# Patient Record
Sex: Male | Born: 1935
Health system: Southern US, Community
[De-identification: ages and names within clinical notes are randomized; demographics above are authoritative.]

## PROBLEM LIST (undated history)

## (undated) DIAGNOSIS — K219 Gastro-esophageal reflux disease without esophagitis: Secondary | ICD-10-CM

## (undated) DIAGNOSIS — M1990 Unspecified osteoarthritis, unspecified site: Secondary | ICD-10-CM

## (undated) DIAGNOSIS — I10 Essential (primary) hypertension: Secondary | ICD-10-CM

## (undated) DIAGNOSIS — J40 Bronchitis, not specified as acute or chronic: Secondary | ICD-10-CM

## (undated) DIAGNOSIS — N289 Disorder of kidney and ureter, unspecified: Secondary | ICD-10-CM

## (undated) DIAGNOSIS — I4891 Unspecified atrial fibrillation: Secondary | ICD-10-CM

## (undated) DIAGNOSIS — I509 Heart failure, unspecified: Secondary | ICD-10-CM

## (undated) DIAGNOSIS — E785 Hyperlipidemia, unspecified: Secondary | ICD-10-CM

## (undated) DIAGNOSIS — B029 Zoster without complications: Secondary | ICD-10-CM

## (undated) DIAGNOSIS — I2510 Atherosclerotic heart disease of native coronary artery without angina pectoris: Secondary | ICD-10-CM

## (undated) DIAGNOSIS — R252 Cramp and spasm: Secondary | ICD-10-CM

## (undated) DIAGNOSIS — E119 Type 2 diabetes mellitus without complications: Secondary | ICD-10-CM

## (undated) DIAGNOSIS — N189 Chronic kidney disease, unspecified: Secondary | ICD-10-CM

## (undated) HISTORY — DX: Atherosclerotic heart disease of native coronary artery without angina pectoris: I25.10

## (undated) HISTORY — DX: Essential (primary) hypertension: I10

## (undated) HISTORY — DX: Gastro-esophageal reflux disease without esophagitis: K21.9

## (undated) HISTORY — DX: Unspecified atrial fibrillation: I48.91

## (undated) HISTORY — DX: Cramp and spasm: R25.2

## (undated) HISTORY — DX: Bronchitis, not specified as acute or chronic: J40

## (undated) HISTORY — DX: Type 2 diabetes mellitus without complications: E11.9

## (undated) HISTORY — DX: Unspecified osteoarthritis, unspecified site: M19.90

## (undated) HISTORY — DX: Hyperlipidemia, unspecified: E78.5

## (undated) HISTORY — DX: Chronic kidney disease, unspecified: N18.9

## (undated) HISTORY — DX: Heart failure, unspecified: I50.9

## (undated) HISTORY — DX: Zoster without complications: B02.9

## (undated) HISTORY — PX: EYE SURGERY: SHX253

## (undated) HISTORY — DX: Disorder of kidney and ureter, unspecified: N28.9

---

## 1999-12-06 ENCOUNTER — Encounter: Payer: Self-pay | Admitting: Internal Medicine

## 1999-12-06 ENCOUNTER — Inpatient Hospital Stay (HOSPITAL_COMMUNITY): Admission: EM | Admit: 1999-12-06 | Discharge: 1999-12-09 | Payer: Self-pay | Admitting: Emergency Medicine

## 2005-01-23 ENCOUNTER — Inpatient Hospital Stay (HOSPITAL_COMMUNITY): Admission: EM | Admit: 2005-01-23 | Discharge: 2005-01-29 | Payer: Self-pay | Admitting: Emergency Medicine

## 2005-01-23 ENCOUNTER — Ambulatory Visit: Payer: Self-pay | Admitting: Cardiovascular Disease

## 2005-01-23 ENCOUNTER — Encounter (INDEPENDENT_AMBULATORY_CARE_PROVIDER_SITE_OTHER): Payer: Self-pay | Admitting: Interventional Cardiology

## 2005-01-29 ENCOUNTER — Encounter: Payer: Self-pay | Admitting: Cardiology

## 2005-02-02 ENCOUNTER — Ambulatory Visit: Payer: Self-pay | Admitting: Cardiovascular Disease

## 2005-02-19 ENCOUNTER — Ambulatory Visit: Payer: Self-pay | Admitting: Cardiology

## 2005-03-31 ENCOUNTER — Ambulatory Visit: Payer: Self-pay | Admitting: Internal Medicine

## 2005-04-07 ENCOUNTER — Ambulatory Visit: Payer: Self-pay | Admitting: Cardiovascular Disease

## 2005-04-20 HISTORY — PX: MITRAL VALVE REPAIR: SHX2039

## 2005-05-18 ENCOUNTER — Inpatient Hospital Stay (HOSPITAL_COMMUNITY): Admission: RE | Admit: 2005-05-18 | Discharge: 2005-05-25 | Payer: Self-pay | Admitting: Surgery

## 2005-05-18 HISTORY — PX: CORONARY ARTERY BYPASS GRAFT: SHX141

## 2005-06-05 ENCOUNTER — Ambulatory Visit: Payer: Self-pay | Admitting: Cardiovascular Disease

## 2005-06-24 ENCOUNTER — Ambulatory Visit: Payer: Self-pay | Admitting: Internal Medicine

## 2005-07-08 ENCOUNTER — Ambulatory Visit: Payer: Self-pay | Admitting: Cardiology

## 2005-07-17 ENCOUNTER — Ambulatory Visit: Payer: Self-pay | Admitting: Cardiovascular Disease

## 2005-07-23 ENCOUNTER — Ambulatory Visit: Payer: Self-pay | Admitting: Cardiovascular Disease

## 2005-07-30 ENCOUNTER — Ambulatory Visit: Payer: Self-pay | Admitting: Internal Medicine

## 2005-07-31 ENCOUNTER — Ambulatory Visit (HOSPITAL_COMMUNITY): Admission: RE | Admit: 2005-07-31 | Discharge: 2005-07-31 | Payer: Self-pay | Admitting: Cardiovascular Disease

## 2005-07-31 ENCOUNTER — Ambulatory Visit: Payer: Self-pay | Admitting: Internal Medicine

## 2005-08-07 ENCOUNTER — Ambulatory Visit: Payer: Self-pay | Admitting: Internal Medicine

## 2005-08-14 ENCOUNTER — Ambulatory Visit: Payer: Self-pay | Admitting: Cardiology

## 2005-08-21 ENCOUNTER — Ambulatory Visit: Payer: Self-pay | Admitting: Cardiology

## 2005-08-28 ENCOUNTER — Ambulatory Visit: Payer: Self-pay | Admitting: Internal Medicine

## 2005-09-04 ENCOUNTER — Ambulatory Visit: Payer: Self-pay | Admitting: Internal Medicine

## 2005-09-11 ENCOUNTER — Ambulatory Visit: Payer: Self-pay | Admitting: Cardiology

## 2005-09-28 ENCOUNTER — Ambulatory Visit: Payer: Self-pay | Admitting: Internal Medicine

## 2005-10-08 ENCOUNTER — Ambulatory Visit: Payer: Self-pay | Admitting: Cardiology

## 2005-10-15 ENCOUNTER — Ambulatory Visit: Payer: Self-pay | Admitting: Internal Medicine

## 2005-10-22 ENCOUNTER — Ambulatory Visit: Payer: Self-pay | Admitting: Internal Medicine

## 2005-10-23 ENCOUNTER — Ambulatory Visit: Payer: Self-pay | Admitting: Internal Medicine

## 2005-10-30 ENCOUNTER — Ambulatory Visit: Payer: Self-pay | Admitting: Internal Medicine

## 2005-11-02 HISTORY — PX: CARDIOVERSION: SHX1299

## 2005-11-03 ENCOUNTER — Ambulatory Visit: Payer: Self-pay | Admitting: Cardiovascular Disease

## 2005-11-03 ENCOUNTER — Ambulatory Visit (HOSPITAL_COMMUNITY): Admission: RE | Admit: 2005-11-03 | Discharge: 2005-11-03 | Payer: Self-pay | Admitting: Cardiovascular Disease

## 2005-11-11 ENCOUNTER — Ambulatory Visit: Payer: Self-pay | Admitting: Cardiology

## 2005-12-25 ENCOUNTER — Ambulatory Visit: Payer: Self-pay | Admitting: Internal Medicine

## 2006-01-01 ENCOUNTER — Ambulatory Visit: Payer: Self-pay | Admitting: Internal Medicine

## 2006-01-28 ENCOUNTER — Ambulatory Visit: Payer: Self-pay | Admitting: Cardiovascular Disease

## 2006-02-11 ENCOUNTER — Ambulatory Visit: Payer: Self-pay | Admitting: Internal Medicine

## 2006-02-23 ENCOUNTER — Ambulatory Visit: Payer: Self-pay | Admitting: Internal Medicine

## 2006-05-25 ENCOUNTER — Ambulatory Visit: Payer: Self-pay | Admitting: Internal Medicine

## 2006-05-25 LAB — CONVERTED CEMR LAB
ALT: 16 units/L (ref 0–40)
GFR calc Af Amer: 64 mL/min
GFR calc non Af Amer: 53 mL/min
Hgb A1c MFr Bld: 5.7 % (ref 4.6–6.0)
Potassium: 4.1 meq/L (ref 3.5–5.1)
Sodium: 144 meq/L (ref 135–145)
Total CHOL/HDL Ratio: 3
Triglycerides: 87 mg/dL (ref 0–149)
VLDL: 17 mg/dL (ref 0–40)

## 2006-05-31 ENCOUNTER — Ambulatory Visit: Payer: Self-pay | Admitting: Internal Medicine

## 2006-07-31 IMAGING — CR DG CHEST 2V
2 series · 2 of 2 positions shown · non-contrast
Comparison: 01/25/05.

CLINICAL DATA: Mitral regurgitation.  Coronary artery disease.
 CHEST - 2 VIEW:

[view not recorded (1 of 2)]
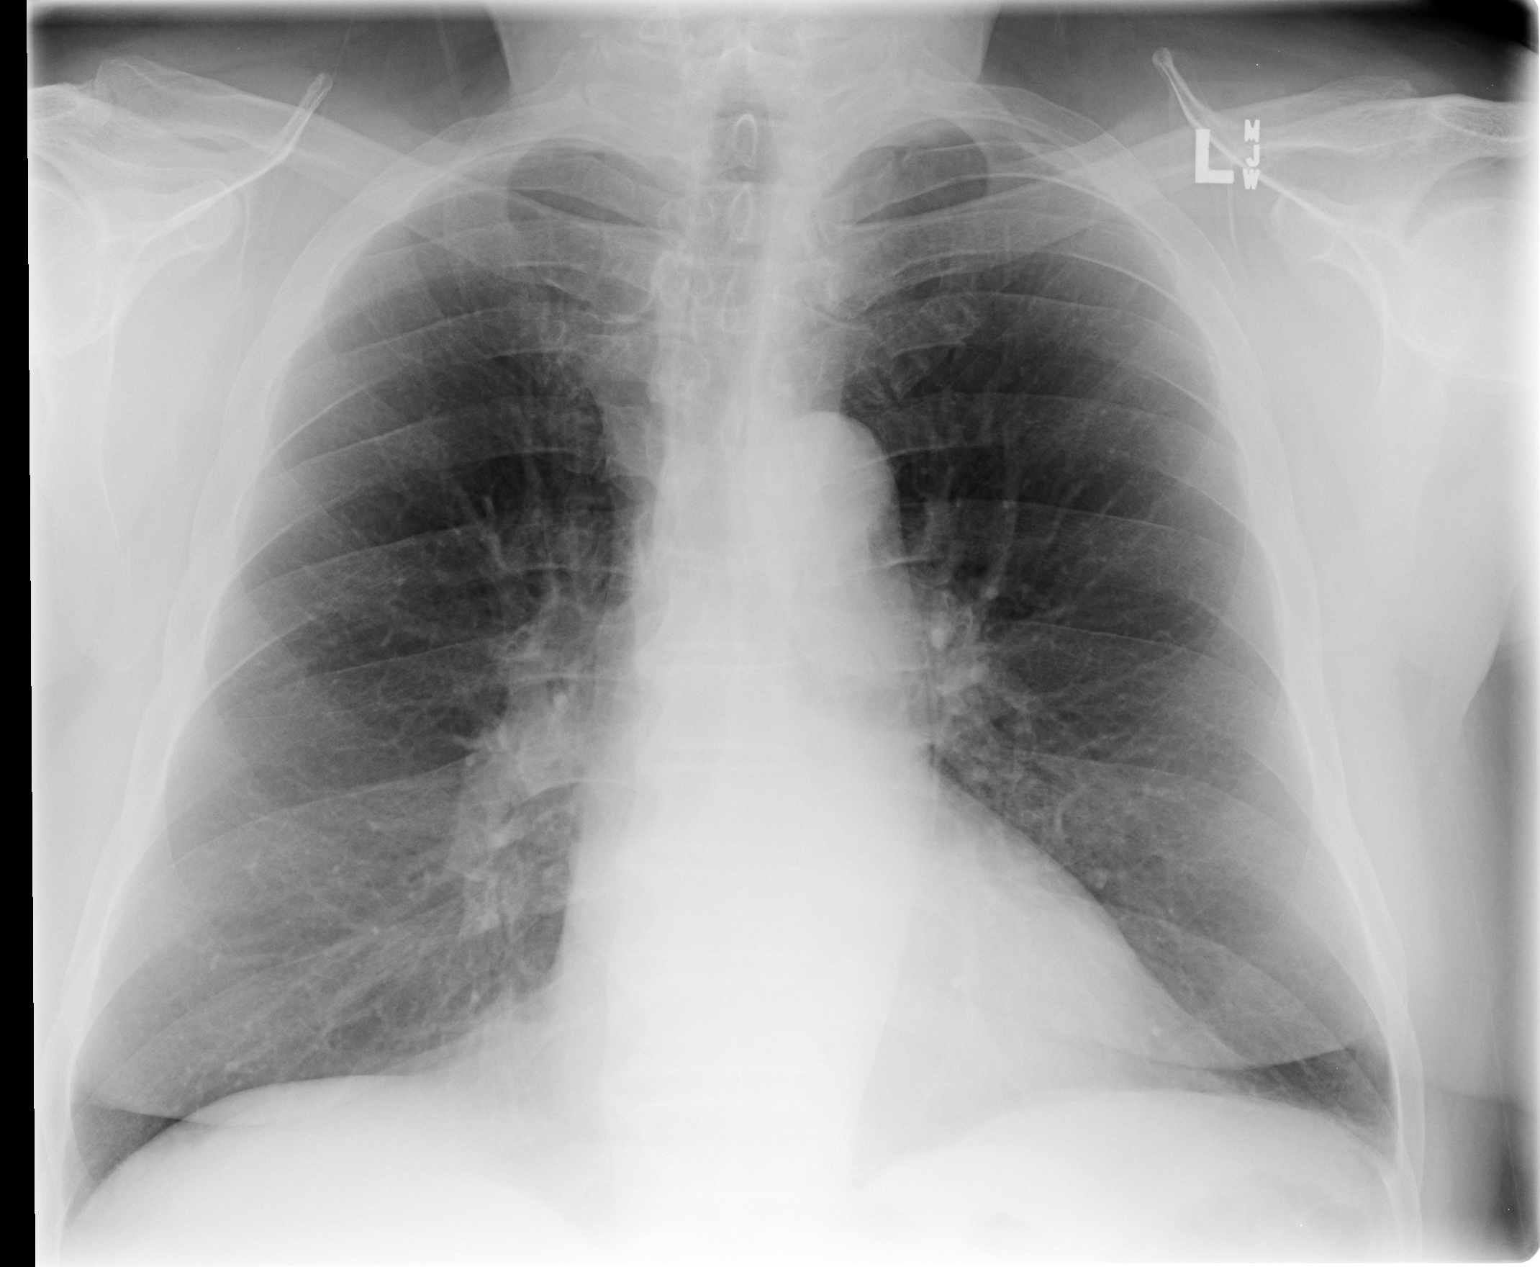

[view not recorded (2 of 2)]
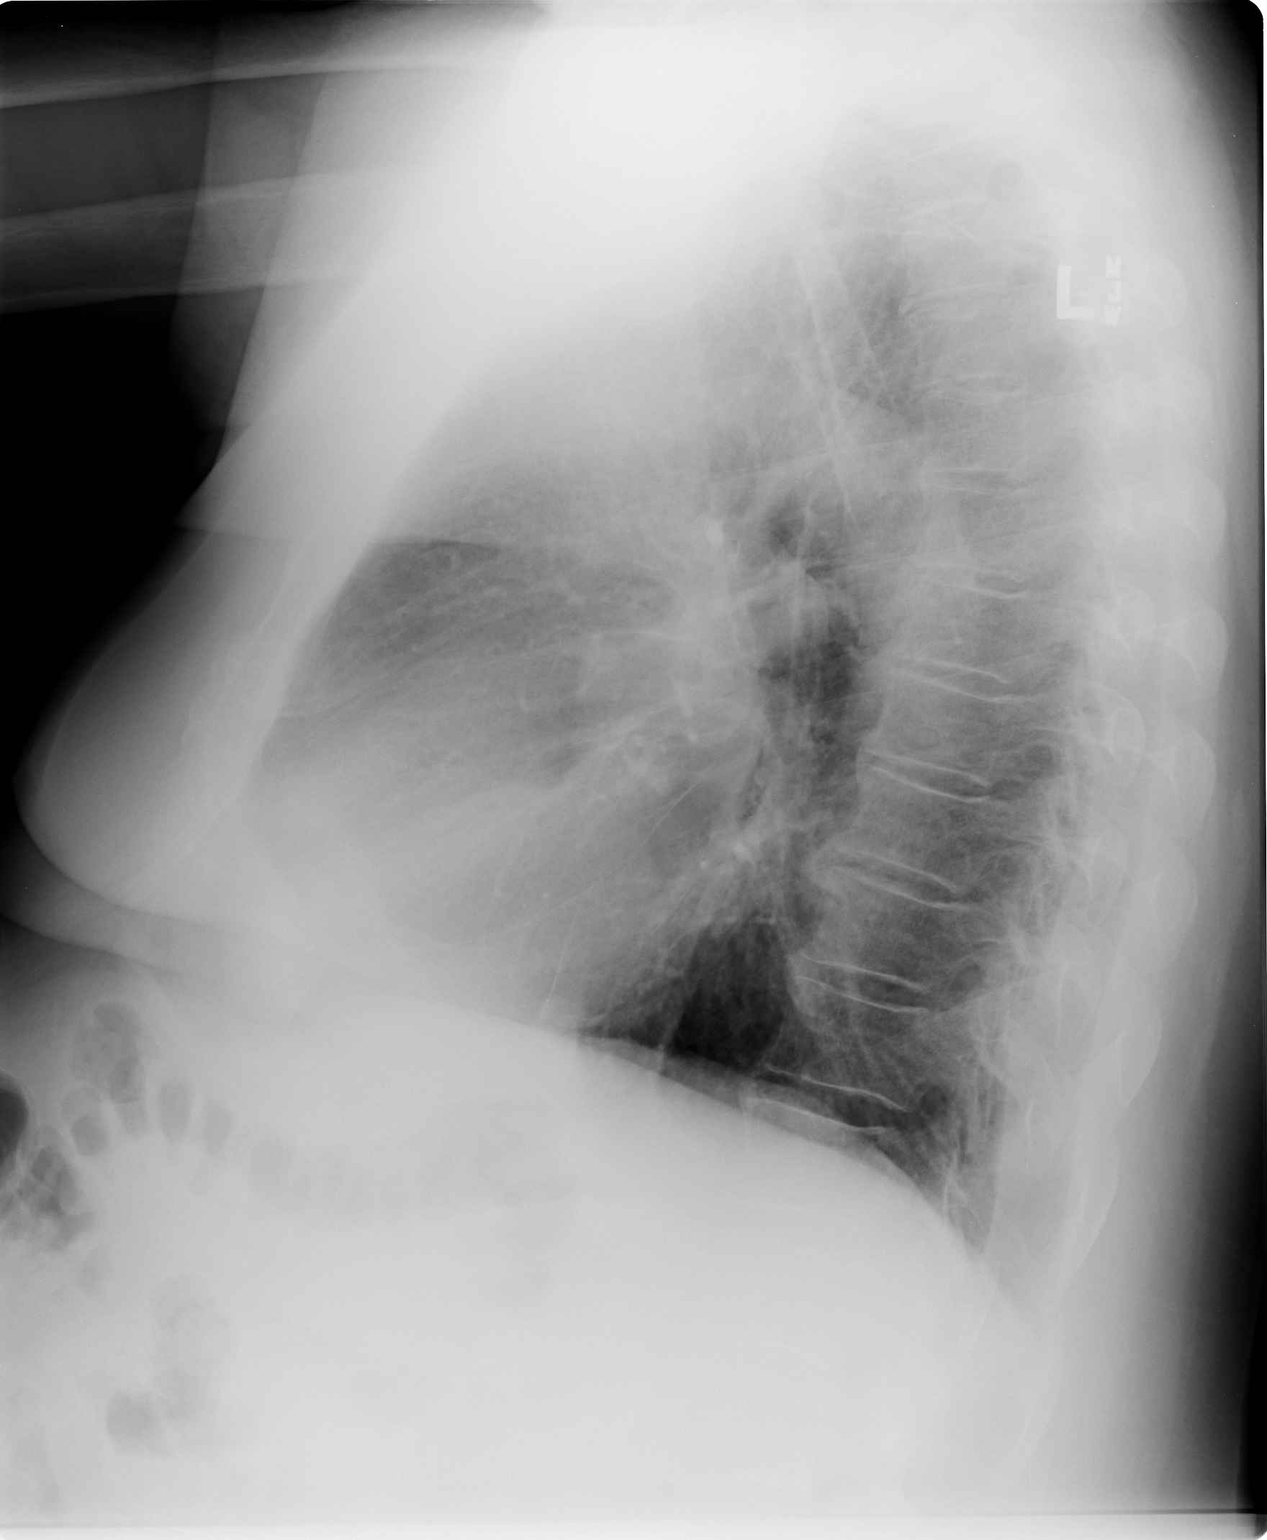

[2 of 2 positions shown; findings below may reference images not displayed]

FINDINGS: There are changes of COPD with hyperinflation.  There is no infiltrate, effusion, or mass.  There is no heart failure.
IMPRESSION: COPD.  No active disease.

## 2006-08-03 ENCOUNTER — Ambulatory Visit: Payer: Self-pay | Admitting: Internal Medicine

## 2006-10-05 ENCOUNTER — Ambulatory Visit: Payer: Self-pay | Admitting: Internal Medicine

## 2007-01-05 ENCOUNTER — Ambulatory Visit: Payer: Self-pay | Admitting: Internal Medicine

## 2007-01-05 LAB — CONVERTED CEMR LAB
ALT: 20 units/L (ref 0–53)
Basophils Absolute: 0.1 10*3/uL (ref 0.0–0.1)
Chloride: 108 meq/L (ref 96–112)
Creatinine, Ser: 1.3 mg/dL (ref 0.4–1.5)
Creatinine,U: 66.1 mg/dL
Eosinophils Absolute: 0.4 10*3/uL (ref 0.0–0.6)
GFR calc Af Amer: 69 mL/min
HCT: 41.7 % (ref 39.0–52.0)
HDL: 38.1 mg/dL — ABNORMAL LOW (ref >39.0)
Hemoglobin: 14.4 g/dL (ref 13.0–17.0)
MCV: 90.9 fL (ref 78.0–100.0)
Microalb Creat Ratio: 30.3 mg/g — ABNORMAL HIGH (ref 0.0–30.0)
Microalb, Ur: 2 mg/dL — ABNORMAL HIGH (ref 0.0–1.9)
Monocytes Relative: 7.8 % (ref 3.0–11.0)
Neutro Abs: 4.9 10*3/uL (ref 1.4–7.7)
Platelets: 214 10*3/uL (ref 150–400)
RBC: 4.59 M/uL (ref 4.22–5.81)
TSH: 1.38 microintl units/mL (ref 0.35–5.50)
WBC: 7.9 10*3/uL (ref 4.5–10.5)

## 2007-03-23 DIAGNOSIS — E118 Type 2 diabetes mellitus with unspecified complications: Secondary | ICD-10-CM

## 2007-03-23 DIAGNOSIS — N189 Chronic kidney disease, unspecified: Secondary | ICD-10-CM

## 2007-03-23 DIAGNOSIS — I2510 Atherosclerotic heart disease of native coronary artery without angina pectoris: Secondary | ICD-10-CM

## 2007-03-23 DIAGNOSIS — K219 Gastro-esophageal reflux disease without esophagitis: Secondary | ICD-10-CM

## 2007-03-23 DIAGNOSIS — M109 Gout, unspecified: Secondary | ICD-10-CM

## 2007-03-23 DIAGNOSIS — I10 Essential (primary) hypertension: Secondary | ICD-10-CM | POA: Insufficient documentation

## 2007-03-23 DIAGNOSIS — M1990 Unspecified osteoarthritis, unspecified site: Secondary | ICD-10-CM | POA: Insufficient documentation

## 2007-03-23 DIAGNOSIS — I5032 Chronic diastolic (congestive) heart failure: Secondary | ICD-10-CM

## 2007-03-23 DIAGNOSIS — B029 Zoster without complications: Secondary | ICD-10-CM | POA: Insufficient documentation

## 2007-05-03 ENCOUNTER — Ambulatory Visit: Payer: Self-pay | Admitting: Internal Medicine

## 2007-05-03 DIAGNOSIS — E782 Mixed hyperlipidemia: Secondary | ICD-10-CM

## 2007-07-04 ENCOUNTER — Ambulatory Visit: Payer: Self-pay | Admitting: Internal Medicine

## 2007-07-05 ENCOUNTER — Ambulatory Visit: Payer: Self-pay | Admitting: Internal Medicine

## 2007-07-05 DIAGNOSIS — J40 Bronchitis, not specified as acute or chronic: Secondary | ICD-10-CM

## 2007-07-05 LAB — CONVERTED CEMR LAB
CO2: 30 meq/L (ref 19–32)
Creatinine, Ser: 1.4 mg/dL (ref 0.4–1.5)
GFR calc Af Amer: 64 mL/min
GFR calc non Af Amer: 53 mL/min
Potassium: 4.6 meq/L (ref 3.5–5.1)

## 2007-09-29 ENCOUNTER — Ambulatory Visit: Payer: Self-pay | Admitting: Internal Medicine

## 2007-09-29 LAB — CONVERTED CEMR LAB
BUN: 24 mg/dL — ABNORMAL HIGH (ref 6–23)
CO2: 27 meq/L (ref 19–32)
Chloride: 106 meq/L (ref 96–112)
Creatinine, Ser: 1.3 mg/dL (ref 0.4–1.5)
GFR calc Af Amer: 69 mL/min
Glucose, Bld: 123 mg/dL — ABNORMAL HIGH (ref 70–99)
HDL: 35.5 mg/dL — ABNORMAL LOW (ref >39.0)
Hgb A1c MFr Bld: 6.7 % — ABNORMAL HIGH (ref 4.6–6.0)
LDL Cholesterol: 58 mg/dL (ref 0–99)
Potassium: 4.2 meq/L (ref 3.5–5.1)
Sodium: 141 meq/L (ref 135–145)
TSH: 0.92 microintl units/mL (ref 0.35–5.50)

## 2008-01-26 ENCOUNTER — Ambulatory Visit: Payer: Self-pay | Admitting: Internal Medicine

## 2008-05-21 ENCOUNTER — Ambulatory Visit: Payer: Self-pay | Admitting: Internal Medicine

## 2008-05-21 ENCOUNTER — Telehealth: Payer: Self-pay | Admitting: Internal Medicine

## 2008-05-21 LAB — CONVERTED CEMR LAB
CO2: 32 meq/L (ref 19–32)
Chloride: 102 meq/L (ref 96–112)
Glucose, Bld: 131 mg/dL — ABNORMAL HIGH (ref 70–99)
Hgb A1c MFr Bld: 6.7 % — ABNORMAL HIGH (ref 4.6–6.0)
Microalb Creat Ratio: 20.3 mg/g (ref 0.0–30.0)
Microalb, Ur: 1.5 mg/dL (ref 0.0–1.9)
Potassium: 5.2 meq/L — ABNORMAL HIGH (ref 3.5–5.1)
Sodium: 140 meq/L (ref 135–145)

## 2008-05-25 ENCOUNTER — Encounter: Payer: Self-pay | Admitting: Internal Medicine

## 2008-05-31 ENCOUNTER — Ambulatory Visit: Payer: Self-pay | Admitting: Internal Medicine

## 2008-05-31 DIAGNOSIS — R252 Cramp and spasm: Secondary | ICD-10-CM

## 2008-06-06 ENCOUNTER — Encounter: Payer: Self-pay | Admitting: Internal Medicine

## 2008-06-06 ENCOUNTER — Ambulatory Visit: Payer: Self-pay

## 2008-07-25 ENCOUNTER — Telehealth: Payer: Self-pay | Admitting: Internal Medicine

## 2008-08-20 ENCOUNTER — Ambulatory Visit: Payer: Self-pay | Admitting: Internal Medicine

## 2008-08-20 ENCOUNTER — Telehealth: Payer: Self-pay | Admitting: Internal Medicine

## 2008-08-20 LAB — CONVERTED CEMR LAB
AST: 19 units/L (ref 0–37)
BUN: 24 mg/dL — ABNORMAL HIGH (ref 6–23)
Calcium: 9.5 mg/dL (ref 8.4–10.5)
Cholesterol: 118 mg/dL (ref 0–200)
Creatinine, Ser: 1.5 mg/dL (ref 0.4–1.5)
Glucose, Bld: 118 mg/dL — ABNORMAL HIGH (ref 70–99)
Hgb A1c MFr Bld: 6.4 % (ref 4.6–6.5)
LDL Cholesterol: 54 mg/dL (ref 0–99)
Potassium: 4.2 meq/L (ref 3.5–5.1)
Sodium: 142 meq/L (ref 135–145)
Total CHOL/HDL Ratio: 3
VLDL: 29.4 mg/dL (ref 0.0–40.0)

## 2008-08-28 ENCOUNTER — Ambulatory Visit: Payer: Self-pay | Admitting: Internal Medicine

## 2009-02-28 ENCOUNTER — Ambulatory Visit: Payer: Self-pay | Admitting: Internal Medicine

## 2009-02-28 LAB — CONVERTED CEMR LAB
Calcium: 9.9 mg/dL (ref 8.4–10.5)
Creatinine, Urine: 76.8 mg/dL
Glucose, Bld: 125 mg/dL — ABNORMAL HIGH (ref 70–99)
Microalb Creat Ratio: 22.4 mg/g (ref 0.0–30.0)
Microalb, Ur: 1.72 mg/dL (ref 0.00–1.89)
Sodium: 141 meq/L (ref 135–145)

## 2009-03-03 ENCOUNTER — Encounter: Payer: Self-pay | Admitting: Internal Medicine

## 2009-08-20 ENCOUNTER — Ambulatory Visit: Payer: Self-pay | Admitting: Internal Medicine

## 2009-08-20 ENCOUNTER — Encounter (INDEPENDENT_AMBULATORY_CARE_PROVIDER_SITE_OTHER): Payer: Self-pay | Admitting: *Deleted

## 2009-08-20 DIAGNOSIS — I4891 Unspecified atrial fibrillation: Secondary | ICD-10-CM

## 2009-08-21 ENCOUNTER — Encounter: Payer: Self-pay | Admitting: Internal Medicine

## 2009-08-22 ENCOUNTER — Encounter: Payer: Self-pay | Admitting: Internal Medicine

## 2009-08-22 LAB — CONVERTED CEMR LAB: Prothrombin Time: 14.9 s (ref 11.6–15.2)

## 2009-08-23 ENCOUNTER — Telehealth: Payer: Self-pay | Admitting: Internal Medicine

## 2009-08-27 LAB — CONVERTED CEMR LAB
INR: 2.3 — ABNORMAL HIGH (ref <1.50)
Prothrombin Time: 25.1 s — ABNORMAL HIGH (ref 11.6–15.2)

## 2009-08-28 ENCOUNTER — Telehealth: Payer: Self-pay | Admitting: Internal Medicine

## 2009-08-29 ENCOUNTER — Ambulatory Visit: Payer: Self-pay | Admitting: Cardiovascular Disease

## 2009-08-29 DIAGNOSIS — Z9889 Other specified postprocedural states: Secondary | ICD-10-CM

## 2009-09-04 ENCOUNTER — Encounter: Payer: Self-pay | Admitting: Internal Medicine

## 2009-09-12 ENCOUNTER — Telehealth: Payer: Self-pay | Admitting: Internal Medicine

## 2009-09-12 ENCOUNTER — Ambulatory Visit: Payer: Self-pay | Admitting: Cardiology

## 2009-09-12 LAB — CONVERTED CEMR LAB: POC INR: 2.3

## 2009-09-19 ENCOUNTER — Ambulatory Visit: Payer: Self-pay | Admitting: Internal Medicine

## 2009-09-19 ENCOUNTER — Encounter (INDEPENDENT_AMBULATORY_CARE_PROVIDER_SITE_OTHER): Payer: Self-pay | Admitting: *Deleted

## 2009-09-19 ENCOUNTER — Ambulatory Visit (HOSPITAL_COMMUNITY): Admission: RE | Admit: 2009-09-19 | Discharge: 2009-09-19 | Payer: Self-pay | Admitting: Cardiovascular Disease

## 2009-09-19 ENCOUNTER — Ambulatory Visit: Payer: Self-pay

## 2009-09-19 ENCOUNTER — Ambulatory Visit: Payer: Self-pay | Admitting: Cardiovascular Disease

## 2009-09-19 LAB — CONVERTED CEMR LAB: POC INR: 2.2

## 2009-09-23 ENCOUNTER — Ambulatory Visit: Payer: Self-pay | Admitting: Internal Medicine

## 2009-09-26 ENCOUNTER — Ambulatory Visit: Payer: Self-pay | Admitting: Internal Medicine

## 2009-09-26 LAB — CONVERTED CEMR LAB: POC INR: 2.6

## 2009-10-01 ENCOUNTER — Ambulatory Visit: Payer: Self-pay | Admitting: Internal Medicine

## 2009-10-01 ENCOUNTER — Ambulatory Visit: Payer: Self-pay | Admitting: Cardiovascular Disease

## 2009-10-01 DIAGNOSIS — R0609 Other forms of dyspnea: Secondary | ICD-10-CM

## 2009-10-01 DIAGNOSIS — R0989 Other specified symptoms and signs involving the circulatory and respiratory systems: Secondary | ICD-10-CM

## 2009-10-01 LAB — CONVERTED CEMR LAB: POC INR: 3.5

## 2009-10-03 ENCOUNTER — Ambulatory Visit (HOSPITAL_COMMUNITY): Admission: RE | Admit: 2009-10-03 | Discharge: 2009-10-03 | Payer: Self-pay | Admitting: Cardiovascular Disease

## 2009-10-03 ENCOUNTER — Ambulatory Visit: Payer: Self-pay | Admitting: Cardiovascular Disease

## 2009-10-08 ENCOUNTER — Encounter: Payer: Self-pay | Admitting: Cardiovascular Disease

## 2009-10-10 ENCOUNTER — Ambulatory Visit: Payer: Self-pay | Admitting: Cardiovascular Disease

## 2009-10-10 ENCOUNTER — Telehealth: Payer: Self-pay | Admitting: Internal Medicine

## 2009-10-10 ENCOUNTER — Ambulatory Visit: Payer: Self-pay | Admitting: Internal Medicine

## 2009-10-10 LAB — CONVERTED CEMR LAB: POC INR: 2.6

## 2009-10-14 ENCOUNTER — Telehealth: Payer: Self-pay | Admitting: Internal Medicine

## 2009-10-18 ENCOUNTER — Encounter: Payer: Self-pay | Admitting: Internal Medicine

## 2009-10-30 ENCOUNTER — Encounter: Payer: Self-pay | Admitting: Internal Medicine

## 2009-11-07 ENCOUNTER — Ambulatory Visit: Payer: Self-pay | Admitting: Cardiovascular Disease

## 2009-11-07 ENCOUNTER — Encounter: Payer: Self-pay | Admitting: Cardiology

## 2009-11-07 ENCOUNTER — Ambulatory Visit: Payer: Self-pay | Admitting: Cardiology

## 2009-11-07 ENCOUNTER — Ambulatory Visit (HOSPITAL_COMMUNITY): Admission: RE | Admit: 2009-11-07 | Discharge: 2009-11-07 | Payer: Self-pay | Admitting: Cardiovascular Disease

## 2009-11-08 LAB — CONVERTED CEMR LAB
ALT: 17 units/L (ref 0–53)
Albumin: 4.5 g/dL (ref 3.5–5.2)
BUN: 29 mg/dL — ABNORMAL HIGH (ref 6–23)
CO2: 31 meq/L (ref 19–32)
Calcium: 9.6 mg/dL (ref 8.4–10.5)
Creatinine, Ser: 1.5 mg/dL (ref 0.4–1.5)
Glucose, Bld: 136 mg/dL — ABNORMAL HIGH (ref 70–99)
Potassium: 5.6 meq/L — ABNORMAL HIGH (ref 3.5–5.1)
Sodium: 146 meq/L — ABNORMAL HIGH (ref 135–145)
Total Protein: 7.7 g/dL (ref 6.0–8.3)

## 2009-11-11 ENCOUNTER — Ambulatory Visit: Payer: Self-pay | Admitting: Cardiovascular Disease

## 2009-11-15 LAB — CONVERTED CEMR LAB
Calcium: 8.9 mg/dL (ref 8.4–10.5)
Creatinine, Ser: 1.6 mg/dL — ABNORMAL HIGH (ref 0.4–1.5)
Potassium: 4.5 meq/L (ref 3.5–5.1)

## 2009-11-21 ENCOUNTER — Ambulatory Visit: Payer: Self-pay | Admitting: Internal Medicine

## 2009-11-21 ENCOUNTER — Ambulatory Visit: Payer: Self-pay | Admitting: Cardiovascular Disease

## 2009-11-21 LAB — CONVERTED CEMR LAB: POC INR: 3.5

## 2009-12-12 ENCOUNTER — Ambulatory Visit: Payer: Self-pay | Admitting: Internal Medicine

## 2009-12-16 ENCOUNTER — Encounter: Payer: Self-pay | Admitting: Internal Medicine

## 2009-12-16 ENCOUNTER — Ambulatory Visit (HOSPITAL_BASED_OUTPATIENT_CLINIC_OR_DEPARTMENT_OTHER): Admission: RE | Admit: 2009-12-16 | Discharge: 2009-12-16 | Payer: Self-pay | Admitting: Internal Medicine

## 2009-12-27 ENCOUNTER — Ambulatory Visit: Payer: Self-pay | Admitting: Pulmonary Disease

## 2010-01-03 ENCOUNTER — Telehealth: Payer: Self-pay | Admitting: Internal Medicine

## 2010-01-03 DIAGNOSIS — G4733 Obstructive sleep apnea (adult) (pediatric): Secondary | ICD-10-CM

## 2010-01-06 ENCOUNTER — Encounter (INDEPENDENT_AMBULATORY_CARE_PROVIDER_SITE_OTHER): Payer: Self-pay | Admitting: *Deleted

## 2010-01-09 ENCOUNTER — Ambulatory Visit: Payer: Self-pay | Admitting: Internal Medicine

## 2010-01-09 ENCOUNTER — Telehealth: Payer: Self-pay | Admitting: Internal Medicine

## 2010-02-06 ENCOUNTER — Ambulatory Visit: Payer: Self-pay | Admitting: Internal Medicine

## 2010-02-06 ENCOUNTER — Telehealth: Payer: Self-pay | Admitting: Internal Medicine

## 2010-02-13 ENCOUNTER — Ambulatory Visit: Payer: Self-pay | Admitting: Internal Medicine

## 2010-02-13 LAB — CONVERTED CEMR LAB
BUN: 25 mg/dL — ABNORMAL HIGH (ref 6–23)
Calcium: 9.3 mg/dL (ref 8.4–10.5)
Creatinine, Ser: 1.7 mg/dL — ABNORMAL HIGH (ref 0.40–1.50)
Glucose, Bld: 128 mg/dL — ABNORMAL HIGH (ref 70–99)
Hgb A1c MFr Bld: 6.6 % — ABNORMAL HIGH (ref <5.7)
Potassium: 4.8 meq/L (ref 3.5–5.3)
Sodium: 140 meq/L (ref 135–145)

## 2010-02-14 ENCOUNTER — Encounter: Payer: Self-pay | Admitting: Internal Medicine

## 2010-02-14 LAB — CONVERTED CEMR LAB: TSH: 1.791 microintl units/mL (ref 0.350–4.500)

## 2010-02-28 ENCOUNTER — Telehealth: Payer: Self-pay | Admitting: Internal Medicine

## 2010-03-06 ENCOUNTER — Ambulatory Visit: Payer: Self-pay | Admitting: Cardiology

## 2010-03-06 LAB — CONVERTED CEMR LAB: POC INR: 2.5

## 2010-04-02 ENCOUNTER — Telehealth: Payer: Self-pay | Admitting: Internal Medicine

## 2010-04-03 ENCOUNTER — Ambulatory Visit: Payer: Self-pay | Admitting: Cardiovascular Disease

## 2010-04-03 LAB — CONVERTED CEMR LAB: POC INR: 4

## 2010-04-24 ENCOUNTER — Ambulatory Visit: Admission: RE | Admit: 2010-04-24 | Discharge: 2010-04-24 | Payer: Self-pay | Source: Home / Self Care

## 2010-04-24 LAB — CONVERTED CEMR LAB: POC INR: 2

## 2010-05-05 ENCOUNTER — Telehealth: Payer: Self-pay | Admitting: Cardiovascular Disease

## 2010-05-08 ENCOUNTER — Ambulatory Visit: Admission: RE | Admit: 2010-05-08 | Discharge: 2010-05-08 | Payer: Self-pay | Source: Home / Self Care

## 2010-05-08 LAB — CONVERTED CEMR LAB: POC INR: 3.2

## 2010-05-18 LAB — CONVERTED CEMR LAB
CO2: 24 meq/L (ref 19–32)
Creatinine, Ser: 1.33 mg/dL (ref 0.40–1.50)
Free T4: 1.15 ng/dL (ref 0.80–1.80)
Glucose, Bld: 145 mg/dL — ABNORMAL HIGH (ref 70–99)
Potassium: 4.7 meq/L (ref 3.5–5.3)
Sodium: 139 meq/L (ref 135–145)

## 2010-05-20 NOTE — Progress Notes (Signed)
Summary: Coumadin Change  Phone Note Outgoing Call   Summary of Call: call pt -  I suggest he take 2 tabs of 4 mg of coumadin x 3 days then resume one daily.  needs INR on Mon or Tues Initial call taken by: D. Thomos Lemons DO,  Aug 23, 2009 8:02 AM  Follow-up for Phone Call        attempted to contact patient at [phone], wife Dennie Bible states patient was not available she takes his messages and manages his medication for, because he has trouble with his hearing. She was advised per Dr Artist Pais instructions, and was asked to have the patient call back to confirm that he has recieved this message. She states she will give him the information provided. Follow-up by: [name] CMA,  Aug 23, 2009 8:32 AM

## 2010-05-20 NOTE — Medication Information (Signed)
Summary: rov/ewj  Anticoagulant Therapy  Managed by: Weston Brass, PharmD Referring MD: Eden Emms PCP: Dondra Spry DO Supervising MD: Jens Som MD, Arlys John Indication 1: Atrial Fibrillation Lab Used: [facility] [place] Site: [address] INR POC 2.5 INR RANGE 2.5-3.0  Dietary changes: no    Health status changes: no    Bleeding/hemorrhagic complications: no    Recent/future hospitalizations: no    Any changes in medication regimen? no    Recent/future dental: no  Any missed doses?: no       Is patient compliant with meds? yes       Allergies: No Known Drug Allergies  Anticoagulation Management History:      The patient is taking warfarin and comes in today for a routine follow up visit.  Positive risk factors for bleeding include an age of 75 years or older and presence of serious comorbidities.  The bleeding index is 'intermediate risk'.  Positive CHADS2 values include History of CHF, History of HTN, Age > 57 years old, and History of Diabetes.  His last INR was 2.30.  Anticoagulation responsible provider: Jens Som MD, Arlys John.  INR POC: 2.5.  Cuvette Lot#: [id number].  Exp: 02/2011.    Anticoagulation Management Assessment/Plan:      The patient's current anticoagulation dose is Coumadin 4 mg tabs: Take as directed by [facility]madin Clinic (up to 1 1/2 tablets daily).  The target INR is 2.5-3.0.  The next INR is due 04/03/2010.  Anticoagulation instructions were given to patient.  Results were reviewed/authorized by Weston Brass, PharmD.  He was notified by Hoy Register, PharmD Candidate.         Prior Anticoagulation Instructions: INR 2.6  Continue on same dosahe 1 tablet daily except 1.5 tablets on Thursdays.  Recheck in 4 weeks.    Current Anticoagulation Instructions: INR 2.5 Continue previous dose of 1 tablet everyday except 1.5 tablet on Thursday Recheck INR in 4 weeks

## 2010-05-20 NOTE — Assessment & Plan Note (Signed)
Summary: ROV/POST CARDIOVERSION/DM  Medications Added AMIODARONE HCL 200 MG TABS (AMIODARONE HCL) Take one tablet by mouth twice a day      Allergies Added: NKDA  Visit Type:  rov Primary Provider:  Dondra Spry DO  CC:  no cardiac complaints today.  History of Present Illness: Javier [name] is seen today at the request of Dr Artist Pais.  He is a new patient not being seen in cardiology since 2007  He has a history of mitral valve repair with 2V  bypass in October, 2006.  This included LIMA to LAD and SVG to D2   He has had PAF.  He has hypertension, type 2 diabetes  He had been on pacerone in 2007 and had [facility] x1.  He was found to be back in afib by Dr Artist Pais on 2022/09/20.  He was started on coumadin and referred for eval.  He had successful [facility] on 6/21.  His INR is Rx today at 2.6.  ECG confirms NSR.  He has less SOB, and no SSCP.    Current Problems (verified): 1)  Snoring  (ICD-786.09) 2)  Coumadin Therapy  (ICD-V58.61) 3)  Mitral Valve Replacement, Hx of  (ICD-V15.1) 4)  Coronary Artery Disease  (ICD-414.00) 5)  Congestive Heart Failure  (ICD-428.0) 6)  Fibrillation, Atrial  (ICD-427.31) 7)  Hypertension  (ICD-401.9) 8)  Hyperlipidemia  (ICD-272.2) 9)  Leg Cramps  (ICD-729.82) 10)  Bronchitis  (ICD-490) 11)  Shingles  (ICD-053.9) 12)  Renal Insufficiency, Chronic  (ICD-585.9) 13)  Osteoarthritis  (ICD-715.90) 14)  Gout  (ICD-274.9) 15)  Gerd  (ICD-530.81) 16)  Diabetes Mellitus, Type II  (ICD-250.00)  Current Medications (verified): 1)  Amlodipine Besylate 10 Mg Tabs (Amlodipine Besylate) .Marland Kitchen.. 1 Tablet By Mouth Every Morning 2)  Coreg 6.25 Mg  Tabs (Carvedilol) .... One By Mouth Bid 3)  Folic Acid 1 Mg Tabs (Folic Acid) .... Take 1 Tablet By Mouth Once A Day 4)  Lisinopril-Hydrochlorothiazide 20-25 Mg  Tabs (Lisinopril-Hydrochlorothiazide) .... One By Mouth Once Daily 5)  Lovastatin 40 Mg Tabs (Lovastatin) .... Take 2  Tab By Mouth At Bedtime 6)  Colchicine 0.6 Mg Tabs (Colchicine) .... One By  Mouth Once Daily Prn 7)  Zostavax [id number] Unt/0.4ml Solr (Zoster Vaccine Live) .... Administer Vaccine X 1 8)  Coumadin 4 Mg Tabs (Warfarin Sodium) .... Take As Directed By Coumadin Clinic (Up To 1 1/2 Tablets Daily)  Allergies (verified): No Known Drug Allergies  Past History:  Past Medical History: Last updated: 08/26/2009 Current Problems:  CORONARY ARTERY DISEASE (ICD-414.00) CONGESTIVE HEART FAILURE (ICD-428.0) FIBRILLATION, ATRIAL (ICD-427.31) HYPERTENSION (ICD-401.9) HYPERLIPIDEMIA (ICD-272.2) LEG CRAMPS (ICD-729.82) BRONCHITIS (ICD-490) SHINGLES (ICD-053.9) RENAL INSUFFICIENCY, CHRONIC (ICD-585.9) OSTEOARTHRITIS (ICD-715.90) GOUT (ICD-274.9) GERD (ICD-530.81) DIABETES MELLITUS, TYPE II (ICD-250.00)  Past Surgical History: Last updated: 2009-09-19 S/P Mitral valve repair- 04/2005 S/P Cardioversion-11/02/2005  Coronary artery bypass graft-05/18/2005     Family History: Last updated: 09-19-2009 Mother deceased at age 20, secondary to motor vehicle accident.  Father deceased at age 61 with a cerebral hemorrhage.  Patient has 2 brothers and 2 sisters.  There is diabetes in his siblings.     Social History: Last updated: 2009/09/19 Married but separated from wife Alcohol use-yes Former Smoker quit 40 yrs ago (40 pack yr history)  Works part time at [employer] near [employer]  Review of Systems       Denies fever, malais, weight loss, blurry vision, decreased visual acuity, cough, sputum, SOB, hemoptysis, pleuritic pain, palpitaitons, heartburn, abdominal pain, melena,  lower extremity edema, claudication, or rash.   Vital Signs:  Patient profile:   77 year old male Height:      67 inches Weight:      226 pounds BMI:     35.52 Pulse rate:   78 / minute Pulse rhythm:   regular BP sitting:   120 / 70  (left arm) Cuff size:   regular  Vitals Entered By: Danielle Rankin, CMA (October 10, 2009 2:59 PM)  Physical Exam  General:  Affect  appropriate Healthy:  appears stated age HEENT: normal Neck supple with no adenopathy JVP normal no bruits no thyromegaly Lungs clear with no wheezing and good diaphragmatic motion Heart:  S1/S2 sytolic  murmur no rub, gallop or click PMI normal Abdomen: benighn, BS positve, no tenderness, no AAA no bruit.  No HSM or HJR Distal pulses intact with no bruits No edema Neuro non-focal Skin warm and dry    Impression & Recommendations:  Problem # 1:  COUMADIN THERAPY (ICD-V58.61) S/P DCC with Rx INR 2.6 will decrease dose slightly since he will be on amiodarone  Problem # 2:  FIBRILLATION, ATRIAL (ICD-427.31)  Frequent PAC's Given CAD will start amiodarone 200 two times a day to stabilize rhythm.  T4/TSH, LFT's and DLCO in 4 weeks Will adjust coumadin dose down slightly since amiodarone being started His updated medication list for this problem includes:    Coreg 6.25 Mg Tabs (Carvedilol) ..... One by mouth bid    Coumadin 4 Mg Tabs (Warfarin sodium) .Marland Kitchen... Take as directed by coumadin clinic (up to 1 1/2 tablets daily)    Amiodarone Hcl 200 Mg Tabs (Amiodarone hcl) .Marland Kitchen... Take one tablet by mouth twice a day  Orders: Pulmonary Function Test (PFT)  Problem # 3:  MITRAL VALVE REPLACEMENT, HX OF (ICD-V15.1) Stable no significant MR on recent echo. SBE prophylaxis  Problem # 4:  CORONARY ARTERY DISEASE (ICD-414.00) S/P CABG no angina.  Continue BB His updated medication list for this problem includes:    Amlodipine Besylate 10 Mg Tabs (Amlodipine besylate) .Marland Kitchen... 1 tablet by mouth every morning    Coreg 6.25 Mg Tabs (Carvedilol) ..... One by mouth bid    Lisinopril-hydrochlorothiazide 20-25 Mg Tabs (Lisinopril-hydrochlorothiazide) ..... One by mouth once daily    Coumadin 4 Mg Tabs (Warfarin sodium) .Marland Kitchen... Take as directed by coumadin clinic (up to 1 1/2 tablets daily)  Problem # 5:  HYPERLIPIDEMIA (ICD-272.2) At goal with no side effects His updated medication list for this  problem includes:    Lovastatin 40 Mg Tabs (Lovastatin) .Marland Kitchen... Take 2  tab by mouth at bedtime  CHOL: 118 (08/20/2008)   LDL: 54 (08/20/2008)   HDL: 34.20 (08/20/2008)   TG: 147.0 (08/20/2008)  His updated medication list for this problem includes:    Lovastatin 40 Mg Tabs (Lovastatin) .Marland Kitchen... Take 2  tab by mouth at bedtime  Problem # 6:  HYPERTENSION (ICD-401.9)  Well contorlled His updated medication list for this problem includes:    Amlodipine Besylate 10 Mg Tabs (Amlodipine besylate) .Marland Kitchen... 1 tablet by mouth every morning    Coreg 6.25 Mg Tabs (Carvedilol) ..... One by mouth bid    Lisinopril-hydrochlorothiazide 20-25 Mg Tabs (Lisinopril-hydrochlorothiazide) ..... One by mouth once daily  His updated medication list for this problem includes:    Amlodipine Besylate 10 Mg Tabs (Amlodipine besylate) .Marland Kitchen... 1 tablet by mouth every morning    Coreg 6.25 Mg Tabs (Carvedilol) ..... One by mouth bid    Lisinopril-hydrochlorothiazide 20-25 Mg Tabs (  Lisinopril-hydrochlorothiazide) ..... One by mouth once daily  Patient Instructions: 1)  Your physician recommends that you schedule a follow-up appointment in: 2 MONTHS 2)  Your physician recommends that you return for lab work in:ONE MONTH-TSH/LIVER/BMP-427.31/V58.69-SCHEDULE IN 4 WEEKS 3)  Your physician has recommended you make the following change in your medication: START AMIODARONE 200MG  ONE TABLET TWICE DAILY 4)  Your physician has recommended that you have a pulmonary function test.  Pulmonary Function Tests are a group of tests that measure how well air moves in and out of your lungs.SCHEDULE IN 4 WEEKS Prescriptions: AMIODARONE HCL 200 MG TABS (AMIODARONE HCL) Take one tablet by mouth twice a day  #60 x 12   Entered by:   Deliah Goody, RN   Authorized by:   Colon Branch, MD, [facility]   Signed by:   Deliah Goody, RN on 10/10/2009   Method used:   Electronically to        [facility]* (retail)       [address]        [place], [place]  [address]       Ph: [phone]       Fax: [phone]   RxID:   [id number]  Reviewed script and accurate  Colon Branch, MD, [facility]  October 10, 2009 5:06 PM  Charlton Haws MD [facility]   Prevention & Chronic Care Immunizations   Influenza vaccine: Fluvax MCR  (02/28/2009)    Tetanus booster: Not documented    Pneumococcal vaccine: Pneumovax (Medicare)  (01/26/2008)    H. zoster vaccine: 09/04/2009: Zostavax  Colorectal Screening   Hemoccult: Not documented    Colonoscopy: Refused  (05/03/2007)  Other Screening   PSA: Not documented   Smoking status: quit  (08/20/2009)  Diabetes Mellitus   HgbA1C: 7.2  (08/20/2009)    Eye exam: normal  (12/31/2008)   Eye exam due: 12/2009    Foot exam: yes  (08/20/2009)   High risk foot: Not documented   Foot care education: Not documented    Urine microalbumin/creatinine ratio: 22.4  (02/28/2009)  Lipids   Total Cholesterol: 118  (08/20/2008)   LDL: 54  (08/20/2008)   LDL Direct: Not documented   HDL: 34.20  (08/20/2008)   Triglycerides: 147.0  (08/20/2008)    SGOT (AST): 19  (08/20/2008)   SGPT (ALT): 18  (08/20/2008)   Alkaline phosphatase: Not documented   Total bilirubin: Not documented  Hypertension   Last Blood Pressure: 120 / 70  (10/10/2009)   Serum creatinine: 1.33  (08/20/2009)   Serum potassium 4.7  (08/20/2009)  Self-Management Support :    Diabetes self-management support: Not documented    Hypertension self-management support: Not documented    Lipid self-management support: Not documented    EKG Report  Procedure date:  10/10/2009  Findings:      NSR Frequent PACs Rate 62 Otherwise normal

## 2010-05-20 NOTE — Progress Notes (Signed)
Summary: INR result  Phone Note Outgoing Call   Summary of Call: call pt - INR / thinness of blood is at goal.  I advise he take 1 tab of coumadin 4 mg until seen by cardiology.  they can recommend further adjustments Initial call taken by: D. Thomos Lemons DO,  Aug 28, 2009 8:24 AM  Follow-up for Phone Call        Left message with male to have pt return my call.  Mervin Kung CMA  Aug 28, 2009 9:09 AM   Notified pt per Dr. Olegario Messier instructions.  Pt voices understanding.  Mervin Kung CMA  Aug 28, 2009 10:35 AM

## 2010-05-20 NOTE — Assessment & Plan Note (Signed)
Summary: 6 month follow up/mhf   Vital Signs:  Patient profile:   77 year old male Height:      67 inches Weight:      225.50 pounds BMI:     35.45 O2 Sat:      98 % on Room air Temp:     97.3 degrees F oral Pulse rate:   98 / minute Pulse rhythm:   irregular Resp:     16 per minute BP sitting:   126 / 60  (right arm) Cuff size:   large  Vitals Entered By: [name] CMA (Aug 20, 2009 8:17 AM)  O2 Flow:  Room air CC: Rm 2- 6 Month Follow up disease management Is Patient Diabetic? Yes Comments no concerns   Primary Care Provider:  DThomos Lemons DO  CC:  Rm 2- 6 Month Follow up disease management.  History of Present Illness: 77 y/o white male with hx of htn, DM II (diet controlled) and P Abif for followo up  had a really bad cold 3 weeks ago still have mild cough no chest pain or SOB  he denies palpitations or dizziness  Preventive Screening-Counseling & Management  Alcohol-Tobacco     Smoking Status: quit  EKG  Procedure date:  08/20/2009  Findings:       Atrial flutter vs A fib with a ventricular response rate of:  87 bpm variable AV block,  no specific ST and T wave abnormality  Allergies (verified): No Known Drug Allergies  Past History:  Past Medical History: Congestive heart failure Coronary artery disease Diabetes mellitus, type II Chronic renal insufficiency GERD  Gout  Hypertension Osteoarthritis History of A-Fib  - s/p cardioversion 2007  Past Surgical History: S/P Mitral valve repair- 04/2005 S/P Cardioversion-11/02/2005  Coronary artery bypass graft-05/18/2005     Family History: Mother deceased at age 75, secondary to motor vehicle accident.  Father deceased at age 40 with a cerebral hemorrhage.  Patient has 2 brothers and 2 sisters.  There is diabetes in his siblings.     Social History: Married but separated from wife Alcohol use-yes Former Smoker quit 40 yrs ago (40 pack yr history)  Works part time at [employer] near [employer]  Physical Exam  General:  alert and overweight-appearing.   Head:  normocephalic and atraumatic.   right scalp 1cm actinic keratosis Mouth:  pharynx pink and moist.   Neck:  supple and no masses.   Lungs:  normal respiratory effort, normal breath sounds, no crackles, and no wheezes.   Heart:  normal rate, no gallop, and irregular rhythm.   Abdomen:  soft, non-tender, and normal bowel sounds.   Pulses:  dorsalis pedis and posterior tibial pulses are full and equal bilaterally Extremities:  trace left pedal edema and trace right pedal edema.   Neurologic:  cranial nerves II-XII intact and gait normal.   Psych:  normally interactive, good eye contact, not anxious appearing, and not depressed appearing.    Diabetes Management Exam:    Foot Exam (with socks and/or shoes not present):       Inspection:          Left foot: normal          Right foot: normal   Impression & Recommendations:  Problem # 1:  FIBRILLATION, ATRIAL (ICD-427.31) 77 y/o with hx of A Fib s/p cardioversion 2007 back in AFib.  [place]  score of 4.   Start coumadin.  arrange follow  up with cardiology.  stop aspirin.  rate controlled on coreg  The following medications were removed from the medication list:    Aspirin Ec 81 Mg Tbec (Aspirin) .Marland Kitchen... Take 1 tablet by mouth once a day His updated medication list for this problem includes:    Amlodipine Besylate 10 Mg Tabs (Amlodipine besylate) .Marland Kitchen... 1 tablet by mouth every morning    Coreg 6.25 Mg Tabs (Carvedilol) ..... One by mouth bid    Coumadin 4 Mg Tabs (Warfarin sodium) ..... One by mouth qd  Orders: T-Basic Metabolic Panel [phone]) T-TSH [id number]) T-T4, Free [id number]) Cardiology Referral (Cardiology) Prescription Created Electronically ([id number])Future Orders: T-Protime, Auto [id number]) ... 08/22/2009  Problem # 2:  HYPERTENSION (ICD-401.9) stable.    Maintain current medication regimen.  His updated medication list for  this problem includes:    Amlodipine Besylate 10 Mg Tabs (Amlodipine besylate) .Marland Kitchen... 1 tablet by mouth every morning    Coreg 6.25 Mg Tabs (Carvedilol) ..... One by mouth bid    Lisinopril-hydrochlorothiazide 20-25 Mg Tabs (Lisinopril-hydrochlorothiazide) ..... One by mouth once daily  BP today: 126/60 Prior BP: 120/80 (02/28/2009)  Labs Reviewed: K+: 5.0 (02/28/2009) Creat: : 1.47 (02/28/2009)   Chol: 118 (08/20/2008)   HDL: 34.20 (08/20/2008)   LDL: 54 (08/20/2008)   TG: 147.0 (08/20/2008)  Problem # 3:  DIABETES MELLITUS, TYPE II (ICD-250.00)  wt stable.   he does not monitor blood sugar at home.  monitor A1c.  he understands importance of diet and exercise.  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 Take 1 tablet by mouth once a day His updated medication list for this problem includes:    Lisinopril-hydrochlorothiazide 20-25 Mg Tabs (Lisinopril-hydrochlorothiazide) ..... One by mouth once daily  Orders: T- Hemoglobin A1C ([id number]) Prescription Created Electronically [id number])  Labs Reviewed: Creat: 1.47 (02/28/2009)     Last Eye Exam: normal (12/31/2008) Reviewed HgBA1c results: 6.4 (02/28/2009)  6.4 (08/20/2008)  Complete Medication List: 1)  Amlodipine Besylate 10 Mg Tabs (Amlodipine besylate) .Marland Kitchen.. 1 tablet by mouth every morning 2)  Coreg 6.25 Mg Tabs (Carvedilol) .... One by mouth bid 3)  Folic Acid 1 Mg Tabs (Folic acid) .... Take 1 tablet by mouth once a day 4)  Lisinopril-hydrochlorothiazide 20-25 Mg Tabs (Lisinopril-hydrochlorothiazide) .... One by mouth once daily 5)  Lovastatin 40 Mg Tabs (Lovastatin) .... Take 2  tab by mouth at bedtime 6)  Colchicine 0.6 Mg Tabs (Colchicine) .... One by mouth once daily prn 7)  Zostavax 36644 Unt/0.47ml Solr (Zoster vaccine live) .... Administer vaccine x 1 8)  Coumadin 4 Mg Tabs (Warfarin  sodium) .... One by mouth qd  Other Orders: EKG w/ Interpretation ([id number])  Patient Instructions: 1)  Please schedule a follow-up appointment in 1 month. Prescriptions: COUMADIN 4 MG TABS (WARFARIN SODIUM) one by mouth qd  #30 x 0   Entered and Authorized by:   D. Thomos Lemons DO   Signed by:   D. Thomos Lemons DO on 08/20/2009   Method used:   Electronically to        [facility]* (retail)       [address]       [place], [place]  [address]       Ph: [phone]       Fax: [phone]   RxID:  [id number] ZOSTAVAX 43329 UNT/0.65ML SOLR (ZOSTER VACCINE LIVE) administer vaccine x 1  #1 x 0   Entered and Authorized by:   D. Thomos Lemons DO   Signed by:   D. Thomos Lemons DO on 08/20/2009   Method used:   Print then Give to Patient   RxID:   [id number]   Current Allergies (reviewed today): No known allergies

## 2010-05-20 NOTE — Medication Information (Signed)
Summary: rov/ewj  Anticoagulant Therapy  Managed by: Cloyde Reams, RN, BSN Referring MD: Eden Emms PCP: Dondra Spry DO Supervising MD: Gala Romney MD, Reuel Boom Indication 1: Atrial Fibrillation Lab Used: [facility] [place] Site: [address] INR POC 2.4 INR RANGE 2.5-3.0  Dietary changes: no    Health status changes: no    Bleeding/hemorrhagic complications: no    Recent/future hospitalizations: no    Any changes in medication regimen? no    Recent/future dental: no  Any missed doses?: no       Is patient compliant with meds? yes       Allergies: No Known Drug Allergies  Anticoagulation Management History:      The patient is taking warfarin and comes in today for a routine follow up visit.  Positive risk factors for bleeding include an age of 74 years or older and presence of serious comorbidities.  The bleeding index is 'intermediate risk'.  Positive CHADS2 values include History of CHF, History of HTN, Age > 37 years old, and History of Diabetes.  His last INR was 2.30.  Anticoagulation responsible provider: Bensimhon MD, Reuel Boom.  INR POC: 2.4.  Cuvette Lot#: [id number].  Exp: 02/2011.    Anticoagulation Management Assessment/Plan:      The patient's current anticoagulation dose is Coumadin 4 mg tabs: Take as directed by Coumadin Clinic (up to 1 1/2 tablets daily).  The target INR is 2.5-3.0.  The next INR is due 02/06/2010.  Anticoagulation instructions were given to patient.  Results were reviewed/authorized by Cloyde Reams, RN, BSN.  He was notified by Cloyde Reams RN.         Prior Anticoagulation Instructions: INR 3.1  Continue on same dosage 1 tablet daily except 1.5 tablets on Thursdays.  Recheck in 4 weeks.    Current Anticoagulation Instructions: INR 2.4  Continue on same dosage 1 tablet daily except 1.5 tablets on Thursdays.  Recheck in 4 weeks.

## 2010-05-20 NOTE — Letter (Signed)
Summary: [facility]: Immunoassay Fecal Occult Blood (iFOB) Order Psychologist, counselling at [facility]  [address]   [place], [place] [address]   Phone: [phone]  Fax: [phone]      [facility]: Immunoassay Fecal Occult Blood (iFOB) Order Form   Aug 20, 2009 MRN: [id number]   DIN Javier [name] 22-Oct-1929   Physician Name:Dr Thomos Lemons  Diagnosis Code: V70.0      [name] CMA

## 2010-05-20 NOTE — Assessment & Plan Note (Signed)
Summary: per check out/sf      Allergies Added: NKDA  Primary Provider:  Dondra Spry DO  CC:  check up.  History of Present Illness: Javier [name] is seen today for F/U of afib, MV dx and CAD   He has a history of mitral valve repair with 2V  bypass in October, 2006.  This included LIMA to LAD and SVG to D2   He has had PAF.  He has hypertension, type 2 diabetes  He had been on pacerone in 2007 and had [facility] x1.  He was found to be back in afib by Dr Artist Pais on 5/3.  He was started on coumadin and referred for eval.  He had successful [facility] on 6/21.  His INR is Rx today at 2.6.  ECG confirms NSR.  He has less SOB, and no SSCP.  His primary is Dr Artist Pais.  He continue to have moderate alcholol intake  I thanked him for bringing peaches to the office today.  Current Problems (verified): 1)  Snoring  (ICD-786.09) 2)  Coumadin Therapy  (ICD-V58.61) 3)  Mitral Valve Replacement, Hx of  (ICD-V15.1) 4)  Coronary Artery Disease  (ICD-414.00) 5)  Congestive Heart Failure  (ICD-428.0) 6)  Fibrillation, Atrial  (ICD-427.31) 7)  Hypertension  (ICD-401.9) 8)  Hyperlipidemia  (ICD-272.2) 9)  Leg Cramps  (ICD-729.82) 10)  Bronchitis  (ICD-490) 11)  Shingles  (ICD-053.9) 12)  Renal Insufficiency, Chronic  (ICD-585.9) 13)  Osteoarthritis  (ICD-715.90) 14)  Gout  (ICD-274.9) 15)  Gerd  (ICD-530.81) 16)  Diabetes Mellitus, Type II  (ICD-250.00)  Current Medications (verified): 1)  Amlodipine Besylate 10 Mg Tabs (Amlodipine Besylate) .Marland Kitchen.. 1 Tablet By Mouth Every Morning 2)  Coreg 6.25 Mg  Tabs (Carvedilol) .... One By Mouth Bid 3)  Folic Acid 1 Mg Tabs (Folic Acid) .... Take 1 Tablet By Mouth Once A Day 4)  Lisinopril-Hydrochlorothiazide 20-25 Mg  Tabs (Lisinopril-Hydrochlorothiazide) .... One By Mouth Once Daily 5)  Lovastatin 40 Mg Tabs (Lovastatin) .... Take 2  Tab By Mouth At Bedtime 6)  Colchicine 0.6 Mg Tabs (Colchicine) .... One By Mouth Once Daily Prn 7)  Zostavax [address] Unt/0.75ml Solr (Zoster Vaccine  Live) .... Administer Vaccine X 1 8)  Coumadin 4 Mg Tabs (Warfarin Sodium) .... Take As Directed By Coumadin Clinic (Up To 1 1/2 Tablets Daily) 9)  Amiodarone Hcl 200 Mg Tabs (Amiodarone Hcl) .... Take One Tablet By Mouth Twice A Day  Allergies (verified): No Known Drug Allergies  Past History:  Past Medical History: Last updated: October 13, 2009 Current Problems:  CORONARY ARTERY DISEASE (ICD-414.00) CONGESTIVE HEART FAILURE (ICD-428.0) FIBRILLATION, ATRIAL (ICD-427.31)  HYPERTENSION (ICD-401.9) HYPERLIPIDEMIA (ICD-272.2) LEG CRAMPS (ICD-729.82) BRONCHITIS (ICD-490) SHINGLES (ICD-053.9) RENAL INSUFFICIENCY, CHRONIC (ICD-585.9) OSTEOARTHRITIS (ICD-715.90) GOUT (ICD-274.9) GERD (ICD-530.81) DIABETES MELLITUS, TYPE II (ICD-250.00)  Past Surgical History: Last updated: 10-13-2009 S/P Mitral valve repair- 04/2005 S/P Cardioversion-11/02/2005  Coronary artery bypass graft-05/18/2005       Family History: Last updated: 10-13-09 Mother deceased at age 66, secondary to motor vehicle accident.  Father deceased at age 23 with a cerebral hemorrhage.  Patient has 2 brothers and 2 sisters.  There is diabetes in his siblings.      Social History: Last updated: 10/13/09 Married but separated from wife Alcohol use-yes Former Smoker quit 40 yrs ago (40 pack yr history)   Works part time at [employer] near [employer]  Review of Systems       Denies fever, malais, weight loss, blurry vision, decreased  visual acuity, cough, sputum, SOB, hemoptysis, pleuritic pain, palpitaitons, heartburn, abdominal pain, melena, lower extremity edema, claudication, or rash.   Vital Signs:  Patient profile:   77 year old male Height:      67 inches Weight:      224 pounds BMI:     35.21 Pulse rate:   66 / minute Resp:     16 per minute BP sitting:   138 / 79  (left arm)  Vitals Entered By: Kem Parkinson (November 21, 2009 8:50 AM)  Physical Exam  General:  Affect  appropriate Healthy:  appears stated age HEENT: normal Neck supple with no adenopathy JVP normal no bruits no thyromegaly Lungs clear with no wheezing and good diaphragmatic motion Heart:  S1/S2 no murmur,rub, gallop or click PMI normal Abdomen: benighn, BS positve, no tenderness, no AAA no bruit.  No HSM or HJR Distal pulses intact with no bruits No edema Neuro non-focal Skin warm and dry    Impression & Recommendations:  Problem # 1:  COUMADIN THERAPY (ICD-V58.61) Has been Rx.  DCC on 6/11.  Continue for at least 6 months  Problem # 2:  MITRAL VALVE REPLACEMENT, HX OF (ICD-V15.1) No murmur F/U echo in a year  Problem # 3:  CORONARY ARTERY DISEASE (ICD-414.00) Stable with no angina His updated medication list for this problem includes:    Amlodipine Besylate 10 Mg Tabs (Amlodipine besylate) .Marland Kitchen... 1 tablet by mouth every morning    Coreg 6.25 Mg Tabs (Carvedilol) ..... One by mouth bid    Lisinopril-hydrochlorothiazide 20-25 Mg Tabs (Lisinopril-hydrochlorothiazide) ..... One by mouth once daily    Coumadin 4 Mg Tabs (Warfarin sodium) .Marland Kitchen... Take as directed by coumadin clinic (up to 1 1/2 tablets daily)  Problem # 4:  FIBRILLATION, ATRIAL (ICD-427.31) Maint NSR.  Consider stopping amiodarone next visit.  If not will need labs and DLCO His updated medication list for this problem includes:    Coreg 6.25 Mg Tabs (Carvedilol) ..... One by mouth bid    Coumadin 4 Mg Tabs (Warfarin sodium) .Marland Kitchen... Take as directed by coumadin clinic (up to 1 1/2 tablets daily)    Amiodarone Hcl 200 Mg Tabs (Amiodarone hcl) .Marland Kitchen... Take one tablet by mouth twice a day

## 2010-05-20 NOTE — Progress Notes (Signed)
Summary: sleep results  Phone Note Outgoing Call   Summary of Call: call pt - sleep study shows severe OSA.  I rec referral to sleep specialist Initial call taken by: D. Thomos Lemons DO,  January 03, 2010 5:56 PM  Follow-up for Phone Call        Pt's wife notified and is aware of appt date/time with Dr. Vassie Loll. She states that pt. is not likely to keep appt and she doesn't think he will go through with treatment. Nicki Guadalajara Fergerson CMA Duncan Dull)  January 06, 2010 12:59 PM   New Problems: SLEEP APNEA, OBSTRUCTIVE (ICD-327.23)   New Problems: SLEEP APNEA, OBSTRUCTIVE (ICD-327.23)

## 2010-05-20 NOTE — Miscellaneous (Signed)
Summary:  cardioversion  Clinical Lists Changes  Observations: Added new observation of RESULTS MISC:     CONSULTATION      A 77 year old patient, previous mitral valve repair with coronary artery   bypass surgery.  The patient in atrial fibrillation, has been on   therapeutic Coumadin.  INR was in excess of 3.0 today.  Cardioversion   was done for symptomatic dyspnea and fatigue.  The patient was   anesthetized by Dr. Jairo Ben with 75 mg of propofol.  A single   200-joule biphasic shock was delivered.  He converted from atrial   fibrillation at a rate of 90 to normal sinus rhythm at a rate of 68.      IMPRESSION:  Successful cardioversion on therapeutic Coumadin.  No   immediate neurological sequela.               Noralyn Pick. Eden Emms, MD, [facility]            PCN/MEDQ  D:  10/03/2009  T:  10/04/2009  Job:  [id number]      Electronically Signed by Charlton Haws MD FACC on 10/07/2009 02:16:44 PM   (10/04/2009 11:40)      MISC. Report  Procedure date:  10/04/2009  Findings:          CONSULTATION      A 77 year old patient, previous mitral valve repair with coronary artery   bypass surgery.  The patient in atrial fibrillation, has been on   therapeutic Coumadin.  INR was in excess of 3.0 today.  Cardioversion   was done for symptomatic dyspnea and fatigue.  The patient was   anesthetized by Dr. Jairo Ben with 75 mg of propofol.  A single   200-joule biphasic shock was delivered.  He converted from atrial   fibrillation at a rate of 90 to normal sinus rhythm at a rate of 68.      IMPRESSION:  Successful cardioversion on therapeutic Coumadin.  No   immediate neurological sequela.               Noralyn Pick. Eden Emms, MD, [facility]            PCN/MEDQ  D:  10/03/2009  T:  10/04/2009  Job:  [id number]      Electronically Signed by Charlton Haws MD [facility] on 10/07/2009 02:16:44 PM

## 2010-05-20 NOTE — Progress Notes (Signed)
Summary: Amolodipine Refill  Phone Note Refill Request Message from:  Fax from Pharmacy on January 09, 2010 8:45 AM  Refills Requested: Medication #1:  AMLODIPINE BESYLATE 10 MG TABS 1 tablet by mouth every morning   Dosage confirmed as above?Dosage Confirmed   Brand Name Necessary? No   Supply Requested: 1 month   Last Refilled: 12/09/2009  Method Requested: Electronic Next Appointment Scheduled: 9-27 Dr Vassie Loll Initial call taken by: Roselle Locus,  January 09, 2010 8:46 AM    Prescriptions: AMLODIPINE BESYLATE 10 MG TABS (AMLODIPINE BESYLATE) 1 tablet by mouth every morning  #30 x 2   Entered by:   [name] CMA   Authorized by:   D. Thomos Lemons DO   Signed by:   [name] CMA on 01/09/2010   Method used:   Electronically to        [facility] (retail)       [address]       [place], [place]  [address]       Ph: [phone]       Fax: [phone]   RxID:   [id number]

## 2010-05-20 NOTE — Medication Information (Signed)
Summary: rov/sp  Anticoagulant Therapy  Managed by: Cloyde Reams, RN, BSN Referring MD: Eden Emms PCP: Dondra Spry DO Supervising MD: Johney Frame MD, [name] Indication 1: Atrial Fibrillation Lab Used: [facility] of [facility] [place] Site: [address] INR POC 2.6 INR RANGE 2.5-3.0  Dietary changes: no    Health status changes: no    Bleeding/hemorrhagic complications: no    Recent/future hospitalizations: yes       Details: S/P DCCV last week.    Any changes in medication regimen? no    Recent/future dental: no  Any missed doses?: no       Is patient compliant with meds? yes       Allergies: No Known Drug Allergies  Anticoagulation Management History:      The patient is taking warfarin and comes in today for a routine follow up visit.  Positive risk factors for bleeding include an age of 75 years or older and presence of serious comorbidities.  The bleeding index is 'intermediate risk'.  Positive CHADS2 values include History of CHF, History of HTN, Age > 75 years old, and History of Diabetes.  His last INR was 2.30.  Anticoagulation responsible provider: Zayveon Raschke MD, [name].  INR POC: 2.6.  Cuvette Lot#: [id number].  Exp: 12/2010.    Anticoagulation Management Assessment/Plan:      The patient's current anticoagulation dose is Coumadin 4 mg tabs: Take as directed by Coumadin Clinic (up to 1 1/2 tablets daily).  The target INR is 2.5-3.0.  The next INR is due 11/07/2009.  Anticoagulation instructions were given to patient.  Results were reviewed/authorized by Cloyde Reams, RN, BSN.  He was notified by Cloyde Reams RN.         Prior Anticoagulation Instructions: INR 3.5  Continue same dose of 1 tablet every day except 1 1/2 tablets on Sunday and Thursday.  Eat a serving of [employer] today.  Cardioversion on Thursday.   Current Anticoagulation Instructions: INR 2.6  Continue on same dosage 4mg  daily except 6mg  on Sundays and Thursdays.  Recheck in 4 weeks.

## 2010-05-20 NOTE — Medication Information (Signed)
Summary: rov/ewj  Anticoagulant Therapy  Managed by: Cloyde Reams, RN, BSN Referring MD: Eden Emms PCP: Dondra Spry DO Supervising MD: Gala Romney MD, Reuel Boom Indication 1: Atrial Fibrillation Lab Used: [facility] [place] Site: [address] INR POC 2.6 INR RANGE 2.5-3.0  Dietary changes: no    Health status changes: no    Bleeding/hemorrhagic complications: no    Recent/future hospitalizations: no    Any changes in medication regimen? no    Recent/future dental: no  Any missed doses?: no       Is patient compliant with meds? yes       Allergies: No Known Drug Allergies  Anticoagulation Management History:      The patient is taking warfarin and comes in today for a routine follow up visit.  Positive risk factors for bleeding include an age of 75 years or older and presence of serious comorbidities.  The bleeding index is 'intermediate risk'.  Positive CHADS2 values include History of CHF, History of HTN, Age > 75 years old, and History of Diabetes.  His last INR was 2.30.  Anticoagulation responsible Giovanni Bath: Bensimhon MD, Reuel Boom.  INR POC: 2.6.  Cuvette Lot#: [id number].  Exp: 02/2011.    Anticoagulation Management Assessment/Plan:      The patient's current anticoagulation dose is Coumadin 4 mg tabs: Take as directed by Coumadin Clinic (up to 1 1/2 tablets daily).  The target INR is 2.5-3.0.  The next INR is due 03/06/2010.  Anticoagulation instructions were given to patient.  Results were reviewed/authorized by Cloyde Reams, RN, BSN.  He was notified by Cloyde Reams RN.         Prior Anticoagulation Instructions: INR 2.4  Continue on same dosage 1 tablet daily except 1.5 tablets on Thursdays.  Recheck in 4 weeks.    Current Anticoagulation Instructions: INR 2.6  Continue on same dosahe 1 tablet daily except 1.5 tablets on Thursdays.  Recheck in 4 weeks.

## 2010-05-20 NOTE — Medication Information (Signed)
Summary: PFT Order  PFT Order   Imported By: Marylou Mccoy 01/29/2010 14:06:15  _____________________________________________________________________  External Attachment:    Type:   Image     Comment:   External Document

## 2010-05-20 NOTE — Miscellaneous (Signed)
Summary: Zostavax  Clinical Lists Changes  Observations: Added new observation of ZOSTAVAX: Zostavax (09/04/2009 10:06)      Immunization History:  Zostavax History:    Zostavax # 1:  zostavax (09/04/2009)

## 2010-05-20 NOTE — Assessment & Plan Note (Signed)
Summary: 3      Allergies Added: NKDA  Primary Provider:  Dondra Spry DO  CC:  pt didnot bring med list he states this list is correct.  History of Present Illness: Javier [name] is seen today at the request of Dr Artist Pais.  He is a new patient not being seen in cardiology since 2007  He has a history of mitral valve repair with 2V  bypass in October, 2006.  This included LIMA to LAD and SVG to D2   He has had PAF.  He has hypertension, type 2 diabetes  He had been on pacerone in 2007 and had [facility] x1.  He was found to be back in afib by Dr Artist Pais on 20-Sep-2022.  He was started on coumadin and referred for eval.  He has not had palpitaitons, SSCP, dyspnea, TIA or edema.  His rate control seems fine.  There has been no PND, orhtopnea.  I had a long discussion with [name] about rate control, anticoagulaiton, and attempt at [facility].  We will folloow his INR at the [facility] office for [place] prior to [facility].  I would attempt a second DCC on amiodarone if we are not succesful initially.  He has not had any SSCP and outside of afib his ECG is nomral so I don't think he needs an ETT. His echo today showed a good MV repair with trivial MR, mild to moderate biatrial enlargement and normal LV funciton.  His INR was Rx two weeks ago and will be checked today.  We will tentatively schedule his [facility] for 6/16  Current Problems (verified): 1)  Coumadin Therapy  (ICD-V58.61) 2)  Mitral Valve Replacement, Hx of  (ICD-V15.1) 3)  Coronary Artery Disease  (ICD-414.00) 4)  Congestive Heart Failure  (ICD-428.0) 5)  Fibrillation, Atrial  (ICD-427.31) 6)  Hypertension  (ICD-401.9) 7)  Hyperlipidemia  (ICD-272.2) 8)  Leg Cramps  (ICD-729.82) 9)  Bronchitis  (ICD-490) 10)  Shingles  (ICD-053.9) 11)  Renal Insufficiency, Chronic  (ICD-585.9) 12)  Osteoarthritis  (ICD-715.90) 13)  Gout  (ICD-274.9) 14)  Gerd  (ICD-530.81) 15)  Diabetes Mellitus, Type II  (ICD-250.00)  Current Medications (verified): 1)  Amlodipine Besylate 10 Mg Tabs  (Amlodipine Besylate) .Marland Kitchen.. 1 Tablet By Mouth Every Morning 2)  Coreg 6.25 Mg  Tabs (Carvedilol) .... One By Mouth Bid 3)  Folic Acid 1 Mg Tabs (Folic Acid) .... Take 1 Tablet By Mouth Once A Day 4)  Lisinopril-Hydrochlorothiazide 20-25 Mg  Tabs (Lisinopril-Hydrochlorothiazide) .... One By Mouth Once Daily 5)  Lovastatin 40 Mg Tabs (Lovastatin) .... Take 2  Tab By Mouth At Bedtime 6)  Colchicine 0.6 Mg Tabs (Colchicine) .... One By Mouth Once Daily Prn 7)  Zostavax [address] Unt/0.37ml Solr (Zoster Vaccine Live) .... Administer Vaccine X 1 8)  Coumadin 4 Mg Tabs (Warfarin Sodium) .... One By Mouth Qd  Allergies (verified): No Known Drug Allergies  Past History:  Past Medical History: Last updated: 08/26/2009 Current Problems:  CORONARY ARTERY DISEASE (ICD-414.00) CONGESTIVE HEART FAILURE (ICD-428.0) FIBRILLATION, ATRIAL (ICD-427.31) HYPERTENSION (ICD-401.9) HYPERLIPIDEMIA (ICD-272.2) LEG CRAMPS (ICD-729.82) BRONCHITIS (ICD-490) SHINGLES (ICD-053.9) RENAL INSUFFICIENCY, CHRONIC (ICD-585.9) OSTEOARTHRITIS (ICD-715.90) GOUT (ICD-274.9) GERD (ICD-530.81) DIABETES MELLITUS, TYPE II (ICD-250.00)  Past Surgical History: Last updated: 2009/09/19 S/P Mitral valve repair- 04/2005 S/P Cardioversion-11/02/2005  Coronary artery bypass graft-05/18/2005     Family History: Last updated: 09-19-09 Mother deceased at age 56, secondary to motor vehicle accident.  Father deceased at age 71 with a cerebral hemorrhage.  Patient has 2 brothers and  2 sisters.  There is diabetes in his siblings.     Social History: Last updated: 08/20/2009 Married but separated from wife Alcohol use-yes Former Smoker quit 40 yrs ago (40 pack yr history)  Works part time at [employer] near [employer]  Review of Systems       Denies fever, malais, weight loss, blurry vision, decreased visual acuity, cough, sputum, SOB, hemoptysis, pleuritic pain, palpitaitons, heartburn, abdominal pain, melena, lower  extremity edema, claudication, or rash.   Vital Signs:  Patient profile:   77 year old male Height:      67 inches Weight:      230 pounds BMI:     36.15 Pulse rate:   80 / minute Resp:     14 per minute BP sitting:   143 / 85  (right arm)  Vitals Entered By: Kem Parkinson (September 19, 2009 3:51 PM)  Physical Exam  General:  Affect appropriate Healthy:  appears stated age HEENT: normal Neck supple with no adenopathy JVP normal no bruits no thyromegaly Lungs clear with no wheezing and good diaphragmatic motion Heart:  S1/S2 no murmur,rub, gallop or click PMI normal Abdomen: benighn, BS positve, no tenderness, no AAA no bruit.  No HSM or HJR Distal pulses intact with no bruits No edema Neuro non-focal Skin warm and dry    Impression & Recommendations:  Problem # 1:  COUMADIN THERAPY (ICD-V58.61) F/U INR today no bleeding problems  Problem # 2:  MITRAL VALVE REPLACEMENT, HX OF (ICD-V15.1) S/O MV repair.  Trivial residual MR with no MS on echo reviewed today.  SBE prophylaxis is approptriate  Problem # 3:  CORONARY ARTERY DISEASE (ICD-414.00) Stabel post CABG no angina His updated medication list for this problem includes:    Amlodipine Besylate 10 Mg Tabs (Amlodipine besylate) .Marland Kitchen... 1 tablet by mouth every morning    Coreg 6.25 Mg Tabs (Carvedilol) ..... One by mouth bid    Lisinopril-hydrochlorothiazide 20-25 Mg Tabs (Lisinopril-hydrochlorothiazide) ..... One by mouth once daily    Coumadin 4 Mg Tabs (Warfarin sodium) ..... One by mouth qd  Problem # 4:  FIBRILLATION, ATRIAL (ICD-427.31) Good rate control  DCC on 6/16 so long as INR Rx His updated medication list for this problem includes:    Coreg 6.25 Mg Tabs (Carvedilol) ..... One by mouth bid    Coumadin 4 Mg Tabs (Warfarin sodium) ..... One by mouth qd

## 2010-05-20 NOTE — Assessment & Plan Note (Signed)
Summary: ec3/ afib. pt has m/m / gd      Allergies Added: NKDA  Primary Provider:  Dondra Spry DO  CC:  no compalints.  History of Present Illness: Javier [name] is seen today at the request of Dr Artist Pais.  He is a new patient not being seen in cardiology since 2007  He has a history of mitral valve repair with 2V  bypass in October, 2006.  This included LIMA to LAD and SVG to D2   He has had PAF.  He has hypertension, type 2 diabetes  He had been on pacerone in 2007 and had [facility] x1.  He was found to be back in afib by Dr Artist Pais on 5/3.  He was started on coumadin and referred for eval.  He has not had palpitaitons, SSCP, dyspnea, TIA or edema.  His rate control seems fine.  There has been no PND, orhtopnea.  I had a long discussion with Javier [name] about rate control, anticoagulaiton, and attempt at [facility].  We will folloow his INR at the [facility] office for [name] prior to [facility].  I would attempt a second DCC on amiodarone if we are not succesful initially.  He has not had any SSCP and outside of afib his ECG is nomral so I don't think he needs an ETT.  We will check an echo to assess his MVR, LA size and make sure EF is still normal.  He and his wife understand the plan and are willing to proceed.    Current Problems (verified): 1)  Mitral Valve Replacement, Hx of  (ICD-V15.1) 2)  Coronary Artery Disease  (ICD-414.00) 3)  Congestive Heart Failure  (ICD-428.0) 4)  Fibrillation, Atrial  (ICD-427.31) 5)  Hypertension  (ICD-401.9) 6)  Hyperlipidemia  (ICD-272.2) 7)  Leg Cramps  (ICD-729.82) 8)  Bronchitis  (ICD-490) 9)  Shingles  (ICD-053.9) 10)  Renal Insufficiency, Chronic  (ICD-585.9) 11)  Osteoarthritis  (ICD-715.90) 12)  Gout  (ICD-274.9) 13)  Gerd  (ICD-530.81) 14)  Diabetes Mellitus, Type II  (ICD-250.00)  Current Medications (verified): 1)  Amlodipine Besylate 10 Mg Tabs (Amlodipine Besylate) .Marland Kitchen.. 1 Tablet By Mouth Every Morning 2)  Coreg 6.25 Mg  Tabs (Carvedilol) .... One By Mouth Bid 3)   Folic Acid 1 Mg Tabs (Folic Acid) .... Take 1 Tablet By Mouth Once A Day 4)  Lisinopril-Hydrochlorothiazide 20-25 Mg  Tabs (Lisinopril-Hydrochlorothiazide) .... One By Mouth Once Daily 5)  Lovastatin 40 Mg Tabs (Lovastatin) .... Take 2  Tab By Mouth At Bedtime 6)  Colchicine 0.6 Mg Tabs (Colchicine) .... One By Mouth Once Daily Prn 7)  Zostavax 11914 Unt/0.34ml Solr (Zoster Vaccine Live) .... Administer Vaccine X 1 8)  Coumadin 4 Mg Tabs (Warfarin Sodium) .... One By Mouth Qd  Allergies (verified): No Known Drug Allergies  Past History:  Past Medical History: Last updated: 08/26/2009 Current Problems:  CORONARY ARTERY DISEASE (ICD-414.00) CONGESTIVE HEART FAILURE (ICD-428.0) FIBRILLATION, ATRIAL (ICD-427.31) HYPERTENSION (ICD-401.9) HYPERLIPIDEMIA (ICD-272.2) LEG CRAMPS (ICD-729.82) BRONCHITIS (ICD-490) SHINGLES (ICD-053.9) RENAL INSUFFICIENCY, CHRONIC (ICD-585.9) OSTEOARTHRITIS (ICD-715.90) GOUT (ICD-274.9) GERD (ICD-530.81) DIABETES MELLITUS, TYPE II (ICD-250.00)  Past Surgical History: Last updated: 02/28/2009 S/P Mitral valve repair- 04/2005 S/P Cardioversion-11/02/2005 Coronary artery bypass graft-05/18/2005     Family History: Last updated: 02/13/2008 Mother deceased at age 47, secondary to motor vehicle accident.  Father deceased at age 27 with a cerebral hemorrhage.  Patient has 2 brothers and 2 sisters.  There is diabetes in his siblings.    Social History: Last updated: 05/31/2008 Married  but separated from wife Alcohol use-yes Former Smoker quit 40 yrs ago (40 pack yr history) Works part time at [employer] near [employer]  Review of Systems       Denies fever, malais, weight loss, blurry vision, decreased visual acuity, cough, sputum, SOB, hemoptysis, pleuritic pain, palpitaitons, heartburn, abdominal pain, melena, lower extremity edema, claudication, or rash.   Vital Signs:  Patient profile:   77 year old male Height:      67  inches Weight:      223 pounds BMI:     35.05 Pulse rate:   77 / minute Pulse rhythm:   irregularly irregular Resp:     14 per minute BP sitting:   124 / 84  (left arm)  Vitals Entered By: Kem Parkinson (Aug 29, 2009 10:02 AM)  Physical Exam  General:  Affect appropriate Healthy:  appears stated age HEENT: normal Neck supple with no adenopathy JVP normal no bruits no thyromegaly Lungs clear with no wheezing and good diaphragmatic motion Heart:  S1/S2 systolic murmur,rub, gallop or click PMI normal Abdomen: benighn, BS positve, no tenderness, no AAA no bruit.  No HSM or HJR Distal pulses intact with no bruits No edema Neuro non-focal Skin warm and dry    Impression & Recommendations:  Problem # 1:  FIBRILLATION, ATRIAL (ICD-427.31) Continue BB for rate control  DCC in 4-6 weeks \\after  Rx anticoagulaiton His updated medication list for this problem includes:    Coreg 6.25 Mg Tabs (Carvedilol) ..... One by mouth bid    Coumadin 4 Mg Tabs (Warfarin sodium) ..... One by mouth qd  Orders: Echocardiogram (Echo)  Problem # 2:  MITRAL VALVE REPLACEMENT, HX OF (ICD-V15.1) Sounds ok on exam.  F.U echo Orders: Echocardiogram (Echo)  Problem # 3:  CORONARY ARTERY DISEASE (ICD-414.00) Stable no angina continue 81mg  ASA and BB His updated medication list for this problem includes:    Amlodipine Besylate 10 Mg Tabs (Amlodipine besylate) .Marland Kitchen... 1 tablet by mouth every morning    Coreg 6.25 Mg Tabs (Carvedilol) ..... One by mouth bid    Lisinopril-hydrochlorothiazide 20-25 Mg Tabs (Lisinopril-hydrochlorothiazide) ..... One by mouth once daily    Coumadin 4 Mg Tabs (Warfarin sodium) ..... One by mouth qd  Problem # 4:  HYPERTENSION (ICD-401.9) Well controlled His updated medication list for this problem includes:    Amlodipine Besylate 10 Mg Tabs (Amlodipine besylate) .Marland Kitchen... 1 tablet by mouth every morning    Coreg 6.25 Mg Tabs (Carvedilol) ..... One by mouth bid     Lisinopril-hydrochlorothiazide 20-25 Mg Tabs (Lisinopril-hydrochlorothiazide) ..... One by mouth once daily  Problem # 5:  COUMADIN THERAPY (ICD-V58.61) F/U INR in our office 2 weeks.  Discussed dietary restrictions.  Goal is 2.5-3.0 for 4 weeks before [facility]  Patient Instructions: 1)  Your physician recommends that you schedule a follow-up appointment in: 3 WEEKS WITH DR. Eden Emms 2)  Your physician has requested that you have an echocardiogram.  Echocardiography is a painless test that uses sound waves to create images of your heart. It provides your doctor with information about the size and shape of your heart and how well your heart's chambers and valves are working.  This procedure takes approximately one hour. There are no restrictions for this procedure. SAME DAY AS APPT WITH DR. Eden Emms 3)  Your physician recommends that you continue on your current medications as directed. Please refer to the Current Medication list given to you today. 4)  You have been referred to  THE COUMADIN CLINIC HERE IN 2 WEEKS   Echocardiogram Report  Procedure date:  08/29/2009  Findings:      Afib Flutter 77 better rate control than ECG 5/3 No other abnormal chages

## 2010-05-20 NOTE — Progress Notes (Signed)
Summary: Lovastatin Refill  Phone Note Refill Request Message from:  Fax from Pharmacy on October 14, 2009 9:00 AM  Refills Requested: Medication #1:  LOVASTATIN 40 MG TABS Take 2  tab by mouth at bedtime   Dosage confirmed as above?Dosage Confirmed   Brand Name Necessary? No   Supply Requested: 1 month   Last Refilled: 09/10/2009  Method Requested: Electronic Next Appointment Scheduled: No appointment schedule with Dr Artist Pais Initial call taken by: [name] CMA,  October 14, 2009 9:01 AM    Prescriptions: LOVASTATIN 40 MG TABS (LOVASTATIN) Take 2  tab by mouth at bedtime  #60 x 5   Entered by:   [name] CMA   Authorized by:   D. Thomos Lemons DO   Signed by:   [name] CMA on 10/14/2009   Method used:   Electronically to        [facility] (retail)       [address]       [place], [place]  [address]       Ph: [phone]       Fax: [phone]   RxID:   [id number]

## 2010-05-20 NOTE — Medication Information (Signed)
Summary: rov/ewj  Anticoagulant Therapy  Managed by: Cloyde Reams, RN, BSN Referring MD: Eden Emms PCP: Dondra Spry DO Supervising MD: Graciela Husbands MD, Viviann Spare Indication 1: Atrial Fibrillation Lab Used: [facility] [place] Site: [address] INR POC 3.1 INR RANGE 2.5-3.0  Dietary changes: no    Health status changes: no    Bleeding/hemorrhagic complications: no    Recent/future hospitalizations: no    Any changes in medication regimen? no    Recent/future dental: no  Any missed doses?: no       Is patient compliant with meds? yes       Allergies: No Known Drug Allergies  Anticoagulation Management History:      The patient is taking warfarin and comes in today for a routine follow up visit.  Positive risk factors for bleeding include an age of 75 years or older and presence of serious comorbidities.  The bleeding index is 'intermediate risk'.  Positive CHADS2 values include History of CHF, History of HTN, Age > 63 years old, and History of Diabetes.  His last INR was 2.30.  Anticoagulation responsible provider: Graciela Husbands MD, Viviann Spare.  INR POC: 3.1.  Cuvette Lot#: [id number].  Exp: 01/2011.    Anticoagulation Management Assessment/Plan:      The patient's current anticoagulation dose is Coumadin 4 mg tabs: Take as directed by Coumadin Clinic (up to 1 1/2 tablets daily).  The target INR is 2.5-3.0.  The next INR is due 01/09/2010.  Anticoagulation instructions were given to patient.  Results were reviewed/authorized by Cloyde Reams, RN, BSN.  He was notified by Cloyde Reams RN.         Prior Anticoagulation Instructions: INR 3.5  Take 1/2 tablet tomorrow, then start taking 1 tablet daily except 1.5 tablets on Thursdays.  Recheck in 3 weeks.   Current Anticoagulation Instructions: INR 3.1  Continue on same dosage 1 tablet daily except 1.5 tablets on Thursdays.  Recheck in 4 weeks.

## 2010-05-20 NOTE — Letter (Signed)
Summary: Patient Did Not Return Calls/[facility]   Patient Did Not Return Calls/[facility]   Imported By: Lanelle Bal 11/05/2009 09:38:36  _____________________________________________________________________  External Attachment:    Type:   Image     Comment:   External Document

## 2010-05-20 NOTE — Letter (Signed)
    at [facility] [address] [place], [place]  [address]  [place] Phone: [phone]      Aug 21, 2009   BARTHOLOMEW RAMESH [address] [place], [place] [address]  RE:  LAB RESULTS  Dear  Mr. Depaola,  The following is an interpretation of your most recent lab tests.  Please take note of any instructions provided or changes to medications that have resulted from your lab work.  ELECTROLYTES:  Good - no changes needed  KIDNEY FUNCTION TESTS:  Good - no changes needed   THYROID STUDIES:  Thyroid studies normal TSH: 1.574     DIABETIC STUDIES:  Fair - schedule a follow-up appointment Blood Glucose: 145   HgbA1C: 7.2   Microalbumin/Creatinine Ratio: 22.4          Sincerely Yours,    Dr. Thomos Lemons

## 2010-05-20 NOTE — Medication Information (Signed)
Summary: coumadin 4mg   Anticoagulant Therapy  Managed by: Weston Brass, PharmD Referring MD: Eden Emms PCP: Dondra Spry DO Supervising MD: Daleen Squibb MD, Maisie Fus Indication 1: Atrial Fibrillation Lab Used: [facility] [place] Site: [address] INR POC 2.3 INR RANGE 2.5-3.0  Dietary changes: no    Health status changes: yes       Details: hx of afib.  Was on Coumadin about 4 years ago but recently went back into Afib.  Started Coumadin first part of May.  Plan to DCCV once therapeutic for 4 weeks.   Bleeding/hemorrhagic complications: no    Recent/future hospitalizations: no    Any changes in medication regimen? no    Recent/future dental: no  Any missed doses?: no       Is patient compliant with meds? yes      Comments: Pt educated on bleeding risks, dietary concerns, and medication interactions   Allergies: No Known Drug Allergies  Anticoagulation Management History:      The patient comes in today for his initial visit for anticoagulation therapy.  Positive risk factors for bleeding include an age of 75 years or older and presence of serious comorbidities.  The bleeding index is 'intermediate risk'.  Positive CHADS2 values include History of CHF, History of HTN, Age > 75 years old, and History of Diabetes.  His last INR was 2.30.  Anticoagulation responsible provider: Daleen Squibb MD, Maisie Fus.  INR POC: 2.3.  Cuvette Lot#: [id number].  Exp: 11/2010.    Anticoagulation Management Assessment/Plan:      The patient's current anticoagulation dose is Coumadin 4 mg tabs: one by mouth qd.  The target INR is 2.5-3.0.  The next INR is due 09/19/2009.  Anticoagulation instructions were given to patient.  Results were reviewed/authorized by Weston Brass, PharmD.  He was notified by Weston Brass PharmD.         Current Anticoagulation Instructions: INR 2.3  Increase dose to 1 tablet every day except 1 1/2 tablets on Thursday.

## 2010-05-20 NOTE — Progress Notes (Signed)
Summary: Folic Acid Refill  Phone Note Refill Request Message from:  Fax from Pharmacy on October 10, 2009 2:45 PM  Refills Requested: Medication #1:  FOLIC ACID 1 MG TABS Take 1 tablet by mouth once a day   Dosage confirmed as above?Dosage Confirmed   Brand Name Necessary? No   Supply Requested: 1 month   Last Refilled: 09/10/2009  Method Requested: Electronic Next Appointment Scheduled: No follow up with Dr Artist Pais Initial call taken by: [name] CMA,  October 10, 2009 2:45 PM    Prescriptions: FOLIC ACID 1 MG TABS (FOLIC ACID) Take 1 tablet by mouth once a day  #32 x 5   Entered by:   [name] CMA   Authorized by:   D. Thomos Lemons DO   Signed by:   [name] CMA on 10/10/2009   Method used:   Electronically to        [facility] (retail)       [address]       [place], [place]  [address]       Ph: [phone]       Fax: [phone]   RxID:   [id number]

## 2010-05-20 NOTE — Letter (Signed)
   [place] at [facility] [address] [place], [place]  [address]  [place] Phone: [phone]      February 14, 2010   WAQAS BRUHL [address] [place], [place] [address]  RE:  LAB RESULTS  Dear  Mr. Zaldivar,  The following is an interpretation of your most recent lab tests.  Please take note of any instructions provided or changes to medications that have resulted from your lab work.  ELECTROLYTES:  Good - no changes needed  KIDNEY FUNCTION TESTS:  Good - no changes needed    DIABETIC STUDIES:  Fair - schedule a follow-up appointment Blood Glucose: 128   HgbA1C: 6.6   Microalbumin/Creatinine Ratio: 22.4          Sincerely Yours,    Dr. Thomos Lemons  Appended Document:  Mailed.

## 2010-05-20 NOTE — Progress Notes (Signed)
Summary: Lisinopril & Carvedilol refill  Phone Note Refill Request Message from:  Fax from Pharmacy on February 06, 2010 1:16 PM  Refills Requested: Medication #1:  COREG 6.25 MG  TABS one by mouth bid   Dosage confirmed as above?Dosage Confirmed   Brand Name Necessary? No   Supply Requested: 1 month   Last Refilled: 12/09/2009  Medication #2:  LISINOPRIL-HYDROCHLOROTHIAZIDE 20-25 MG  TABS one by mouth once daily   Dosage confirmed as above?Dosage Confirmed   Brand Name Necessary? No   Supply Requested: 1 month   Last Refilled: 12/09/2009 [facility], [address]   Method Requested: Electronic Next Appointment Scheduled: 03/06/10 Initial call taken by: Lannette Donath,  February 06, 2010 1:19 PM  Follow-up for Phone Call        call placed to patient at  [phone], his wife states patient was not avaliable. Message left with patients wife to inform patient he is fue for a follow up visit. Office number provided for patient to call back to schedule Follow-up by: [name] CMA,  February 06, 2010 2:01 PM    Prescriptions: COREG 6.25 MG  TABS (CARVEDILOL) one by mouth bid  #60 x 0   Entered by:   [name] CMA   Authorized by:   D. Thomos Lemons DO   Signed by:   [name] CMA on 02/06/2010   Method used:   Electronically to        [facility] (retail)       [address]       [place], [place]  [address]       Ph: [phone]       Fax: [phone]   RxID:   [id number] LISINOPRIL-HYDROCHLOROTHIAZIDE 20-25 MG  TABS (LISINOPRIL-HYDROCHLOROTHIAZIDE) one by mouth once daily  #30 x 0   Entered by:   [name] CMA   Authorized by:   D. Thomos Lemons DO   Signed by:   [name] CMA on 02/06/2010   Method used:   Electronically to        [facility] (retail)       [address]       [place], [place]  [address]       Ph: [phone]       Fax: [phone]   RxID:   [id number]

## 2010-05-20 NOTE — Medication Information (Signed)
Summary: rov/sp  Anticoagulant Therapy  Managed by: Weston Brass, PharmD Referring MD: Eden Emms PCP: Dondra Spry DO Supervising MD: Excell Seltzer MD, Casimiro Needle Indication 1: Atrial Fibrillation Lab Used: [facility] [place] Site: [address] INR POC 3.5 INR RANGE 2.5-3.0  Dietary changes: no    Health status changes: no    Bleeding/hemorrhagic complications: no    Recent/future hospitalizations: no    Any changes in medication regimen? no    Recent/future dental: no  Any missed doses?: no       Is patient compliant with meds? yes       Allergies: No Known Drug Allergies  Anticoagulation Management History:      The patient is taking warfarin and comes in today for a routine follow up visit.  Positive risk factors for bleeding include an age of 75 years or older and presence of serious comorbidities.  The bleeding index is 'intermediate risk'.  Positive CHADS2 values include History of CHF, History of HTN, Age > 58 years old, and History of Diabetes.  His last INR was 2.30.  Anticoagulation responsible provider: Excell Seltzer MD, Casimiro Needle.  INR POC: 3.5.  Cuvette Lot#: [id number].  Exp: 11/2010.    Anticoagulation Management Assessment/Plan:      The patient's current anticoagulation dose is Coumadin 4 mg tabs: Take as directed by Coumadin Clinic (up to 1 1/2 tablets daily).  The target INR is 2.5-3.0.  The next INR is due 10/10/2009.  Anticoagulation instructions were given to patient.  Results were reviewed/authorized by Weston Brass, PharmD.  He was notified by Weston Brass PharmD.         Prior Anticoagulation Instructions: INR 2.6  Continue same dose of 1 tablet every day except 1 1/2 tablets on Sunday and Thursday.   Current Anticoagulation Instructions: INR 3.5  Continue same dose of 1 tablet every day except 1 1/2 tablets on Sunday and Thursday.  Eat a serving of [employer] today.  Cardioversion on Thursday.

## 2010-05-20 NOTE — Miscellaneous (Signed)
Summary: Zostavax/[facility]  Zostavax/[facility]   Imported By: Lanelle Bal 09/11/2009 11:42:50  _____________________________________________________________________  External Attachment:    Type:   Image     Comment:   External Document

## 2010-05-20 NOTE — Medication Information (Signed)
Summary: rov/tm  Anticoagulant Therapy  Managed by: Weston Brass, PharmD Referring MD: Eden Emms PCP: Dondra Spry DO Supervising MD: Tenny Craw MD, Gunnar Fusi Indication 1: Atrial Fibrillation Lab Used: [facility] [place] Site: [address] INR POC 2.6 INR RANGE 2.5-3.0  Dietary changes: no    Health status changes: no    Bleeding/hemorrhagic complications: no    Recent/future hospitalizations: no    Any changes in medication regimen? no    Recent/future dental: no  Any missed doses?: no       Is patient compliant with meds? yes       Allergies: No Known Drug Allergies  Anticoagulation Management History:      The patient is taking warfarin and comes in today for a routine follow up visit.  Positive risk factors for bleeding include an age of 52 years or older and presence of serious comorbidities.  The bleeding index is 'intermediate risk'.  Positive CHADS2 values include History of CHF, History of HTN, Age > 75 years old, and History of Diabetes.  His last INR was 2.30.  Anticoagulation responsible provider: Tenny Craw MD, Gunnar Fusi.  INR POC: 2.6.  Cuvette Lot#: [id number].  Exp: 11/2010.    Anticoagulation Management Assessment/Plan:      The patient's current anticoagulation dose is Coumadin 4 mg tabs: Take as directed by Coumadin Clinic (up to 1 1/2 tablets daily).  The target INR is 2.5-3.0.  The next INR is due 10/01/2009.  Anticoagulation instructions were given to patient.  Results were reviewed/authorized by Weston Brass, PharmD.  He was notified by Weston Brass PharmD.         Prior Anticoagulation Instructions: INR 2.2 Pending DCCV  on 10/03/09 need INR above 2.0   Change dose to 1 pill everyday except 1.5 pills on Sundays and Thursdays. Recheck in one week.  Current Anticoagulation Instructions: INR 2.6  Continue same dose of 1 tablet every day except 1 1/2 tablets on Sunday and Thursday.  Prescriptions: COUMADIN 4 MG TABS (WARFARIN SODIUM) Take as directed by  Coumadin Clinic (up to 1 1/2 tablets daily)  #45 x 3   Entered by:   Sally Putt PharmD   Authorized by:   Peter Charles Nishan, MD, FACC   Signed by:   Sally Putt PharmD on 09/26/2009   Method used:   Electronically to        [facility] (retail)       [address]       [place], [place]  [address]       Ph: [phone]       Fax: [phone]   RxID:   [id number]

## 2010-05-20 NOTE — Assessment & Plan Note (Signed)
Summary: f/u   Vital Signs:  Patient profile:   77 year old male Height:      67 inches Weight:      228 pounds BMI:     35.84 Temp:     97.5 degrees F oral Pulse rate:   66 / minute Pulse rhythm:   regular Resp:     16 per minute BP sitting:   118 / 70  (left arm) Cuff size:   regular  Vitals Entered By: Mervin Kung CMA Duncan Dull) (February 13, 2010 8:42 AM) CC: Follow up. , Type 2 diabetes mellitus follow-up Is Patient Diabetic? Yes Pain Assessment Patient in pain? no        Primary Care [place] Penado:  Dondra Spry DO  CC:  Follow up.  and Type 2 diabetes mellitus follow-up.  History of Present Illness: Type 2 Diabetes Mellitus Follow-Up      This is a 76 year old man who presents for Type 2 diabetes mellitus follow-up.  The patient denies weight loss.  The patient denies the following symptoms: chest pain.  Since the last visit the patient reports not monitoring blood glucose.    Current Diet: Breakfast:  oatmeal with (whole) milk,  bacon eggs Lunch: fried chicken,  chicken dumplings DInner: similar Snacks: cookies Beverage:  water, 2 sodas  htn - stable  Afib - rate controlled.  INR managed by cardiology.  no abnormal bleeding  OSA - pt diagnosed with significant OSA.  however, he can not tolerated cpap he refuses to try other masks   Preventive Screening-Counseling & Management  Alcohol-Tobacco     Alcohol drinks/day: 6 per week     Alcohol type: beer     Alcohol Counseling: to decrease amount and/or frequency of alcohol intake     Smoking Status: quit     Year Quit: 1959     Tobacco Counseling: not to resume use of tobacco products  Allergies (verified): No Known Drug Allergies  Past History:  Past Medical History: Current Problems:  CORONARY ARTERY DISEASE (ICD-414.00) CONGESTIVE HEART FAILURE (ICD-428.0) FIBRILLATION, ATRIAL (ICD-427.31)  HYPERTENSION (ICD-401.9) HYPERLIPIDEMIA (ICD-272.2)  LEG CRAMPS (ICD-729.82) BRONCHITIS  (ICD-490) SHINGLES (ICD-053.9) RENAL INSUFFICIENCY, CHRONIC (ICD-585.9) OSTEOARTHRITIS (ICD-715.90) GOUT (ICD-274.9) GERD (ICD-530.81) DIABETES MELLITUS, TYPE II (ICD-250.00)  Past Surgical History: S/P Mitral valve repair- 04/2005 S/P Cardioversion-11/02/2005  Coronary artery bypass graft-05/18/2005        Family History: Mother deceased at age 29, secondary to motor vehicle accident.  Father deceased at age 59 with a cerebral hemorrhage.  Patient has 2 brothers and 2 sisters.  There is diabetes in his siblings.       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    Physical Exam  General:  alert and overweight-appearing.   Eyes:  pupils equal, pupils round, and pupils reactive to light.   Neck:  No deformities, masses, or tenderness noted. Lungs:  normal respiratory effort, normal breath sounds, no crackles, and no wheezes.   Heart:  normal rate, no gallop, and irregular rhythm.   Extremities:  trace left pedal edema and trace right pedal edema.   Neurologic:  cranial nerves II-XII intact and gait normal.   Psych:  normally interactive and good eye contact.     Impression & Recommendations:  Problem # 1:  HYPERTENSION (ICD-401.9) Assessment Unchanged  His updated medication list for this problem includes:    Amlodipine Besylate 10 Mg Tabs (Amlodipine besylate) .Marland Kitchen... 1 tablet by mouth every morning    Coreg 6.25 Mg Tabs (Carvedilol) ..... One by mouth bid    Lisinopril-hydrochlorothiazide 20-25 Mg Tabs (Lisinopril-hydrochlorothiazide) ..... One by mouth once daily  BP today: 118/70 Prior BP: 138/79 (11/21/2009)  Labs Reviewed: K+: 4.5 (11/11/2009) Creat: : 1.6 (11/11/2009)   Chol: 118 (08/20/2008)   HDL: 34.20 (08/20/2008)   LDL: 54 (08/20/2008)   TG: 147.0  (08/20/2008)  Problem # 2:  DIABETES MELLITUS, TYPE II (ICD-250.00) Pt counseled on diet and exercise.  if A1c gets worse, consider add januvia or onglyza  His updated medication list for this problem includes:    Lisinopril-hydrochlorothiazide 20-25 Mg Tabs (Lisinopril-hydrochlorothiazide) ..... One by mouth once daily  Orders: T-Basic Metabolic Panel [phone]) T- Hemoglobin A1C [phone])  Labs Reviewed: Creat: 1.6 (11/11/2009)     Last Eye Exam: normal (12/31/2008) Reviewed HgBA1c results: 7.2 (08/20/2009)  6.4 (02/28/2009)  Problem # 3:  SLEEP APNEA, OBSTRUCTIVE (ICD-327.23) pt unable to tolerate cpap  Problem # 4:  FIBRILLATION, ATRIAL (ICD-427.31) Assessment: Unchanged  His updated medication list for this problem includes:    Amlodipine Besylate 10 Mg Tabs (Amlodipine besylate) .Marland Kitchen... 1 tablet by mouth every morning    Coreg 6.25 Mg Tabs (Carvedilol) ..... One by mouth bid    Coumadin 4 Mg Tabs (Warfarin sodium) .Marland Kitchen... Take as directed by coumadin clinic (up to 1 1/2 tablets daily)    Amiodarone Hcl 200 Mg Tabs (Amiodarone hcl) .Marland Kitchen... Take one tablet by mouth twice a day  Reviewed the following: PT: 25.1 (08/27/2009)   INR: 2.30 (08/27/2009) Coumadin Dose (weekly): 30 mg (02/06/2010) Prior Coumadin Dose (weekly): 30 mg (02/06/2010) Next Protime: 03/06/2010 (dated on 02/06/2010)  Complete Medication List: 1)  Amlodipine Besylate 10 Mg Tabs (Amlodipine besylate) .Marland Kitchen.. 1 tablet by mouth every morning 2)  Coreg 6.25 Mg Tabs (Carvedilol) .... One by mouth bid 3)  Folic Acid 1 Mg Tabs (Folic acid) .... Take 1 tablet by mouth once a day 4)  Lisinopril-hydrochlorothiazide 20-25 Mg Tabs (Lisinopril-hydrochlorothiazide) .... One by mouth once daily 5)  Lovastatin 40 Mg Tabs (Lovastatin) .... Take 2  tab by mouth at bedtime 6)  Colchicine 0.6 Mg Tabs (Colchicine) .... One by mouth once daily prn 7)  Zostavax 29562 Unt/0.88ml Solr (Zoster vaccine live) .... Administer  vaccine x 1 8)  Coumadin 4 Mg Tabs (Warfarin sodium) .... Take as directed by coumadin clinic (up to 1 1/2 tablets daily) 9)  Amiodarone Hcl 200 Mg Tabs (Amiodarone hcl) .... Take one tablet by mouth twice a day  Patient Instructions: 1)  Please schedule a follow-up appointment in 4 months.   Orders Added: 1)  T-Basic Metabolic Panel [[id number]] 2)  T- Hemoglobin A1C [[id number]] 3)  Est. Patient Level IV [13086]    Current Allergies (reviewed today): No known allergies

## 2010-05-20 NOTE — Progress Notes (Signed)
Summary: Carvedilol & Lisinopril refill  Phone Note Refill Request Message from:  Fax from Pharmacy on February 28, 2010 9:56 AM  Refills Requested: Medication #1:  COREG 6.25 MG  TABS one by mouth bid   Dosage confirmed as above?Dosage Confirmed   Brand Name Necessary? No   Supply Requested: 1 month   Last Refilled: 02/06/2010  Medication #2:  LISINOPRIL-HYDROCHLOROTHIAZIDE 20-25 MG  TABS one by mouth once daily   Dosage confirmed as above?Dosage Confirmed   Brand Name Necessary? No   Supply Requested: 1 month  Method Requested: Electronic Initial call taken by: Lannette Donath,  February 28, 2010 9:57 AM  Follow-up for Phone Call        Rx completed in Dr. Tiajuana Amass Follow-up by: [name] CMA,  February 28, 2010 1:32 PM    Prescriptions: LISINOPRIL-HYDROCHLOROTHIAZIDE 20-25 MG  TABS (LISINOPRIL-HYDROCHLOROTHIAZIDE) one by mouth once daily  #30 x 5   Entered by:   [name] CMA   Authorized by:   D. Thomos Lemons DO   Signed by:   [name] CMA on 02/28/2010   Method used:   Electronically to        [facility]* (retail)       [address]       [place], [place]  [address]       Ph: [phone]       Fax: [phone]   RxID:   [id number] COREG 6.25 MG  TABS (CARVEDILOL) one by mouth bid  #60 x 5   Entered by:   [name] CMA   Authorized by:   D. Thomos Lemons DO   Signed by:   [name] CMA on 02/28/2010   Method used:   Electronically to        [facility] (retail)       [address]       [place], [place]  [address]       Ph: [phone]       Fax: [phone]   RxID:   [id number]

## 2010-05-20 NOTE — Progress Notes (Signed)
Summary: Pt request coumadin refill  Phone Note Call from Patient Call back at [phone]   Caller: Patient Reason for Call: Refill Medication Summary of Call: Pt needs refill for coumadin, only has one pill left Initial call taken by: Lannette Donath,  Sep 12, 2009 12:08 PM  Follow-up for Phone Call        patient advised rx  sent to pharmacy Follow-up by: [name] CMA,  Sep 12, 2009 2:26 PM    Prescriptions: COUMADIN 4 MG TABS (WARFARIN SODIUM) one by mouth qd  #30 x 2   Entered and Authorized by:   D. Thomos Lemons DO   Signed by:   D. Thomos Lemons DO on 09/12/2009   Method used:   Electronically to        [facility] (retail)       [address]       [place], [place]  [address]       Ph: [phone]       Fax: [phone]   RxID:   [id number]

## 2010-05-20 NOTE — Assessment & Plan Note (Signed)
Summary: 1 MONTH FOLLOW UP/MHF--Rm 2   Vital Signs:  Patient profile:   77 year old male Height:      67 inches Weight:      227.50 pounds BMI:     35.76 Temp:     97.6 degrees F oral Pulse rate:   68 / minute Pulse rhythm:   irregular Resp:     16 per minute BP sitting:   122 / 72  (right arm) Cuff size:   large  Vitals Entered By: Mervin Kung CMA (October 01, 2009 9:15 AM) CC: rOOM 2    1 month follow up. Comments Pt states he received the Zostavax in 08/2009.  All other meds correct.   Primary Care Provider:  Dondra Spry DO  CC:  rOOM 2    1 month follow up.Marland Kitchen  History of Present Illness: 77 y/o white male for follow up  A Fib - seen by cardiology.  coumadin clinic following.  INR therapeutic cardioversion planned  Htn - stable  hx of obesity,  hx of snoring.   short sleep latency  Allergies (verified): No Known Drug Allergies  Past History:  Past Medical History: Current Problems:  CORONARY ARTERY DISEASE (ICD-414.00) CONGESTIVE HEART FAILURE (ICD-428.0) FIBRILLATION, ATRIAL (ICD-427.31)  HYPERTENSION (ICD-401.9) HYPERLIPIDEMIA (ICD-272.2) LEG CRAMPS (ICD-729.82) BRONCHITIS (ICD-490) SHINGLES (ICD-053.9) RENAL INSUFFICIENCY, CHRONIC (ICD-585.9) OSTEOARTHRITIS (ICD-715.90) GOUT (ICD-274.9) GERD (ICD-530.81) DIABETES MELLITUS, TYPE II (ICD-250.00)  Past Surgical History: S/P Mitral valve repair- 04/2005 S/P Cardioversion-11/02/2005  Coronary artery bypass graft-05/18/2005       Family History: Mother deceased at age 34, secondary to motor vehicle accident.  Father deceased at age 45 with a cerebral hemorrhage.  Patient has 2 brothers and 2 sisters.  There is diabetes in his siblings.      Social History: Married but separated from wife Alcohol use-yes Former Smoker quit 40 yrs ago (40 pack yr history)   Works part time at [employer] near [employer]  Physical Exam  General:  alert, well-developed, and well-nourished.   Neck:   supple and no masses.   Lungs:  normal respiratory effort, normal breath sounds, no crackles, and no wheezes.   Heart:  normal rate, no gallop, and irregular rhythm.   Extremities:  trace left pedal edema and trace right pedal edema.     Impression & Recommendations:  Problem # 1:  FIBRILLATION, ATRIAL (ICD-427.31) rate controlled and anticoagulated.  cardiology planning cardioversion.  coumadin make him feel cold  His updated medication list for this problem includes:    Amlodipine Besylate 10 Mg Tabs (Amlodipine besylate) .Marland Kitchen... 1 tablet by mouth every morning    Coreg 6.25 Mg Tabs (Carvedilol) ..... One by mouth bid    Coumadin 4 Mg Tabs (Warfarin sodium) .Marland Kitchen... Take as directed by coumadin clinic (up to 1 1/2 tablets daily)    Amiodarone Hcl 200 Mg Tabs (Amiodarone hcl) .Marland Kitchen... Take one tablet by mouth twice a day  Problem # 2:  HYPERTENSION (ICD-401.9) well controlled.  Maintain current medication regimen.  His updated medication list for this problem includes:    Amlodipine Besylate 10 Mg Tabs (Amlodipine besylate) .Marland Kitchen... 1 tablet by mouth every morning    Coreg 6.25 Mg Tabs (Carvedilol) ..... One by mouth bid    Lisinopril-hydrochlorothiazide 20-25 Mg Tabs (Lisinopril-hydrochlorothiazide) ..... One by mouth once daily  BP today: 122/72 Prior BP: 143/85 (09/19/2009)  Labs Reviewed: K+: 4.7 (08/20/2009) Creat: : 1.33 (08/20/2009)   Chol: 118 (08/20/2008)   HDL: 34.20 (  08/20/2008)   LDL: 54 (08/20/2008)   TG: 147.0 (08/20/2008)  Problem # 3:  SNORING (ICD-786.09)  His updated medication list for this problem includes:    Coreg 6.25 Mg Tabs (Carvedilol) ..... One by mouth bid    Lisinopril-hydrochlorothiazide 20-25 Mg Tabs (Lisinopril-hydrochlorothiazide) ..... One by mouth once daily  Orders: Sleep Disorder Referral (Sleep Disorder)  Complete Medication List: 1)  Amlodipine Besylate 10 Mg Tabs (Amlodipine besylate) .Marland Kitchen.. 1 tablet by mouth every morning 2)  Coreg 6.25 Mg Tabs  (Carvedilol) .... One by mouth bid 3)  Folic Acid 1 Mg Tabs (Folic acid) .... Take 1 tablet by mouth once a day 4)  Lisinopril-hydrochlorothiazide 20-25 Mg Tabs (Lisinopril-hydrochlorothiazide) .... One by mouth once daily 5)  Lovastatin 40 Mg Tabs (Lovastatin) .... Take 2  tab by mouth at bedtime 6)  Colchicine 0.6 Mg Tabs (Colchicine) .... One by mouth once daily prn 7)  Zostavax [address] Unt/0.40ml Solr (Zoster vaccine live) .... Administer vaccine x 1 8)  Coumadin 4 Mg Tabs (Warfarin sodium) .... Take as directed by coumadin clinic (up to 1 1/2 tablets daily) 9)  Amiodarone Hcl 200 Mg Tabs (Amiodarone hcl) .... Take one tablet by mouth twice a day  Patient Instructions: 1)  Please schedule a follow-up appointment in 4 months.       Current Allergies (reviewed today): No known allergies

## 2010-05-20 NOTE — Letter (Signed)
Summary: Primary Care Consult Scheduled Letter  [place] at [facility]  [address]   [place], [place] [address]   Phone: [phone]  Fax: [phone]      08/20/2009 MRN: [id number]  Javier [name] [address] [place], [place]  [address]    Dear Mr. Yochim,      We have scheduled an appointment for you.  At the recommendation of Dr.YOO, we have scheduled you a consult with DR Denton Lank  on MAY [phone] at 10:15AM .  Their address is[address],[place] . The office phone number is [phone].  If this appointment day and time is not convenient for you, please feel free to call the office of the doctor you are being referred to at the number listed above and reschedule the appointment.     It is important for you to keep your scheduled appointments. We are here to make sure you are given good patient care. If you have questions or you have made changes to your appointment, please notify us at  [phone], ask for HELEN.    Thank you,  Patient Care Coordinator [place] at [facility]

## 2010-05-20 NOTE — Medication Information (Signed)
Summary: rov/ewj  Anticoagulant Therapy  Managed by: Weston Brass, PharmD Referring MD: Eden Emms PCP: Dondra Spry DO Supervising MD: Shirlee Latch MD, Tyrique Sporn Indication 1: Atrial Fibrillation Lab Used: [facility] [place] Site: [address] INR POC 4.9 INR RANGE 2.5-3.0  Dietary changes: no    Health status changes: no    Bleeding/hemorrhagic complications: no    Recent/future hospitalizations: no    Any changes in medication regimen? no    Recent/future dental: no  Any missed doses?: no       Is patient compliant with meds? yes       Allergies: No Known Drug Allergies  Anticoagulation Management History:      The patient is taking warfarin and comes in today for a routine follow up visit.  Positive risk factors for bleeding include an age of 75 years or older and presence of serious comorbidities.  The bleeding index is 'intermediate risk'.  Positive CHADS2 values include History of CHF, History of HTN, Age > 5 years old, and History of Diabetes.  His last INR was 2.30.  Anticoagulation responsible provider: Shirlee Latch MD, Tajai Suder.  INR POC: 4.9.  Cuvette Lot#: [id number].  Exp: 01/2011.    Anticoagulation Management Assessment/Plan:      The patient's current anticoagulation dose is Coumadin 4 mg tabs: Take as directed by Coumadin Clinic (up to 1 1/2 tablets daily).  The target INR is 2.5-3.0.  The next INR is due 11/21/2009.  Anticoagulation instructions were given to patient.  Results were reviewed/authorized by Weston Brass, PharmD.  He was notified by Weston Brass PharmD.         Prior Anticoagulation Instructions: INR 2.6  Continue on same dosage 4mg  daily except 6mg  on Sundays and Thursdays.  Recheck in 4 weeks.    Current Anticoagulation Instructions: INR 4.9  Skip Friday and Saturday's dose of Coumadin then resume same dose of 1 tablet every day except 1 1/2 tablets on Sunday and Thursday.

## 2010-05-20 NOTE — Medication Information (Signed)
Summary: rov/sp  Anticoagulant Therapy  Managed by: Bethena Midget, RN, BSN Referring MD: Eden Emms PCP: Dondra Spry DO Supervising MD: Graciela Husbands MD, Viviann Spare Indication 1: Atrial Fibrillation Lab Used: [facility] [place] Site: [address] INR POC 2.2 INR RANGE 2.5-3.0  Dietary changes: no    Health status changes: no    Bleeding/hemorrhagic complications: no    Recent/future hospitalizations: no    Any changes in medication regimen? no    Recent/future dental: no  Any missed doses?: no       Is patient compliant with meds? yes      Comments: Saw Dr Eden Emms today. Pending DCCV on 10/03/09  Allergies: No Known Drug Allergies  Anticoagulation Management History:      The patient is taking warfarin and comes in today for a routine follow up visit.  Positive risk factors for bleeding include an age of 75 years or older and presence of serious comorbidities.  The bleeding index is 'intermediate risk'.  Positive CHADS2 values include History of CHF, History of HTN, Age > 36 years old, and History of Diabetes.  His last INR was 2.30.  Anticoagulation responsible provider: Graciela Husbands MD, Viviann Spare.  INR POC: 2.2.  Cuvette Lot#: [id number].  Exp: 11/2010.    Anticoagulation Management Assessment/Plan:      The patient's current anticoagulation dose is Coumadin 4 mg tabs: one by mouth qd.  The target INR is 2.5-3.0.  The next INR is due 09/26/2009.  Anticoagulation instructions were given to patient.  Results were reviewed/authorized by Bethena Midget, RN, BSN.  He was notified by Bethena Midget, RN, BSN.         Prior Anticoagulation Instructions: INR 2.3  Increase dose to 1 tablet every day except 1 1/2 tablets on Thursday.   Current Anticoagulation Instructions: INR 2.2 Pending DCCV  on 10/03/09 need INR above 2.0   Change dose to 1 pill everyday except 1.5 pills on Sundays and Thursdays. Recheck in one week.

## 2010-05-20 NOTE — Letter (Signed)
Summary: Primary Care Consult Scheduled Letter  [place] at [facility]  [address]   [place], [place] [address]   Phone: [phone]  Fax: [phone]      01/06/2010 MRN: [id number]  Javier [name] [address] [place], [place]  [address]    Dear [name],      We have scheduled an appointment for you.  At the recommendation of Dr.YOO, we have scheduled you a consult with DR Lanny Cramp  PULMONARY  on January 14, 2010 at 11AM.  Their address is[address], [place]  [address]. The office phone number is [phone].  If this appointment day and time is not convenient for you, please feel free to call the office of the doctor you are being referred to at the number listed above and reschedule the appointment.     It is important for you to keep your scheduled appointments. We are here to make sure you are given good patient care. If you have questions or you have made changes to your appointment, please notify us at  [phone], ask for  HELEN.    Thank you,  Darral Dash Patient Care Coordinator  at [facility]

## 2010-05-20 NOTE — Medication Information (Signed)
Summary: rov/sp  Anticoagulant Therapy  Managed by: Cloyde Reams, RN, BSN Referring MD: Eden Emms PCP: Dondra Spry DO Supervising MD: Tenny Craw MD, Gunnar Fusi Indication 1: Atrial Fibrillation Lab Used: [facility] [place] Site: [address] INR POC 3.5 INR RANGE 2.5-3.0   Health status changes: no    Bleeding/hemorrhagic complications: no    Recent/future hospitalizations: no    Any changes in medication regimen? no    Recent/future dental: no  Any missed doses?: no       Is patient compliant with meds? yes       Allergies: No Known Drug Allergies  Anticoagulation Management History:      The patient is taking warfarin and comes in today for a routine follow up visit.  Positive risk factors for bleeding include an age of 3 years or older and presence of serious comorbidities.  The bleeding index is 'intermediate risk'.  Positive CHADS2 values include History of CHF, History of HTN, Age > 66 years old, and History of Diabetes.  His last INR was 2.30.  Anticoagulation responsible provider: Tenny Craw MD, Gunnar Fusi.  INR POC: 3.5.  Cuvette Lot#: [id number].  Exp: 01/2011.    Anticoagulation Management Assessment/Plan:      The patient's current anticoagulation dose is Coumadin 4 mg tabs: Take as directed by Coumadin Clinic (up to 1 1/2 tablets daily).  The target INR is 2.5-3.0.  The next INR is due 12/12/2009.  Anticoagulation instructions were given to patient.  Results were reviewed/authorized by Cloyde Reams, RN, BSN.  He was notified by Cloyde Reams RN.         Prior Anticoagulation Instructions: INR 4.9  Skip Friday and Saturday's dose of Coumadin then resume same dose of 1 tablet every day except 1 1/2 tablets on Sunday and Thursday.   Current Anticoagulation Instructions: INR 3.5  Take 1/2 tablet tomorrow, then start taking 1 tablet daily except 1.5 tablets on Thursdays.  Recheck in 3 weeks.

## 2010-05-20 NOTE — Letter (Signed)
Summary: Cardioversion/TEE Instructions  [facility], Main Office  [address]   [place], [place] [address]   Phone: [phone]  Fax: [phone]    Cardioversion  Instructions  You are scheduled for a Cardioversion on THURSDAY 10-03-09 with Dr. Eden Emms.   Please arrive at the [facility] at  12 NOON on the day of your procedure.  1)   DIET:  A)   Nothing to eat or drink after midnight except your medications with a sip of water.  B)   May have clear liquid breakfast, then nothing to eat or drink after _________ a.m. / p.m.      Clear liquids include:  water, broth, Sprite, Ginger Ale, black coffee, tea (no sugar),      cranberry / grape / apple juice, jello (not red), popsicle from clear juices (not red).  2)   Come to the [place] office on ____________________ for lab work. The lab at [facility] is open from 8:30 a.m. to 1:30 p.m. and 2:30 p.m. to 5:00 p.m. The lab at [facility] is open from 7:30 a.m. to 5:30 p.m. You do not have to be fasting.  3)   MAKE SURE YOU TAKE YOUR COUMADIN.  4)   A)   DO NOT TAKE these medications before your procedure:      ___________________________________________________________________     ___________________________________________________________________     ___________________________________________________________________  B)   YOU MAY TAKE ALL of your remaining medications with a small amount of water.    C)   START NEW medications:       ___________________________________________________________________     ___________________________________________________________________  5)  Must have a responsible person to drive you home.  6)   Bring a current list of your medications and current insurance cards.   * Special Note:  Every effort is made to have your procedure done on time. Occasionally there are emergencies that present themselves at the hospital that  may cause delays. Please be patient if a delay does occur.  * If you have any questions after you get home, please call the office at [phone].

## 2010-05-22 NOTE — Progress Notes (Signed)
Summary: Medication Refill  Phone Note Refill Request Message from:  Fax from Pharmacy on April 02, 2010 9:23 AM  Refills Requested: Medication #1:  AMLODIPINE BESYLATE 10 MG TABS 1 tablet by mouth every morning   Dosage confirmed as above?Dosage Confirmed   Brand Name Necessary? No   Supply Requested: 1 month   Last Refilled: 02/28/2010  Medication #2:  FOLIC ACID 1 MG TABS Take 1 tablet by mouth once a day   Dosage confirmed as above?Dosage Confirmed   Brand Name Necessary? No   Supply Requested: 1 month   Last Refilled: 02/28/2010 [facility] [address] [place] [address] fax [phone]   Method Requested: Electronic Next Appointment Scheduled: 04-03-10 coumadin Initial call taken by: Roselle Locus,  April 02, 2010 9:24 AM  Follow-up for Phone Call        Rx completed in Dr. Tiajuana Amass Follow-up by: [name] CMA,  April 02, 2010 9:29 AM    Prescriptions: FOLIC ACID 1 MG TABS (FOLIC ACID) Take 1 tablet by mouth once a day  #32 x 3   Entered by:   [name] CMA   Authorized by:   D. Thomos Lemons DO   Signed by:   [name] CMA on 04/02/2010   Method used:   Electronically to        [facility]* (retail)       [address]       [place], [place]  [address]       Ph: [phone]       Fax: [phone]   RxID:   [id number] AMLODIPINE BESYLATE 10 MG TABS (AMLODIPINE BESYLATE) 1 tablet by mouth every morning  #30 x 3   Entered by:   [name] CMA   Authorized by:   D. Thomos Lemons DO   Signed by:   [name] CMA on 04/02/2010   Method used:   Electronically to        [facility] (retail)       [address]       [place], [place]  [address]       Ph: [phone]       Fax: [phone]   RxID:   [id number]

## 2010-05-22 NOTE — Progress Notes (Signed)
Summary: refill request   Phone Note Refill Request Message from:  Patient on May 05, 2010 1:09 PM  Refills Requested: Medication #1:  LOVASTATIN 40 MG TABS Take 2  tab by mouth at bedtime [facility]   Method Requested: Telephone to Pharmacy Initial call taken by: Glynda Jaeger,  May 05, 2010 1:09 PM    Prescriptions: LOVASTATIN 40 MG TABS (LOVASTATIN) Take 2  tab by mouth at bedtime  #60 x 12   Entered by:   Kem Parkinson   Authorized by:   Colon Branch, MD, [facility]   Signed by:   Kem Parkinson on 05/06/2010   Method used:   Electronically to        [facility] (retail)       [address]       [place], [place]  [address]       Ph: [phone]       Fax: [phone]   RxID:   [id number]

## 2010-05-22 NOTE — Medication Information (Signed)
Summary: rov/tm  Anticoagulant Therapy  Managed by: Cloyde Reams, RN, BSN Referring MD: Eden Emms PCP: Dondra Spry DO Supervising MD: Tenny Craw MD, Gunnar Fusi Indication 1: Atrial Fibrillation Lab Used: [facility] [place] Site: [address] INR POC 3.2 INR RANGE 2.5-3.0  Dietary changes: no    Health status changes: no    Bleeding/hemorrhagic complications: no    Recent/future hospitalizations: no    Any changes in medication regimen? no    Recent/future dental: no  Any missed doses?: no       Is patient compliant with meds? yes       Allergies: No Known Drug Allergies  Anticoagulation Management History:      The patient is taking warfarin and comes in today for a routine follow up visit.  Positive risk factors for bleeding include an age of 75 years or older and presence of serious comorbidities.  The bleeding index is 'intermediate risk'.  Positive CHADS2 values include History of CHF, History of HTN, Age > 75 years old, and History of Diabetes.  His last INR was 2.30.  Anticoagulation responsible provider: Tenny Craw MD, Gunnar Fusi.  INR POC: 3.2.  Cuvette Lot#: [id number].  Exp: 04/2011.    Anticoagulation Management Assessment/Plan:      The patient's current anticoagulation dose is Coumadin 4 mg tabs: Take as directed by [facility] Clinic (up to 1 1/2 tablets daily).  The target INR is 2.5-3.0.  The next INR is due 05/29/2010.  Anticoagulation instructions were given to patient.  Results were reviewed/authorized by Cloyde Reams, RN, BSN.  He was notified by Cloyde Reams RN.         Prior Anticoagulation Instructions: INR 2.0 Today take extra 1/2 pill then change dose to 1 pill everyday except 1.5 pills on Sundays and Thursdays. Recheck in 2 weeks.   Current Anticoagulation Instructions: INR 3.2  Take 1/2 tablet tomorrow, then start taking 1 tablet daily except 1.5 tablets on Thursdays.  Recheck in 3 weeks.

## 2010-05-22 NOTE — Medication Information (Signed)
Summary: rov/nb   Anticoagulant Therapy  Managed by: Reina Fuse, PharmD Referring MD: Eden Emms PCP: Dondra Spry DO Supervising MD: Nahser Indication 1: Atrial Fibrillation Lab Used: [facility] [place] Site: [address] INR POC 4.0 INR RANGE 2.5-3.0  Dietary changes: no    Health status changes: no    Bleeding/hemorrhagic complications: no    Recent/future hospitalizations: no    Any changes in medication regimen? no    Recent/future dental: no  Any missed doses?: no       Is patient compliant with meds? yes      Comments: Pt reports taking 1.5 tabs on Sun and Thur rather than 1.5 tabs only on Thursday. Of note, the days he takes an extra half he takes his whole pill in the morning and half in the evening.   Allergies: No Known Drug Allergies  Anticoagulation Management History:      The patient is taking warfarin and comes in today for a routine follow up visit.  Positive risk factors for bleeding include an age of 18 years or older and presence of serious comorbidities.  The bleeding index is 'intermediate risk'.  Positive CHADS2 values include History of CHF, History of HTN, Age > 37 years old, and History of Diabetes.  His last INR was 2.30.  Anticoagulation responsible provider: Nahser.  INR POC: 4.0.  Cuvette Lot#: [id number].  Exp: 02/2011.    Anticoagulation Management Assessment/Plan:      The patient's current anticoagulation dose is Coumadin 4 mg tabs: Take as directed by Coumadin Clinic (up to 1 1/2 tablets daily).  The target INR is 2.5-3.0.  The next INR is due 04/24/2010.  Anticoagulation instructions were given to patient.  Results were reviewed/authorized by Reina Fuse, PharmD.  He was notified by Reina Fuse PharmD.         Prior Anticoagulation Instructions: INR 2.5 Continue previous dose of 1 tablet everyday except 1.5 tablet on Thursday Recheck INR in 4 weeks  Current Anticoagulation Instructions: INR 4.0  Do not take extra 0.5 tab  today and do not take Coumadin tomorrow, Friday, December 16th. Then, take Coumadin 1 tab (4 mg) on all days except for Coumadin 1.5 tabs (6 mg) on Thursdays. Return to clinic in 3 weeks.

## 2010-05-22 NOTE — Medication Information (Signed)
Summary: rov/sl  Anticoagulant Therapy  Managed by: Bethena Midget, RN, BSN Referring MD: Eden Emms PCP: Dondra Spry DO Supervising MD: Tenny Craw MD, Gunnar Fusi Indication 1: Atrial Fibrillation Lab Used: [facility] [place] Site: [address] INR POC 2.0 INR RANGE 2.5-3.0  Dietary changes: no    Health status changes: no    Bleeding/hemorrhagic complications: no    Recent/future hospitalizations: no    Any changes in medication regimen? no    Recent/future dental: no  Any missed doses?: no       Is patient compliant with meds? yes       Allergies: No Known Drug Allergies  Anticoagulation Management History:      The patient is taking warfarin and comes in today for a routine follow up visit.  Positive risk factors for bleeding include an age of 75 years or older and presence of serious comorbidities.  The bleeding index is 'intermediate risk'.  Positive CHADS2 values include History of CHF, History of HTN, Age > 13 years old, and History of Diabetes.  His last INR was 2.30.  Anticoagulation responsible provider: Tenny Craw MD, Gunnar Fusi.  INR POC: 2.0.  Cuvette Lot#: [id number].  Exp: 05/2011.    Anticoagulation Management Assessment/Plan:      The patient's current anticoagulation dose is Coumadin 4 mg tabs: Take as directed by [facility]madin Clinic (up to 1 1/2 tablets daily).  The target INR is 2.5-3.0.  The next INR is due 05/08/2010.  Anticoagulation instructions were given to patient.  Results were reviewed/authorized by Bethena Midget, RN, BSN.  He was notified by Bethena Midget, RN, BSN.         Prior Anticoagulation Instructions: INR 4.0  Do not take extra 0.5 tab today and do not take Coumadin tomorrow, Friday, December 16th. Then, take Coumadin 1 tab (4 mg) on all days except for Coumadin 1.5 tabs (6 mg) on Thursdays. Return to clinic in 3 weeks.   Current Anticoagulation Instructions: INR 2.0 Today take extra 1/2 pill then change dose to 1 pill everyday except 1.5 pills on  Sundays and Thursdays. Recheck in 2 weeks.

## 2010-05-29 ENCOUNTER — Ambulatory Visit (INDEPENDENT_AMBULATORY_CARE_PROVIDER_SITE_OTHER): Payer: Medicare Other | Admitting: Internal Medicine

## 2010-05-29 ENCOUNTER — Ambulatory Visit (HOSPITAL_BASED_OUTPATIENT_CLINIC_OR_DEPARTMENT_OTHER)
Admission: RE | Admit: 2010-05-29 | Discharge: 2010-05-29 | Disposition: A | Discharge status: Home / Self Care | Payer: Medicare Other | Source: Ambulatory Visit | Attending: Internal Medicine | Admitting: Internal Medicine

## 2010-05-29 ENCOUNTER — Encounter: Payer: Self-pay | Admitting: Internal Medicine

## 2010-05-29 ENCOUNTER — Telehealth: Payer: Self-pay | Admitting: Internal Medicine

## 2010-05-29 ENCOUNTER — Other Ambulatory Visit: Payer: Self-pay | Admitting: Internal Medicine

## 2010-05-29 DIAGNOSIS — R0902 Hypoxemia: Secondary | ICD-10-CM

## 2010-05-29 DIAGNOSIS — T148XXA Other injury of unspecified body region, initial encounter: Secondary | ICD-10-CM | POA: Insufficient documentation

## 2010-05-29 DIAGNOSIS — L0291 Cutaneous abscess, unspecified: Secondary | ICD-10-CM

## 2010-05-29 DIAGNOSIS — L0390 Cellulitis, unspecified: Secondary | ICD-10-CM

## 2010-05-29 DIAGNOSIS — I4891 Unspecified atrial fibrillation: Secondary | ICD-10-CM

## 2010-05-29 DIAGNOSIS — S2249XA Multiple fractures of ribs, unspecified side, initial encounter for closed fracture: Secondary | ICD-10-CM | POA: Insufficient documentation

## 2010-05-29 DIAGNOSIS — W19XXXA Unspecified fall, initial encounter: Secondary | ICD-10-CM | POA: Insufficient documentation

## 2010-05-29 LAB — CONVERTED CEMR LAB
Hgb A1c MFr Bld: 6.4 % — ABNORMAL HIGH (ref <5.7)
INR: 5.5 (ref <1.50)
MCHC: 32.3 g/dL (ref 30.0–36.0)
Prothrombin Time: 49.7 s — ABNORMAL HIGH (ref 11.6–15.2)
RBC: 4.24 M/uL (ref 4.22–5.81)
TSH: 2.145 microintl units/mL (ref 0.350–4.500)
WBC: 6.2 10*3/uL (ref 4.0–10.5)

## 2010-05-30 ENCOUNTER — Telehealth: Payer: Self-pay | Admitting: Internal Medicine

## 2010-06-02 ENCOUNTER — Encounter: Payer: Self-pay | Admitting: Internal Medicine

## 2010-06-03 ENCOUNTER — Ambulatory Visit (INDEPENDENT_AMBULATORY_CARE_PROVIDER_SITE_OTHER): Payer: Medicare Other | Admitting: Cardiovascular Disease

## 2010-06-03 ENCOUNTER — Encounter: Payer: Self-pay | Admitting: Cardiovascular Disease

## 2010-06-03 DIAGNOSIS — I059 Rheumatic mitral valve disease, unspecified: Secondary | ICD-10-CM

## 2010-06-03 DIAGNOSIS — Z9889 Other specified postprocedural states: Secondary | ICD-10-CM

## 2010-06-03 DIAGNOSIS — I10 Essential (primary) hypertension: Secondary | ICD-10-CM

## 2010-06-03 DIAGNOSIS — I4891 Unspecified atrial fibrillation: Secondary | ICD-10-CM

## 2010-06-11 NOTE — Progress Notes (Signed)
Summary: Call A Nurse  Phone Note Other Incoming   Caller: Call A Nurse fax Summary of Call: Faxed To: [facility] Caller: Maryu/ [facility] Fax Number: [phone] Facility: [facility] Patient: Javier [name], Javier [name] DOB: 10/02/1929 Phone: [phone] Provider: Message: Jola Babinski is calling from [facility] regarding a PT/INR ordered on [place] by Dr Artist Pais. The result was a critical high result of 49.7 / 5.5 . Faxed to office. Initial call taken by: Mervin Kung CMA Duncan Dull),  May 30, 2010 8:14 AM  Follow-up for Phone Call        plz contact pt - instruct him to hold coumadin until he is seen by Dr. Eden Emms on Tuesday Follow-up by: D. Thomos Lemons DO,  May 30, 2010 11:02 AM  Additional Follow-up for Phone Call Additional follow up Details #1::        Left message on machine to return my call. Nicki Guadalajara Fergerson CMA Duncan Dull)  May 30, 2010 11:27 AM  Mervin Kung CMA Duncan Dull)  May 30, 2010 11:27 AM     Additional Follow-up for Phone Call Additional follow up Details #2::    Pt's wife returned my call stating pt does not live at that number any longer and does not have a phone. She will call her son and have him to relay info to pt. She has been advised per Dr Olegario Messier instruction and I have asked her to have pt call us when his son gets to his house so we can explain results to pt. Nicki Guadalajara Fergerson CMA Duncan Dull)  May 30, 2010 11:43 AM

## 2010-06-11 NOTE — Letter (Signed)
Summary: Generic Letter  [facility]  [address]   [place], [place] [address]   Phone: [phone]  Fax: [phone]      06/02/2010     RIZWAN KUYPER [address] [place], [place]  [address]  [place]      Dear Mr. Wiemers,  There have been several attempts to contact you at [phone] with out success. Dr Artist Pais asked to inform you the radiologist notes right sided rib fractures 6,7, and possibly 8, and may be a higher risk for developing pneumonia. Dr Artist Pais would like for you to call the office if develope a fever of cough.  If there are any questions regarding this letter, please contact our office.         Sincerely,   [name] CMA Dr Thomos Lemons

## 2010-06-11 NOTE — Assessment & Plan Note (Signed)
Summary: F6M/DM/CT appt confirm=mj  Medications Added COUMADIN 4 MG TABS (WARFARIN SODIUM) HOLD....Marland KitchenMarland KitchenTake as directed by Coumadin Clinic (up to 1 1/2 tablets daily)      Allergies Added: NKDA  Primary Provider:  Dondra Spry DO  CC:  check up...pt states his coumadin is on hold due to lab work from Dr. Artist Pais office.  History of Present Illness: Javier [name] is seen today for F/U of afib, MV dx and CAD   He has a history of mitral valve repair with 2V  bypass in October, 2006.  This included LIMA to LAD and SVG to D2   He has had PAF.  He has hypertension, type 2 diabetes  He had been on pacerone in 2007 and had [facility] x1.  He was found to be back in afib by Dr Artist Pais on 5/3.  He was started on coumadin and referred for eval.  He had successful [facility] on 10/08/09.  His INR has been Rx and followed in clinic with appt today  He continue to have moderate alcholol intake    He fell last week with marked bruising of the right ribs and knee.  Xrays negative.  Coumadin appropriately stopped  Would restart in 7-10 days if knee improving and does not need to be drained.    Current Problems (verified): 1)  Cellulitis  (ICD-682.9) 2)  Hypoxemia  (ICD-799.02) 3)  Hematoma  (ICD-924.9) 4)  Sleep Apnea, Obstructive  (ICD-327.23) 5)  Snoring  (ICD-786.09) 6)  Coumadin Therapy  (ICD-V58.61) 7)  Mitral Valve Replacement, Hx of  (ICD-V15.1) 8)  Coronary Artery Disease  (ICD-414.00) 9)  Congestive Heart Failure  (ICD-428.0) 10)  Fibrillation, Atrial  (ICD-427.31) 11)  Hypertension  (ICD-401.9) 12)  Hyperlipidemia  (ICD-272.2) 13)  Leg Cramps  (ICD-729.82) 14)  Bronchitis  (ICD-490) 15)  Shingles  (ICD-053.9) 16)  Renal Insufficiency, Chronic  (ICD-585.9) 17)  Osteoarthritis  (ICD-715.90) 18)  Gout  (ICD-274.9) 19)  Gerd  (ICD-530.81) 20)  Diabetes Mellitus, Type II  (ICD-250.00)  Current Medications (verified): 1)  Amlodipine Besylate 10 Mg Tabs (Amlodipine Besylate) .Marland Kitchen.. 1 Tablet By Mouth Every Morning 2)   Coreg 6.25 Mg  Tabs (Carvedilol) .... One By Mouth Bid 3)  Folic Acid 1 Mg Tabs (Folic Acid) .... Take 1 Tablet By Mouth Once A Day 4)  Lisinopril-Hydrochlorothiazide 20-25 Mg  Tabs (Lisinopril-Hydrochlorothiazide) .... One By Mouth Once Daily 5)  Lovastatin 40 Mg Tabs (Lovastatin) .... Take 2  Tab By Mouth At Bedtime 6)  Colchicine 0.6 Mg Tabs (Colchicine) .... One By Mouth Once Daily Prn 7)  Coumadin 4 Mg Tabs (Warfarin Sodium) .... Hold.....Marland Kitchentake As Directed By Coumadin Clinic (Up To 1 1/2 Tablets Daily) 8)  Cephalexin 500 Mg Caps (Cephalexin) .... One By Mouth Three Times A Day  Allergies (verified): No Known Drug Allergies  Past History:  Past Medical History: Last updated: 03/14/10 Current Problems:  CORONARY ARTERY DISEASE (ICD-414.00) CONGESTIVE HEART FAILURE (ICD-428.0) FIBRILLATION, ATRIAL (ICD-427.31)  HYPERTENSION (ICD-401.9) HYPERLIPIDEMIA (ICD-272.2)  LEG CRAMPS (ICD-729.82) BRONCHITIS (ICD-490) SHINGLES (ICD-053.9) RENAL INSUFFICIENCY, CHRONIC (ICD-585.9) OSTEOARTHRITIS (ICD-715.90) GOUT (ICD-274.9) GERD (ICD-530.81) DIABETES MELLITUS, TYPE II (ICD-250.00)  Past Surgical History: Last updated: 14-Mar-2010 S/P Mitral valve repair- 04/2005 S/P Cardioversion-11/02/2005  Coronary artery bypass graft-05/18/2005        Family History: Last updated: 03/14/2010 Mother deceased at age 16, secondary to motor vehicle accident.  Father deceased at age 66 with a cerebral hemorrhage.  Patient has 2 brothers and 2 sisters.  There is diabetes in his  siblings.       Social History: Last updated: 02/13/2010 Married but separated from wife Alcohol use-yes Former Smoker quit 40 yrs ago (40 pack yr history)   Works part time at [employer] near [employer]    Review of Systems       Denies fever, malais, weight loss, blurry vision, decreased visual acuity, cough, sputum, SOB, hemoptysis, pleuritic pain, palpitaitons, heartburn, abdominal pain, melena, lower  extremity edema, claudication, or rash.   Vital Signs:  Patient profile:   77 year old male Height:      67 inches Weight:      212 pounds BMI:     33.32 Pulse rate:   64 / minute Resp:     16 per minute BP sitting:   131 / 66  (left arm)  Vitals Entered By: Kem Parkinson (June 03, 2010 9:58 AM)  Physical Exam  General:  Affect appropriate Healthy:  appears stated age HEENT: normal Neck supple with no adenopathy JVP normal no bruits no thyromegaly Lungs clear with no wheezing and good diaphragmatic motion Heart:  S1/S2 SEM  murmur,rub, gallop or click PMI normal Abdomen: benighn, BS positve, no tenderness, no AAA no bruit.  No HSM or HJR Distal pulses intact with no bruits No edema Neuro non-focal Skin warm and dry Effusion and bruising over right knee and echymosis over right upper ribs   Impression & Recommendations:  Problem # 1:  HEMATOMA (ICD-924.9) Hold coumadin until next week  Has F/U with Dr Artist Pais Maint NSR so risk of embolic event low.  Would hold coumadin as long as needed   Problem # 2:  MITRAL VALVE REPLACEMENT, HX OF (ICD-V15.1) No symptoms.  Residual murmur of AV sclerosis.  F/U echo in a year.    Problem # 3:  FIBRILLATION, ATRIAL (ICD-427.31) S/P DCC maint NSR His updated medication list for this problem includes:    Coreg 6.25 Mg Tabs (Carvedilol) ..... One by mouth bid    Coumadin 4 Mg Tabs (Warfarin sodium) ..... Hold.....Marland Kitchentake as directed by coumadin clinic (up to 1 1/2 tablets daily)  Problem # 4:  HYPERTENSION (ICD-401.9) Well controlled His updated medication list for this problem includes:    Amlodipine Besylate 10 Mg Tabs (Amlodipine besylate) .Marland Kitchen... 1 tablet by mouth every morning    Coreg 6.25 Mg Tabs (Carvedilol) ..... One by mouth bid    Lisinopril-hydrochlorothiazide 20-25 Mg Tabs (Lisinopril-hydrochlorothiazide) ..... One by mouth once daily  Patient Instructions: 1)  Your physician wants you to follow-up in: 6 MONTHS  You  will receive a reminder letter in the mail two months in advance. If you don't receive a letter, please call our office to schedule the follow-up appointment.

## 2010-06-11 NOTE — Progress Notes (Signed)
Summary: Lab Results  Phone Note Outgoing Call   Summary of Call: call pt - radiologist notes right sided rib fractures 6, 7 and possibly 8 pt may be higher risk for developing pneumonia plz advise pt to call if he develops fever or cough Initial call taken by: D. Thomos Lemons DO,  May 29, 2010 1:28 PM  Follow-up for Phone Call        Left message on machine to return my call. Nicki Guadalajara Fergerson CMA Duncan Dull)  May 29, 2010 2:11 PM   Additional Follow-up for Phone Call Additional follow up Details #1::        call placed to phone number on file [phone], spoke with patients wife, she states patient does not live with her,he stays a few miles away, and she is not sure when she will see him. She was asked about another contact number for the patient. She states she doe not have another number for him. She was asked when she speaks with the patient, it was very important that she have him return the call to Dr Waynette Buttery office. She has verbalized understanding and agrees. Additional Follow-up by: [name] CMA,  May 30, 2010 8:40 AM    Additional Follow-up for Phone Call Additional follow up Details #2::    Letter mailed to address on file Follow-up by: [name] CMA,  June 02, 2010 11:27 AM

## 2010-06-17 NOTE — Assessment & Plan Note (Signed)
Summary: 6 week fu/mhf   Vital Signs:  Patient profile:   77 year old male Height:      67 inches Weight:      215.75 pounds BMI:     33.91 O2 Sat:      93 % on Room air Temp:     97.9 degrees F oral Pulse rate:   60 / minute Resp:     18 per minute BP sitting:   126 / 70  (right arm) Cuff size:   large  Vitals Entered By: [name] CMA (May 29, 2010 7:59 AM)  O2 Flow:  Room air CC: 6 Month follow up  Is Patient Diabetic? Yes Did you bring your meter with you today? No Pain Assessment Patient in pain? no      Comments no refills needed, f/u with cardiology on Monday, not checking blood sugars, fasting for blood work   Primary Care Provider:  D. Thomos Lemons DO  CC:  6 Month follow up .  History of Present Illness: 77 y/o white male for f/u  pt reports falling 10 days ago at home he slipped over bedroom slippers.   he landed end of sofa hitting his right upper ribs he has large resolving bruise he was seen at urgent care the next day - cxr negative for rib fracture he also skinned his right knee. 2-3 cm scab.  he has kept clean  DM II - wt loss.  eating more oatmeal vs biscuits and bacon for breakfast  A Fib - no change.    Preventive Screening-Counseling & Management  Alcohol-Tobacco     Smoking Status: quit  Allergies (verified): No Known Drug Allergies  Past History:  Past Medical History: Current Problems:  CORONARY ARTERY DISEASE (ICD-414.00) CONGESTIVE HEART FAILURE (ICD-428.0) FIBRILLATION, ATRIAL (ICD-427.31)   HYPERTENSION (ICD-401.9) HYPERLIPIDEMIA (ICD-272.2)  LEG CRAMPS (ICD-729.82) BRONCHITIS (ICD-490) SHINGLES (ICD-053.9) RENAL INSUFFICIENCY, CHRONIC (ICD-585.9) OSTEOARTHRITIS (ICD-715.90) GOUT (ICD-274.9) GERD (ICD-530.81) DIABETES MELLITUS, TYPE II (ICD-250.00)  Past Surgical History: S/P Mitral valve repair- 04/2005 S/P Cardioversion-11/02/2005  Coronary artery bypass graft-05/18/2005         Family History: Mother  deceased at age 61, secondary to motor vehicle accident.  Father deceased at age 44 with a cerebral hemorrhage.  Patient has 2 brothers and 2 sisters.  There is diabetes in his siblings.        Social History: Married but separated from wife Alcohol use-yes  Former Smoker quit 40 yrs ago (40 pack yr history)   Works part time at [employer] near [employer]    Review of Systems       denies chest pain, cough or dyspnea no orthopnea  Physical Exam  General:  alert, well-developed, and well-nourished.   Head:  normocephalic and atraumatic.   Mouth:  pharynx pink and moist.   Neck:  supple and no masses.   Chest Wall:  large soft hematoma / bruise right upper chest Lungs:  normal respiratory effort, normal breath sounds, no crackles, and no wheezes.   Heart:  normal rate, regular rhythm, and no gallop.   Extremities:  trace left pedal edema and 1+ right pedal edema.   Neurologic:  cranial nerves II-XII intact and gait normal.   Skin:  right knee healing abrasion,  surrounding erythema,  non tender,     Impression & Recommendations:  Problem # 1:  HEMATOMA (ICD-924.9) pt suffered fall 10 days ago tripped over bedroom slippers and landed on right side landed  on end of couch he has fairly large bruise on right upper ribs no tenderness  pt reports eval at urgent care.  neg x ray for rib fracture he notes hematoma getting smaller repeat INR - try to keep low end of normal use warm compress  Problem # 2:  HYPOXEMIA (ICD-799.02) question related to fall consider amiodarone toxicity stop amiodarone  Orders: CXR- 2view (CXR)  Problem # 3:  CELLULITIS (ICD-682.9)  mild cellulitis of right knee use keflex as directed continue local care keep abrasion covered  His updated medication list for this problem includes:    Cephalexin 500 Mg Caps (Cephalexin) ..... One by mouth three times a day  Problem # 4:  HYPERTENSION (ICD-401.9)  His updated medication list  for this problem includes:    Amlodipine Besylate 10 Mg Tabs (Amlodipine besylate) .Marland Kitchen... 1 tablet by mouth every morning    Coreg 6.25 Mg Tabs (Carvedilol) ..... One by mouth bid    Lisinopril-hydrochlorothiazide 20-25 Mg Tabs (Lisinopril-hydrochlorothiazide) ..... One by mouth once daily  BP today: 126/70 Prior BP: 118/70 (02/13/2010)  Labs Reviewed: K+: 4.8 (02/13/2010) Creat: : 1.70 (02/13/2010)   Chol: 118 (08/20/2008)   HDL: 34.20 (08/20/2008)   LDL: 54 (08/20/2008)   TG: 147.0 (08/20/2008)  Complete Medication List: 1)  Amlodipine Besylate 10 Mg Tabs (Amlodipine besylate) .Marland Kitchen.. 1 tablet by mouth every morning 2)  Coreg 6.25 Mg Tabs (Carvedilol) .... One by mouth bid 3)  Folic Acid 1 Mg Tabs (Folic acid) .... Take 1 tablet by mouth once a day 4)  Lisinopril-hydrochlorothiazide 20-25 Mg Tabs (Lisinopril-hydrochlorothiazide) .... One by mouth once daily 5)  Lovastatin 40 Mg Tabs (Lovastatin) .... Take 2  tab by mouth at bedtime 6)  Colchicine 0.6 Mg Tabs (Colchicine) .... One by mouth once daily prn 7)  Coumadin 4 Mg Tabs (Warfarin sodium) .... Take as directed by coumadin clinic (up to 1 1/2 tablets daily) 8)  Cephalexin 500 Mg Caps (Cephalexin) .... One by mouth three times a day  Other Orders: T-CBC No Diff (16109-60454) T-Protime, Auto [phone]) T- Hemoglobin A1C (29562-13086) T-BNP  (B Natriuretic Peptide) (57846-96295) T-TSH (28413-24401) T-T4, Free (02725-36644)  Patient Instructions: 1)  Hold coumadin x 24 hrs 2)  Please schedule a follow-up appointment in 2 months. Prescriptions: CEPHALEXIN 500 MG CAPS (CEPHALEXIN) one by mouth three times a day  #21 x 0   Entered and Authorized by:   D. Thomos Lemons DO   Signed by:   D. Thomos Lemons DO on 05/29/2010   Method used:   Electronically to        [facility] (retail)       [address]       [place], [place]  [address]       Ph: [phone]       Fax: [phone]   RxID:    [id number]    Orders Added: 1)  CXR- 2view [CXR] 2)  T-CBC No Diff [85027-10000] 3)  T-Protime, Auto [55732-20254] 4)  T- Hemoglobin A1C [83036-23375] 5)  T-BNP  (B Natriuretic Peptide) [83880-55185] 6)  T-TSH [27062-37628] 7)  T-T4, Free [31517-61607] 8)  Est. Patient Level IV [37106]   Immunization History:  Zostavax History:    Zostavax # 1:  zostavax (08/26/2009)   Immunization History:  Zostavax History:    Zostavax # 1:  Zostavax (08/26/2009)  Current Allergies (reviewed today): No known allergies

## 2010-06-30 DIAGNOSIS — I059 Rheumatic mitral valve disease, unspecified: Secondary | ICD-10-CM | POA: Insufficient documentation

## 2010-07-06 LAB — CBC
HCT: 40.6 % (ref 39.0–52.0)
Hemoglobin: 13.8 g/dL (ref 13.0–17.0)
MCHC: 34.1 g/dL (ref 30.0–36.0)
RDW: 14.9 % (ref 11.5–15.5)

## 2010-07-06 LAB — BASIC METABOLIC PANEL
BUN: 22 mg/dL (ref 6–23)
CO2: 28 mEq/L (ref 19–32)
Calcium: 9.6 mg/dL (ref 8.4–10.5)
Glucose, Bld: 133 mg/dL — ABNORMAL HIGH (ref 70–99)
Sodium: 139 mEq/L (ref 135–145)

## 2010-07-06 LAB — PROTIME-INR: INR: 3.12 — ABNORMAL HIGH (ref 0.00–1.49)

## 2010-08-08 ENCOUNTER — Telehealth: Payer: Self-pay | Admitting: Internal Medicine

## 2010-08-08 MED ORDER — AMLODIPINE BESYLATE 10 MG PO TABS: 10.0000 mg | ORAL_TABLET | Freq: Every day | ORAL | Status: DC

## 2010-08-08 MED ORDER — FOLIC ACID 1 MG PO TABS: 1.0000 mg | ORAL_TABLET | Freq: Every day | ORAL | Status: DC

## 2010-08-08 NOTE — Telephone Encounter (Signed)
Refill-amlodipine besylate 10mg  tab. Take 1 tablet by mouth every morning. Qty 30. Last fill 3.27.12

## 2010-08-08 NOTE — Telephone Encounter (Signed)
Refill- folic acid 1mg  tab. Take 1 tablet by mouth once daily. Qty 32. Last fill 3.27.12

## 2010-09-05 NOTE — Assessment & Plan Note (Signed)
[facility]                              CARDIOLOGY OFFICE NOTE   Javier [name], Javier [name]                       MRN:          [id number]  DATE:01/28/2006                            DOB:          1930/02/27    He has a history of mitral valve repair with single vessel bypass in  October, 2006.  He has had PAF.  He has hypertension, type 2 diabetes.   Unfortunately, the patient has not been very compliant.  He is not divorced,  but he and his wife live in separate houses.  He actually walks over to her  house to get his medications.  He really does note know what he is on.  We  have sent the patient multiple certified letters regarding getting his  Coumadin checked, but he has not had it checked since July.   He was cardioverted in July, 2007 and subsequently underwent bypass surgery.  He is in sinus rhythm today by EKG.  I had a long discussion with the son  and the patient.  I do not trust the fact that he is on Coumadin without  getting it checked.  Since he is in sinus rhythm, and his mitral valve was  repaired, I think we will stop his Coumadin and just have him take an  aspirin a day.  He knows to contact me should his pulse be high and  irregular again.  From a cardiac perspective, he is otherwise doing well.  He has not had any significant PND, orthopnea, syncope, lower extremity  edema, or chest pain.   MEDICATIONS:  1. Warfarin 2.5 a day, which we are stopping.  2. Folic acid 1 mg a day.  3. Lovastatin 80 a day.  4. Lopressor 25 b.i.d.  5. Aspirin a day.  6. Amlodipine 5 a day.  7. Lisinopril/hydrochlorothiazide 20/12.5 once daily.   PHYSICAL EXAMINATION:  GENERAL:  On exam, he looks well.  VITAL SIGNS:  Blood pressure is 150/80, pulse 60 and regular.  LUNGS:  Clear.  NECK:  Carotids are normal.  HEART:  There is an S1 and S2.  Normal heart sounds.  ABDOMEN:  Benign.  EXTREMITIES:  Lower extremities have intact pulses.  No edema.   EKG shows sinus rhythm with nonspecific ST-T wave changes laterally.  Q-T  interval was 480.   IMPRESSION:  1. Stable previous atrial fibrillation with cardioversion, maintaining      sinus rhythm.  Stop Coumadin.  2. History of single vessel bypass.  Continue aspirin therapy and      Lovastatin.  3. Status post mitral valve repair.  Follow up echo in six months.      Continue lisinopril/hydrochlorothiazide for blood pressure control and      afterload reduction.  4. Overall, I am pleased with his progress and I really do think that      since he is not compliant with following his PT's, his Coumadin should      be stopped.            ______________________________  Noralyn Pick Eden Emms, MD, [facility]  PCN/MedQ  DD:  01/28/2006  DT:  01/29/2006  Job #:  [id number]

## 2010-09-05 NOTE — Cardiovascular Report (Signed)
NAME:  Javier [name], Javier [name] NO.:  [id number]   MEDICAL RECORD NO.:  [id number]          PATIENT TYPE:  INP   LOCATION:  2927                         FACILITY:  [facility]   PHYSICIAN:  Arturo Morton. Riley Kill, M.D. [facility] OF BIRTH:  [birth year]   DATE OF PROCEDURE:  01/27/2005  DATE OF DISCHARGE:                              CARDIAC CATHETERIZATION   INDICATIONS:  Mr. Savage is a 77 year old with diabetes and smoking history.  He presents with chest pain with hypertension.  He has also had problem with  noncompliance.  He presented with heart failure.  The current study was done  to assess coronary anatomy.  Importantly, the patient also has mitral  regurgitation which was assessed by Dr. Eden Emms and felt to be severe.   PROCEDURES:  1.  Right and left heart catheterization.  2.  Selective coronary arteriography.  3.  Selective left ventriculography biplane.   DESCRIPTION OF PROCEDURE:  The patient was brought to the catheterization  laboratory and prepped and draped in usual fashion.  Through an anterior  puncture, femoral vein was entered.  A 7-French sheath was placed.  A 7.5-  French thermodilution Swan-Ganz catheter was then taken to the superior vena  cava where saturation was obtained.  Serial right heart pressures were then  obtained a pulmonary artery saturation obtained.  Thermodilution cardiac  outputs were then performed.  Following this, the preparations were made for  right femoral artery entry.  Through an anterior puncture, the right femoral  artery was easily entered and a 6-French sheath was placed.  Central aortic  and left ventricular pressures were measured with a pigtail. Simultaneous  wedge LV were then obtained.  Following this, a right heart pullback was  done.  Ventriculography was then performed both in the RAO and LAO  projections.  Following this, the pigtail was pulled into the central aorta  were a hand injection was done to exclude significant  aortic regurgitation.  The pigtail was then removed.  We then did coronary arteriography in  multiple angiographic projections with both left and right coronary  arteries.  He was taken to the holding area then in satisfactory clinical  condition.   HEMODYNAMIC DATA:  1.  Right atrium 5.  2.  RV 47/5.  3.  Pulmonary artery 43/17, mean 27.  4.  Pulmonary capillary wedge 12.  5.  Repeat wedge 11.  6.  Aortic 114/61, mean 84.  7.  LV 110/6.  8.  Fick cardiac output 5.0 L/min.  9.  Fick cardiac index 2.2 L/min/m2.  10. Thermodilution cardiac output 3.6 L/min.  11. Thermodilution cardiac index 1.7 L/min/m2.  12. Superior vena cava saturation 61%.  13. Pulmonary artery saturation 63%.  14. Aortic saturation 86%.   ANGIOGRAPHIC DATA:  1.  Ventriculography was performed in both the RAO and LAO projections.      With ventricular ectopy, it was difficult to assess mitral      regurgitation.  However, in both views it did not appear to be severe in      either view.  Angiographically, it would be graded  no more than about      2+.  There appeared to be one small prolapsing leaflet.  Overall      systolic function appears preserved but because of the ectopy a definite      wall motion abnormality was difficult to assess.  2.  The hand injection into the aortic root did not suggest significant      aortic regurgitation.  3.  The left main is free of critical disease.  4.  The LAD demonstrates a tapered narrowing that measures about 50% and      then 70% at the bifurcation of the second diagonal.  It also involves      the second diagonal bifurcation.  The vessel then terminates distally at      a large-caliber vessel that wraps the apex.  The first diagonal was      without critical narrowing.  This was done both before and after      intracoronary nitroglycerin administration.  5.  The circumflex is a large-caliber vessel that provides predominantly a      single marginal branch which  bifurcates distally and is free of critical      disease.  6.  The right coronary artery demonstrates a large-caliber vessel with about      30-40% narrowing near the acute margin.  There is a PDA and      posterolateral system without critical narrowing.   CONCLUSION:  1.  Probable mitral regurgitation of uncertain severity.  2.  Mild/moderate pulmonary artery hypertension.  3.  No step-up between the superior vena cava and pulmonary artery.  4.  Moderately high-grade stenosis of the left anterior descending artery at      the takeoff of the second diagonal.   I think we need to resolve the issue with TEE to assess the mitral  regurgitation.  This will critically impact outcome and approach.  I will  discuss this with Dr. Eden Emms.      Arturo Morton. Riley Kill, M.D. [facility]  Electronically Signed     TDS/MEDQ  D:  01/27/2005  T:  01/27/2005  Job:  [id number]   cc:   Arturo Morton. Riley Kill, M.D. [facility]  [address]  [address]  [place]  [place] [address]   patient's medical record   Charlton Haws, M.D.  [phone] [address]  [address]  [place]  [place] [address]   Melissa L. Ladona Ridgel, MD

## 2010-09-05 NOTE — Op Note (Signed)
NAME:  ZYMARION, FAVORITE NO.:  [id number]   MEDICAL RECORD NO.:  [id number]          PATIENT TYPE:  OIB   LOCATION:  2899                         FACILITY:  [facility]   PHYSICIAN:  Charlton Haws, M.D.     DATE OF BIRTH:  1930/03/19   DATE OF PROCEDURE:  DATE OF DISCHARGE:                                 OPERATIVE REPORT   PROCEDURE:  Direct current cardioversion.   Mr. Hendershott is a 77 year old patient of myself.  He is status post single-  vessel cavity and mitral valve repair for pulmonary edema and severe MR in  January.  He had postop atrial fibrillation.  He has been therapeutic on his  Coumadin for 4 weeks.  His INR today was 2.8.   Interestingly, his creatinine has bumped up to 2.9.  He is a diabetic.   The patient was given 175 mg sodium pentothal.  A single 200 joules biphasic  shock was delivered.  He converted from atrial flutter at a rate of 100 to  sinus bradycardia at a rate of 46-49.   IMPRESSION:  Successful direct current cardioversion.   The patient will stop his Lasix, Pacerone and potassium.  He will come back  on his Lopressor from 50 b.i.d. to 25 mg once a day.  I will see him back in  the office next week to recheck his creatinine and protime.   Hopefully with stopping of his diuretics his creatinine will come back down  to baseline.   We will have to watch him closely in regards to his bradycardia, but I think  cutting back on his beta blocker and stopping his pacer will bring him up  nicely into the 60 range for heart rate.   The patient tolerated procedure well.           ______________________________  Charlton Haws, M.D.     PN/MEDQ  D:  11/03/2005  T:  11/03/2005  Job:  [id number]

## 2010-09-05 NOTE — Discharge Summary (Signed)
NAME:  Javier [name], Javier [name] NO.:  [id number]   MEDICAL RECORD NO.:  [id number]          PATIENT TYPE:  INP   LOCATION:  2007                         FACILITY:  [facility]   PHYSICIAN:  Deirdre Peer. Polite, M.D. DATE OF BIRTH:  [birth year]   DATE OF ADMISSION:  01/22/2005  DATE OF DISCHARGE:                                 DISCHARGE SUMMARY   DISCHARGE DIAGNOSES:  1.  Chest pain, status post cardiac catheterization.  Ejection fraction 50%,      1-2+ mitral regurgitation, 70% mid-left anterior descending lesion.  2.  Status post transesophageal echocardiogram.  This showed mild-to-      moderate global reduction in left ventricular function, ejection      fraction 40-45%, moderate left atrial enlargement, normal right atrium      and right ventricle, no significant pericardial effusion,      atherosclerosis of the descending aorta, mildly thickened mitral valve,      moderate-to-severe mitral regurgitation, __________ posterolaterally.  3.  Hypertension.  4.  Diabetes.  A1C 6.7.  5.  History of medical noncompliance secondary to inability to afford      medications.   DISCHARGE MEDICATIONS:  1.  Glucotrol XL 5 mg daily.  2.  Metoprolol 50 mg b.i.d.  3.  Lasix 40 mg b.i.d.  4.  Lisinopril 10 mg daily.  5.  K-Dur 20 mEq b.i.d.  6.  Zocor 40 mg daily.   DISPOSITION:  The patient is discharged to home in stable condition.   FOLLOW UP:  The patient is asked to follow up with [facility] and  primary M.D studies.   CONSULTATIONS:  [facility].   STUDIES:  1.  Cardiac catheterization which revealed moderate high-grade stenosis of      the LAD at the takeoff of the second diagonal.  2.  TEE, as stated above.  3.  2-D echocardiogram which revealed an EF of 50-55%, positive MR,      moderate.  Recommend TE to further evaluate.   LABORATORY DATA:  Cardiac enzymes revealed a CK of 164, MB 5.3, index 3.2,  troponin I of 0.1, follow up troponin I 0.08, BNP 479.   Total cholesterol  136, HDL 35, LDL 86.  TSH 2.3.  UA negative.  Blood cultures negative.  Chest x-ray:  Cardiomegaly with small effusions, right basilar opacity  consistent with atelectasis and effusion.   HISTORY OF PRESENT ILLNESS:  The patient is a 77 year old gentleman who  presented to the ED with complaints of chest pain and shortness of breath.  In the ED, the patient was evaluated and found to have accelerated  hypertension.  Admission to the hospital was deemed necessary for further  evaluation and treatment.  Please see the dictated H&P for further details.   PAST MEDICAL HISTORY:  Per admission H&P.   FAMILY HISTORY:  Positive for heart disease.   SOCIAL HISTORY:  Per admission H&P.  Reportedly, the patient does smoke and  drinks alcohol on a social basis.   HOSPITAL COURSE:  The patient was admitted to a telemetry floor bed for  evaluation  and treatment of chest pain and accelerated hypertension.  The  patient was seen in consultation by [facility].  The patient had  multiple studies, as stated in the data section.  It was ultimately thought  that the patient should undergo heart catheterization.  The patient  underwent heart catheterization.  The results are as stated in the data  section.  Because of significant MR, it was recommended that the patient  have a TEE, the results of which are stated in the data section.  The  patient had other screening tests which included a hemoglobin A1C which was  6.7 and a lipid panel.  The patient's medications were adjusted  appropriately.  On further review, it was felt that the patient had gone  without his medications secondary to inability to afford his medications.  A  case manager was consulted.  The patient will be given medications for  approximately three days and plans to follow up in  for further  medications.   At the time of this dictation, the patient is without chest pain and has  been cleared by  cardiology for discharge to home.  The patient is requested  to take his medications as outlined.  The biggest problem for this patient  is probably inability to afford his medications, and as stated, a case  manager has been consulted to assist in this matter.  At this time, the  patient is medically stable for discharge.      Deirdre Peer. Polite, M.D.  Electronically Signed     RDP/MEDQ  D:  01/29/2005  T:  01/30/2005  Job:  [id number]

## 2010-09-05 NOTE — Consult Note (Signed)
NAME:  Javier [name], ITALIANO NO.:  [id number]   MEDICAL RECORD NO.:  [id number]          PATIENT TYPE:  INP   LOCATION:  1843                         FACILITY:  [facility]   PHYSICIAN:  Charlton Haws, M.D.     DATE OF BIRTH:  10-Jan-1930   DATE OF CONSULTATION:  01/23/2005  DATE OF DISCHARGE:                                   CONSULTATION   Mr. Javier [name] is a 77 year old patient who usually does not seek medical care.  He has been seen by Dr. Viann Fish in the past for chest pain and mildly  elevated troponins.  Dr. Donnie Aho felt that an inpatient work-up in 2001 was  necessary.  I do not know if he followed up with a My[facility].  Patient has  hypertension, diabetes, and, as indicated, is noncompliant, takes no  medicines.  In short, he takes very poor care of himself.  He is a distant  smoker.  Clinically, on examination he was admitted with increasing PND,  orthopnea, and shortness of breath.   The patient's initial CPKs were negative.  His troponins are 0.1 and 0.08.   He had a BNP of 479.   In talking to the patient he has had gradual worsening of his shortness of  breath.  This is actually more of a problem than any chest pain.   The patient does not have a history of known coronary artery disease.   He has also noted some increasing abdominal bloating.   Despite having history of hypertension and diabetes, he is not taking any  medications.  In the past he has been on Lopressor and Glucotrol.   His family history is positive for heart disease on his father's side.  I  believe his mother died of a motor vehicle accident.  He does drink one to  two beers a day.  He quit smoking 40 years ago.   PAST MEDICAL HISTORY:  Remarkable for previous knee problems and testicular  torsion.   He has no known drug allergies.   REVIEW OF SYSTEMS:  Remarkable for no evidence of previous PE, pulmonary  hypertension, or DVT.  Clinically, he has a history of snoring.  He is  married  but he and his wife do not live together.  Part of the reason is his  snoring.   Therefore, clinically he has a diagnosis of obstructive sleep apnea.   PHYSICAL EXAMINATION:  GENERAL:  He is significantly overweight.  VITAL SIGNS:  Blood pressure 180/90 on IV nitroglycerin.  He has 96%  saturations on 2 L.  LUNGS:  Inspiratory crackles.  HEART:  There is an S1, S2 with distant heart sounds and occasional PVCs.  ABDOMEN:  Belly is protuberant.  There may be ascites.  EXTREMITIES:  Distal pulses are intact.  He has +1-2 lower extremity edema  bilaterally.   His EKG shows sinus rhythm with LVH and no acute changes.   His BNP was 479.  Creatinine is 1.2.  Point of care enzymes, CPKs are  negative.   Chest x-ray shows COPD with mild left lower lobe scarring and  atelectasis.   IMPRESSION:  The patient is clearly volume overloaded.  Clinically he has  evidence for chronic obstructive pulmonary disease and sleep apnea.   He needs aggressive therapy for his blood pressure.  Intravenous  nitroglycerin will be continued.  He will be started on an ACE inhibitor  since he is a diabetic as well.   Subcutaneous Lovenox will be given to prevent deep venous thrombosis.   His primary care doctors will get him on a regimen to control his diabetes.   2-D echocardiogram will initially be done to assess his left ventricular  function.   If he has decreased left ventricular function he may very well need a heart  catheterization before discharge to assess pressures and rule out coronary  disease.   I had a long discussion with the patient and explained to him he really  needs regular medical follow-up given his hypertension and diabetes.   His wife indicated that he would probably come back to stay with her.   I think initially he will need at least a b.i.d. Lasix regimen.  We will  obtain him on intravenous nitroglycerin until his systolic pressures come  down into the 150 range.   I do not  think he has an acute ischemic coronary event.   Will try to get his 2-D echocardiogram done today.  Any heart  catheterization will have to await improved clinical status as he has  difficulty laying flat now.  I suspect PFTs and a pulmonary consult will  also be needed.           ______________________________  Charlton Haws, M.D.     PN/MEDQ  D:  01/23/2005  T:  01/23/2005  Job:  [id number]

## 2010-09-05 NOTE — Cardiovascular Report (Signed)
NAME:  SAQIB, CAZAREZ NO.:  [id number]   MEDICAL RECORD NO.:  [id number]          PATIENT TYPE:  INP   LOCATION:  2314                         FACILITY:  [facility]   PHYSICIAN:  Zenon Mayo, MDDATE OF BIRTH:  1929-11-27   DATE OF PROCEDURE:  05/18/2005  DATE OF DISCHARGE:                              CARDIAC CATHETERIZATION   DESCRIPTION:  Mr. Boakye is a 77 year old gentleman, with a history of  coronary artery disease and mitral valvular regurgitation, who was brought  to the operating room today by Dr. Laneta Simmers for coronary artery bypass surgery  as well as repair versus replacement of the mitral valve. The patient was  brought to the operating room and placed under general anesthesia. After  confirmation of endotracheal tube placement and orogastric suctioning, a  transesophageal echocardiogram probe was placed in the patient's esophagus  without complication. The left ventricle was imaged first and revealed  severe concentric hypertrophy of the left ventricle. There were no wall  motion abnormalities; however, there was mild reduction in left ventricular  function with an ejection fraction estimated to be 50%. The mitral valve was  imaged next. The mitral valve appeared slightly thickened with mild amount  of calcification predominantly located on the anterior leaflet. The leaflets  appeared to move freely and appeared to coapt well. There did not appear to  be any billowing, prolapse or restriction of movement of either of the  leaflets. When color was placed across the valve, however, a large  regurgitant jet moved over the posterior leaflet and up the posterior wall  of the left atrium. The regurgitant jet was moderate to severe in nature. It  did not appear to compromise or show any reversal of flow into the pulmonary  veins. It does seem to affect the left atrial size, however, with a mild  dilation of the left atrium measuring 45 mm. The aortic valve  was imaged  next. The aortic valve was trileaflet in nature with minimal calcification  noted. No vegetations seen. The aortic valve area was 2.38 cm2. There was no  regurgitant jet seen when evaluated with color. The pulmonic valve was  difficult to see, however, there appeared to be no pulmonic regurgitation.  Tricuspid valve appeared normal in structure, however, when color was placed  across the valve, it to had trace amounts of tricuspid regurgitation. The  interatrial septum was evaluated both with color as well as bubble test and  there was no evidence of AFB with either of those tests. The thoracic aorta  revealed minimal atherosclerotic disease throughout.   At the conclusion of bypass, the heart was imaged again. A ring had been  placed in the mitral position and appeared to be seated well. There did  continue to be some mitral regurgitation, however, I would classify this  regurgitant jet as trace at this point. All other structures and functions  of the heart remained unchanged from prebypass values. The aortic cannula  was removed without any evidence of aortic dissection. The patient was  placed on a dopamine infusion initially to help wean from bypass,  but this  was later turned off as his vital signs and cardiac output  improved. At the conclusion of the procedure, the transesophageal  echocardiogram probe was removed from the patient's esophagus without any  complication or evidence of trauma. The patient was taken directly from the  operating room to the intensive care unit in stable condition.           ______________________________  Zenon Mayo, MD     WEF/MEDQ  D:  05/18/2005  T:  05/19/2005  Job:  [id number]   cc:   Patient's chart   Anesthesia office

## 2010-09-05 NOTE — H&P (Signed)
NAME:  Javier [name], Javier [name] NO.:  [id number]   MEDICAL RECORD NO.:  [id number]          PATIENT TYPE:  INP   LOCATION:  1843                         FACILITY:  [facility]   PHYSICIAN:  Jackie Plum, M.D.DATE OF BIRTH:  1930/01/31   DATE OF ADMISSION:  01/22/2005  DATE OF DISCHARGE:                                HISTORY & PHYSICAL   CHIEF COMPLAINT:  Chest pain and shortness of breath.   The patient presents with a five-day history of worsening chest pain like  someone is sitting on his chest wall, associated with some difficulty  breathing. The patient indicates that his shortness of breath is more of a  problem than his chest pain and his shortness of breath is also worsened by  lying flat. The chest pain is not radiating and not associated with any  nausea or vomiting. He denies any history of fever, chills, cough, sputum  production, leg or calf pain. He has had episodes of fluttering heart and  he has noted that his ankles have been swollen some. He denies any visual  changes, transient episodes, headaches, excessive urination, or dysuria.  Denies any abdominal pain, diarrhea, constipation, black or bloody stools.   The patient presented to the ED where upon he was started on nitroglycerin  drip with improvement of his pain. The patient indicates that the pain was  moderate in intensity but could not measure it on a scale of 1 to 10.   PAST MEDICAL HISTORY:  The patient has a history of hypertension and  diabetes for several years. He used to be on metoprolol 25 mg q.12h.,  Glucotrol XL 5 mg daily, as well as aspirin 325 mg daily in August 2001 . He  has not been taking any medication because he indicates that he can not  afford this medicine. The patient does not have any medication allergies and  he is not taking any regular medications at the moment.   FAMILY HISTORY:  Positive for heart disease.   SOCIAL HISTORY:  The patient lives in his own home. Does  smoke cigarettes.  He drinks alcohol on a social basis.   REVIEW OF SYSTEMS:  Significant positive/negatives as noted in the HPI.  Otherwise unremarkable.   PHYSICAL EXAMINATION:  VITAL SIGNS:  The patient on presentation had a blood  pressure of 226/127. It had gone down to 183/107. Respiratory rate of 24 and  pulse oximetry of 96% on two liters of oxygen by nasal cannula.  GENERAL:  He was a moderately obese gentleman lying stretcher not in acute  cardiopulmonary distress.  HEENT:  Normocephalic and atraumatic. Pupils are equal, round, and reactive  to light. Extraocular movements intact. Oropharynx was moist.  NECK:  Supple. No JVD.  LUNGS:  The patient had diminished breath sounds at the bases with fine  bibasilar rales.  CARDIOVASCULAR:  The patient did not have tachycardia. He had a regular  rhythm with occasional asystole. No murmurs or gallops were appreciated.  ABDOMEN:  Very obese, soft. Bowel sounds presents.  EXTREMITIES:  The patient had trace to 1 pedal pitting edema.  There was no  cyanosis.  NEUROLOGICAL:  The patient was alert and oriented x3. No acute focal  deficits noted.   LABORATORY DATA:  X-ray was not done. A 12-lead EKG showed sinus rhythm at  84 beats per minute with left ventricular hypertrophy without any acute ST  or T-wave changes. Blood work showed pH 7.38, pCO2 of 49, bicarbonate 29.  Hemoglobin 15, hematocrit 44. Sodium 142, potassium 4.1, chloride 108,  glucose 193, BUN 14, creatinine 1.2. Point of care cardiac markers were  negative x1. A BNP was 479.4.   IMPRESSION:  1.  Chest pain .  2.  Accelerated Hypertension.  3.  History of diabetes, hypertension, and noncompliance.   PLAN:  The patient will be admitted to a stepdown bed. We will continue him  on nitroglycerin drip and titrate to keep for pain control and also keep the  systolic BP under 150. The patient was started on beta blocker, ACE  inhibitor, and sliding scale insulin. We will  check cardiac enzymes  serially. A 12-lead EKG will be repeated in the morning. We will check a 2-D  echocardiogram, TSH, as well as portable x-ray with a hemoglobin A1c. The  patient was started on Lasix presently in view of pulmonary findings with  lower extremity swelling and elevated BNP. We will check fasting lipid panel  as well as urine albumin in the morning. The patient will need social  work/case management follow-up. Try to get him on shared PCP in the  community as well as possible medication assistance if he qualifies.      Jackie Plum, M.D.  Electronically Signed     GO/MEDQ  D:  01/23/2005  T:  01/23/2005  Job:  [id number]

## 2010-09-05 NOTE — Op Note (Signed)
NAME:  Javier [name], Javier [name] NO.:  [id number]   MEDICAL RECORD NO.:  [id number]          PATIENT TYPE:  INP   LOCATION:  2314                         FACILITY:  [facility]   PHYSICIAN:  Evelene Croon, M.D.     DATE OF BIRTH:  05/16/1929   DATE OF PROCEDURE:  DATE OF DISCHARGE:  05/18/2005                                 OPERATIVE REPORT   PREOPERATIVE DIAGNOSES:  1.  Single-vessel coronary disease.  2.  Moderate to severe mitral regurgitation.   POSTOPERATIVE DIAGNOSES:  1.  Single-vessel coronary disease.  2.  Moderate to severe mitral regurgitation.   OPERATIVE PROCEDURE:  1.  Median sternotomy, extracorporeal circulation, coronary bypass graft      surgery x2 using a saphenous vein graft to the second diagonal branch of      the LAD and a left internal mammary artery graft in the left anterior      descending coronary artery.  2.  Mitral valve repair with insertion of new Gore-Tex chordae to the      anterior leaflet and insertion of a 28 mm annuloplasty ring, oversewing      of the left atrial appendage.  3.  Endoscopic vein harvesting from the right leg.   ATTENDING SURGEON:  Evelene Croon, M.D.   ASSISTANT:  Salvatore Decent. Cornelius Moras, M.D.   SECOND ASSISTANT:  Pecola Leisure, P.A-C.   THIRD ASSISTANT:  Constance Holster, P.A.-C.   ANESTHESIA:  General endotracheal.   CLINICAL HISTORY:  This patient is a 77 year old gentleman with a history of  obesity and diabetes who was admitted in October 2006 with congestive heart  failure.  A cardiac catheterization showed significant single-vessel  coronary disease with about 70-80% bifurcation stenosis at the takeoff of a  moderate size second diagonal branch.  The left circumflex had no  significant disease.  The right coronary had insignificant proximal to mid  narrowing of about 20-30%. This was a large vessel.  The left ventricle was  somewhat dilated and hypokinetic with an ejection fraction of about 40%.  There was  moderate mitral regurgitation.  Transthoracic and transesophageal  echocardiogram showed moderate to severe mitral regurgitation with a  posteriorly directed jet that held the posterior wall of the left atrium and  extending around to the back of the left atrium.  It appeared that there  would be a small amount of prolapse of the anterior leaflet.  There was no  significant aortic insufficiency or aortic stenosis.  After review of the  angiogram and  echocardiogram, it was felt that coronary artery bypass graft  surgery and mitral valve repair were the best treatment to prevent further  ischemia, infarction and congestive heart failure.  I discussed operative  procedure with the patient including alternatives, benefits and risks  including bleeding, blood transfusion, infection, stroke, myocardial  infarction, graft failure, and death.  I discussed the mitral valve repair  surgery and the possibility that he may require mitral valve replacement and  may develop recurrent mitral regurgitation in the future.  He understood and  agreed to proceed.  I saw the  patient in November 2006. But he wanted to  wait after the holidays to have surgery performed.  He has done well in the  meantime.   OPERATIVE PROCEDURE:  The patient was taken to the operating room and placed  on the table in the supine position.  After induction of general  endotracheal anesthesia, a Foley catheter was placed in the bladder using  sterile technique.  Preoperative intravenous antibiotics were given. Then  the chest, abdomen and both lower extremities were prepped and draped in the  usual sterile manner.  Transesophageal echocardiogram was performed by  anesthesiology. This showed moderate to severe mitral regurgitation with a  posteriorly directed jet that hugged the wall of the left atrium around to  the back side of the left atrium.  time of the level left atrium around to  the back side of left atrium.  There does not  appear to be any visible  prolapse of the mitral valve leaflets.  Left ventricular function appeared  unchanged with ejection fraction about 40-45%.   Then the chest was opened through a median sternotomy incision and the  pericardium opened midline.  Examination of the heart showed good  ventricular contractility.  The ascending aorta had no palpable plaques in  it.   Then the left internal mammary artery was flushed from the chest wall as  pedicle graft. This was a medium caliber vessel with excellent blood flow  through it.  At the same time, a segment of greater saphenous vein was  harvested from the right leg using endoscopic vein harvest technique.  This  vein was of medium size and quality. .   Then the patient was heparinized and when an adequate activated clotting  time was achieved, the distal ascending aorta was cannulated using a 20-  [place] aortic cannula for arterial inflow.  Venous outflow was achieved  using bicaval venous cannulation with a 28-French metal tip right angle  cannula placed directly into the superior vena cava and a 36-French plastic  right angle cannula placed through a pursestring suture in the low right  atrium.  An antegrade cardioplegia and vent cannula was inserted in the  aortic root. A retrograde cardioplegic cannula was inserted through the  right atrium into the coronary sinus.   The patient was placed on cardiopulmonary bypass and the distal coronaries  identified. The diagonal branch was a small to medium size vessel that was  graftable. The LAD was a large graftable vessel. There was a large amount of  epicardial fat.   Then the aorta was cross clamped and 500 mL of cold blood antegrade  cardioplegia was administered in the aortic root with quick arrest the  heart.  Systemic hypothermia to 20 degrees centigrade and topical  hypothermic iced saline was used.  A temperature probe was placed in septum and insulating pad in the pericardium.   Additional doses of antegrade or  retrograde cardioplegia were given at about 20-minute intervals to maintain  myocardial temperature around 10 degrees Centigrade.   Then the first distal anastomosis was performed to the second diagonal  branch.  The internal diameter was about 1.6 mm.  The conduit used was a  segment of greater saphenous vein graft.  The anastomosis was performed in  an end-to-side manner using continuous 7-0 Prolene suture.  Flow was  measured through the graft and was excellent.  table vessel.   A second distal anastomosis was formed the midportion of left anterior  descending coronary.  The  internal diameter of this vessel was about 2 mm.  The conduit used was the left internal mammary graft and this was brought  through an opening in the left pericardium anterior to the phrenic nerve.  It was anastomosed to the LAD in an end-to-side manner using continuous 8-0  Prolene suture.  The pedicle was sutured to the epicardium with 6-0 Prolene  sutures.   Then attention was turned to mitral valve repair. The left atrium was opened  through a vertical incision in the intra-atrial groove.  The mitral valve  retractor was placed. Visibility was good although he had a very deep chest.  Examination of the native valve showed that there was a slight amount of  prolapse of the A2 portion of the anterior leaflet.  The posterior leaflet  appeared to somewhat restricted although there was no calcium present and  still fairly good motion of the posterior leaflets.  I felt that most likely  an annuloplasty ring would correct this regurgitation, but I decided to  place an artificial Gore-Tex chordae through the posterior medial papillary  muscle head in case this was needed for residual regurgitation secondary to  prolapse.  Then this Gore-Tex chordae was placed using a CV-4 double-arm  Gore-Tex suture with a small Gore-Tex pledget.  This was retracted out of  the away after placing it  through the papillary muscles. Then the left  atrial appendage was over sewn as two layers using continuous 3-0 Prolene  suture. Then a series of 2-0 Ethibond horizontal mattress sutures were  placed around the mitral annulus.  The position of the anterolateral and  posterior medial _______ were identified.  The anterior leaflet and the  inferior anterior commissure distal were measured, and a 28-mm [employer] Jude  Seguin annuloplasty ring was chosen.  This had model number SARP-28, serial  number [id number].  Then these sutures were placed through the annuloplasty  ring and the ring lowered into place.  The suture was tied sequentially.  The valve was tested with iced saline solution.  There was still a small  amount of  regurgitation where the A2 portion of the anterior leaflet was  slightly prolapsing.  The primary chords to this leaflet edge were slightly  elongated.  Therefore, the Gore-Tex chordae were sutured to the free edge of the A2 portion of the anterior leaflet and tied to the appropriate length.  The valve was again tested with iced saline solution, and there was minimal  residual regurgitation.  There was a small flap between A2 and A3 which was  closed with two sutures of 4-0 Ethibond.  The valve looked quite good at  this time with a symmetrical line of coaptation.  There was trivial residual  regurgitation.  I felt this to be an excellent result for this patient.  The  patient was then rewarmed to 37 degrees centigrade.  The left atrium was  closed in two-layers using a continuous 3-0 Prolene suture.  Then the left  side of the heart was de-aired.  The single proximal vein graft anastomosis  was then performed to the aortic root in an end-to-side manner using a  continuous 6-0 Prolene suture.  The clamp was removed from mammary pedicle.  There was rapid warming of the ventricular septum and return of spontaneous  ventricular fibrillation.  The cross clamp was removed at a time of  135  minutes, and the patient spontaneously converted to sinus rhythm.   The proximal and distal anastomosis appeared hemostatic  and lie of the graft  is satisfactory.  The graft markers were placed around the proximal  anastomosis.  Two temporary right ventricular and atrial pacing wires placed  and brought out through the skin.   When the patient was rewarmed to 37 degrees centigrade, he was weaned from  cardiopulmonary bypass on low-dose dopamine.  Total bypass time was 169  minutes. Cardiac function appeared good.  The mitral valve was examined with  the echocardiogram and showed good functioning.  There was trivial residual  regurgitation.  Protamine was then given and the venous and aortic cannulas  were removed without difficulty. Hemostasis was achieved.  The patient was  given 10 units of platelets due to thrombocytopenia with a platelet count of  90,000.  Hemostasis was achieved.  Three chest tubes were placed, one in the  posterior pericardium, one in the anterior mediastinum and one in the left  pleural space.  The pericardium was loosely reapproximated over the heart.  The sternum was closed with #6 stainless steel wires.  The fascia was closed  with two layers of #1 Vicryl suture.  The subcutaneous issue was closed with  continuous 2-0 Vicryl and the skin with 3-0 Vicryl subcuticular closure.  The lower extremity vein harvest site was closed in layers in a similar  manner.  Sponge, needle and instrument counts  were correct according to the scrub nurse.  Dry sterile dressing applied  over the incision, around the chest tubes which were hooked to Pleur-Evac  suction.  The patient remained hemodynamically stable and was transported to  the SICU in guarded but stable condition.      Evelene Croon, M.D.  Electronically Signed     BB/MEDQ  D:  05/18/2005  T:  05/19/2005  Job:  [id number]   cc:   Charlton Haws, M.D.  [address]  [address]  [place]  [place] [address]   CVTS  Lab  [facility]

## 2010-09-05 NOTE — Assessment & Plan Note (Signed)
[facility]                              CARDIOLOGY OFFICE NOTE   Javier [name], COUFAL                       MRN:          [id number]  DATE:11/11/2005                            DOB:          03/08/1930    PRIMARY CARDIOLOGIST:  Dr. Charlton Haws.   PRIMARY CARE PHYSICIAN:  Dr. Thomos Lemons.   PATIENT PROFILE:  A 78 year old white male with a prior history of single-  vessel CAD and severe mitral regurgitation, status post CABG and mitral  valve repair, who presents for followup following recent cardioversion.   PROBLEM LIST:  1.  Coronary artery disease.      1.  January 27, 2005, cardiac catheterization, right atrium 5, right          ventricle 47/5, pulmonary artery 43/17, mean of 27, pulmonary          capillary wedge pressure 12, cardiac output 5.0 L/min, cardiac index          2.2 L/min per sq m, ejection fraction 50% with 2+ mitral          regurgitation.  Left main normal.  Left anterior descending 70% at          the bifurcation of the second diagonal.  Circumflex normal.  Right          coronary artery 30% to 40% near the acute marginal.  Posterior          descending artery normal.      2.  May 18, 2005, coronary artery bypass graft x2 with a left          internal mammary artery to the left anterior descending and vein          graft to the second diagonal.  Mitral valve was also repaired at          this time.  2.  Moderate-to-severe mitral regurgitation, status post mitral valve      repair, January 2007, as above.  3.  Hypertension.  4.  Type 2 diabetes mellitus with hemoglobin A1c 6.7.  5.  History of medical nonadherence secondary to financial difficulties.  6.  History of osteoarthritis of the knees.  7.  History of testicular torsion.  8.  Atrial fibrillation.      1.  November 02, 2005, status post cardioversion.   HISTORY OF PRESENT ILLNESS:  [age]-year-old male who was last seen  in clinic, July 23, 2005, at which  time he was having rate-controlled atrial  fibrillation and flutter, but unfortunately was feeling very weak and washed  out.  At that point, the plans were made for elective cardioversion  following 4 consecutive weeks of therapeutic INR.  He finally underwent  cardioversion last week and tolerated this procedure well and remains today  in sinus rhythm.  His Pacerone was discontinued following cardioversion and  his metoprolol dose was dropped from 50 mg t.i.d. to 50 mg a half a tablet  daily.  He feels like he could wrestle a bear.  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  He is not currently exercising, but  does plan on getting out and walking.  He has not had any recurrent  palpitations, denies any chest pain, shortness of breath, PND, orthopnea,  dizziness or syncope.  He does have dependent lower extremity edema.  He  does not follow his blood pressure at home and it is elevated today.   CURRENT MEDICATIONS:  1.  Aspirin 81 mg daily.  2.  Folic acid 1 mg daily.  3.  Lovastatin 40 mg two tabs daily.  4.  Coumadin as directed.  5.  Lisinopril 40 mg daily.  6.  Metformin 500 mg daily.  7.  Metoprolol 50 mg a half tablet daily.   PHYSICAL EXAMINATION:  VITAL SIGNS:  Blood pressure 179/80, heart rate 65,  respirations 16 and he is afebrile.  His weight is 198 pounds.  GENERAL:  A pleasant white male in no acute distress, awake, alert and  oriented x3.  NECK:  No bruits or JVD.  LUNGS:  Respirations are regular and unlabored, clear to auscultation.  CARDIAC:  Regular S1 and S2. no S3 or S4, with a 2/6 systolic ejection  murmur at the right upper sternal border.  ABDOMEN:  Round, soft, nontender and non-distended.  Bowel sounds present  x4.  He does appear to have a ventral hernia.  EXTREMITIES:  Warm, dry and pink with chronic venous stasis changes of his  lower legs and 2+ pitting edema bilaterally.  Distal pulses are intact.   ACCESSORY CLINICAL  FINDINGS:  ECG shows sinus rhythm with a rate of 65 and  he has T wave inversions in I, aVL and V6, which were present previously.   ASSESSMENT AND PLAN:  1.  Atrial fibrillation:  The patient is status post cardioversion on November 02, 2005 and is doing remarkably better since then.  He remains on beta      blocker and  Coumadin therapy.  His Pacerone was discontinued post      cardioversion.  He is encouraged to begin exercising now that he is no      longer fatigued secondary to atrial fibrillation.  2.  Coronary artery disease:  The patient is doing well from this      standpoint.  He is status post two-vessel bypass in January of 2007.  He      is not having any chest pain or significant shortness of breath.  He      remains on aspirin, statin beta blocker and ACE inhibitor therapy.  3.  Hypertension:  Blood pressure is elevated today in clinic.  He does not      check it at home.  We will increase his metoprolol to 25 mg b.i.d.  He      has a followup with Dr. Artist Pais next month.  I have advised him to check his      blood pressure at least weekly at his local pharmacy and to record those      measurements for Dr. Artist Pais.  If his blood pressure is still elevated in a      month, we could consider adding a diuretic to his daily regimen,      especially given his chronic venous insufficiency and lower extremity      edema.  4.  Hyperlipidemia:  He remains on Lovastatin therapy and is tolerating this      well.  His last lipid panel was October 2006, at  which time he had an      LDL of 86 and a total cholesterol of 136 and LFTs were normal in      December of 2006.  5.  Type 2 diabetes mellitus, followed by Dr. Artist Pais:  He remains on Metformin      therapy.  6.  Obesity:  He is encouraged to initiate exercise.  7.  Disposition:  Follow up with Dr. Eden Emms in 3 months.                                Javier [name], ANP   CB/MedQ  DD:  11/11/2005  DT:  11/11/2005  Job #:  [id number]

## 2010-09-05 NOTE — Discharge Summary (Signed)
Javier [name]. Javier [name]  Patient:    Javier [name], Javier [name]                       MRN: [id number] Adm. Date:  [id number] Attending:  Madaline [name] Dictator:   Javier [name], M.D.                           Discharge Summary  DATE OF BIRTH:  December 09, 1919  DISCHARGE DIAGNOSES: 1. Gastroenteritis. 2. Hypertension. 3. Diabetes mellitus, type 2.  DISCHARGE MEDICATIONS: 1. Ciprofloxacin 750 mg one p.o. q.12h. x 5 days. 2. Metoprolol 25 mg one p.o. q.12h. 3. Glucotrol XL 5 mg one p.o. q.d. 4. Pepcid 20 mg one to two p.o. q.h.s. 5. Aspirin 325 mg one p.o. q.d.  DISPOSITION AND FOLLOWUP:  The patient was instructed to find an general internist in his home town to continue treatment of his hypertension and diabetes management.  He was encouraged to do so because his blood pressure and blood glucose would need to be followed closely for better control as an outpatient as opposed as what we could accomplish as an inpatient.  PROCEDURES PERFORMED:  None.  CONSULTATIONS:  Cardiology, Dr. Donnie [name].  HISTORY OF PRESENT ILLNESS:  Mr. Javier [name] is a 77 year old white male with a history of obesity, hypertension, and diabetes, both poorly controlled with no recent doctor visits in over 10 years.  He presented to the ER on August 18 with a history of having five to six explosive stools that morning with nausea and vomiting x 2 following eating breakfast at a fast food restaurant.  After having these episodes of emesis and diarrhea he had severe diaphoresis at around 11 a.m. and was brought to the ER by his boss.  He works at the [employer] currently.  While he was seen in the ER, ECG was performed and was found to be abnormal with evidence of left ventricular hypertrophy and questionable ischemic changes in his inferolateral leads.  CK enzymes were drawn at that point and his first CK and MB were elevated.  The patient was given IV fluid hydration because he was  found to be orthostatic on examination.  LABORATORY DATA:  Labs were drawn and at the time of admission the following labs were relevant.  White blood count 14.5, hemoglobin 15.5, platelets 212. Sodium 144, potassium 4.4, chloride 104, bicarb 31, BUN 20, creatinine 1.3, glucose 161.  SGOT was 30, SGPT 31.  Total protein 7.7, alkaline phosphatase 70, abdominal 4.3, total bilirubin 1.0.  Lipase 27, amylase 51.  CK total 284, CK-MB 4.7, relative index 1.7, troponin I 0.03.  PA and lateral chest x-ray were obtained at the time of admission and were negative for any acute disease.  Because of the patients ECG changes he was admitted to a telemetry bed, and was observed to have normal sinus rhythm with regularly occurring PACs.  He had one supraventricular ectopy with a heart rate of 157, which resolved spontaneously.  The patient was asymptomatic during this run.  Dr. Donnie [name], the cardiologist on-call at the time of admission was consulted to evaluate Mr. Javier [name].  After full evaluation, Dr. Tawana [name] impression was that there was no symptoms of angina or myocardial ischemia, and that his CPK was elevated but did not suggest evidence of infarction.  He suggested that Mr. Javier [name] consider having a stress test or Cardiolite in the future as an outpatient.  [name] COURSE: #  1 - GASTROENTERITIS:  The patient had an elevated white count on admission but remained afebrile throughout his stay.  He had repeated episodes of loose stools and was given IV hydration, a clear liquid diet and IV ciprofloxacin while stool studies were obtained.  Stool studies were negative at the time of discharge.  The patient remains afebrile throughout hospitalization and white count normalized by day #2 of admission.  The patient was not orthostatic at the time of discharge.  His IV ciprofloxacin was changed to p.o. meds.  He was instructed to continue dietary modification over the next several days while his diarrhea  resolved.  He was instructed to avoid all milk-based foods while on antibiotics and to advance his diet slowly as tolerated.  He is instructed to drink plenty of fluids and if he continued to have diarrhea to supplement his fluids with Gatorade for electrolyte replacement.  #2 - HYPERTENSION:  The patient was started on beta blocker with metoprolol 25 mg b.i.d.  Strict control of blood pressure was not achieved during hospitalization as patient had history of orthostatic hypertension due to hydration.  He was encouraged to follow up with a regular doctor for adjustment of his hypertensive meds.  #3 - DIABETES MELLITUS:  The patients random glucose on admission was 166. His glycosylated hemoglobin was checked on August 19 and was 5.8.  The patient was started on Glucotrol XL 5 mg q.d. and achieved normal glycemia on all subsequent CBGs.  The patient was given educational materials by dietary for diabetes management and was instructed again to follow up with his regular doctor upon discharge for maintenance of his diabetes management.  PERTINENT LABORATORY AT DISCHARGE:  The patients white blood count was 7.1, hemoglobin 14.3, hematocrit 40.7, MCV 86.9, platelet count 171.  Additionally, sodium 140, potassium 4.6, chloride 104, CO2 29, glucose 95, BUN 7, creatinine 1.0, calcium 8.4. DD:  12/09/99 TD:  12/09/99 Job: [id number]

## 2010-09-05 NOTE — Discharge Summary (Signed)
NAME:  Javier [name], Javier [name] NO.:  [id number]   MEDICAL RECORD NO.:  [id number]          PATIENT TYPE:  INP   LOCATION:  2027                         FACILITY:  [facility]   PHYSICIAN:  Pecola Leisure, PA   DATE OF BIRTH:  October 19, 1929   DATE OF ADMISSION:  05/18/2005  DATE OF DISCHARGE:                                 DISCHARGE SUMMARY   ADMISSION DIAGNOSES:  1.  Single vessel coronary disease.  2.  Mitral regurgitation.   PAST MEDICAL HISTORY/DISCHARGE DIAGNOSES:  1.  Coronary artery disease status post coronary artery bypass grafting x2.  2.  Mitral regurgitation status post mitral valve repair with insertion of      Gore-Tex __________ to anterior leaflet and insertion of 28 mm      annuloplastic ring.  3.  Diabetes mellitus type 2.  4.  Hypertension.  5.  History of osteoarthritis of the knees.  6.  History of testicular torsion.  7.  Congestive heart failure.  8.  Postoperative amenia, resolved.  9.  Thrombocytopenia, resolved.   ALLERGIES:  No known drug allergies.   BRIEF HISTORY:  The patient is a 77 year old Caucasian male with history of  hypertension and diabetes who was admitted in October 2006 with congestive  heart failure.  Cardiac catheterization was performed at the time of that  admission, and this revealed significant single vessel coronary disease as  well as moderate mitral regurgitation.  The left ventricle was somewhat  dilated and hypokinetic with an ejection fraction of 40%.  Transthoracic and  transesophageal echocardiograms showed moderate to severe mitral  regurgitation.  There was no significant aortic insufficiency or aortic  stenosis.  Secondary to these findings, the patient was referred to Dr.  Evelene Croon of [facility] for consideration of surgical repair.  Dr. Cornelius Moras  evaluated the patient in the office on March 10, 2005, and it was his  opinion that the patient should proceed with combined coronary artery bypass  graft surgery and  mitral valve repair.   HOSPITAL COURSE:  The patient was admitted and taken to the OR on May 18, 2005 for coronary artery bypass grafting x2 and mitral valve repair.  The left internal mammary artery was grafted to the LAD, and saphenous vein  was grafted to the second diagonal branch.  Endoscopic __________ was  performed on the right lower extremity.  Mitral valve replacement was  completed with insertion of a new Gore-Tex __________ and __________ leaflet  and insertion of an 28 mm annuloplastic ring in addition to oversewing of  the left atrial appendage.  The patient tolerated the procedure well and was  hemodynamically stable immediately postoperatively.  The patient was  transferred from OR to  SICU in stable condition.  The patient was extubated  without complication and woke up from anesthesia neurologically intact.   On postoperative day #1, the patient was afebrile with stable vital signs  and maintaining a normal sinus rhythm.  He has been evaluated  postoperatively, and has been diuresed accordingly.  On postop day #1, all  invasive chest tubes and monitors were discontinued  in a routine manner, and  he tolerated this well.  His diabetes mellitus was initially controlled with  Lantus and sliding scale insulin.  He was restarted on his good side on  postoperative day #2, and his blood sugars have been well maintained.  He  was subsequently discontinued from the Lantus insulin.   On postoperative day #1, the patient began cardiac rehabilitation and has  increased his tolerance to a satisfactory level at this time.  The patient  was also started on Coumadin on postoperative day #1 secondary to mitral  valve repair.  This will need to be continued for three months.   On postoperative day #2, the patient was noted to be anemic with hemoglobin  of 9 and hematocrit 25.6.  He was asymptomatic and was started on iron and  folic acid supplementation.  These have begun to improve  and are currently  stable at this time.  He was also noted to be thrombocytopenic with a  platelet count of 111,000.  These have also subsequently begun to trend up  and is stable at this time.  In the early morning of postoperative day #3,  the patient was noted to be in atrial fibrillation with the same rate of 130-  140.  The patient was started on IV Amiodarone.  He was given his beta  blocker, and he continued in atrial fibrillation throughout the day.  An  attempt to rapidly atrial pace him, after he went into atrial flutter with a  heart rate in the 30's, was attempted on the evening of postoperative day  #3.  This was successful initially.  However, he did not hold it and went  back into aflutter.  The IV Amiodarone was continued.   On postoperative day #4, the patient was noted to be in a sinus rhythm, and  the IV Amiodarone was discontinued.  The patient was started on p.o.  Amiodarone.  He is currently tolerating this well also.   On postoperative day #4, the patient is in sinus rhythm.  He is afebrile  with stable vital signs.  His diabetes mellitus is well controlled.  He is  alert, awake and is without complaints.  The lungs were clear.  Cardiac is  regular rate and rhythm.  Incisions are clean, dry and intact.  The edema in  his bilateral lower extremities is improving.  He has currently maintained a  normal sinus rhythm on p.o. Amiodarone.  His INR is subtherapeutic at that  time at 1.4.  Goal INR is 2, and this continued to be addressed throughout  the remainder of the hospital course.  He will be discharged home on an  appropriate dose of Coumadin and will need to be maintained with an INR of  approximately 2 for the next three months.  The patient is in stable  condition at this time, and, as long as he continues to progress in the  current manner, he will be ready for discharge in the next 1-2 days pending  morning rounds and reevaluation.  LABORATORY DATA:  CBC and  BMP on May 21, 2005:  White count 10,  hemoglobin 9.1, hematocrit 25.7, platelets 114,000.  Sodium 139, potassium  4.3, BUN 22, creatinine 1.2, glucose 79, INR on May 22, 2005, 1.4.   CONDITION ON DISCHARGE:  Improved.   DISCHARGE MEDICATIONS:  1.  Aspirin 81 mg daily.  2.  Toprol XL 25 mg daily.  3.  Lisinopril 40 mg daily.  4.  Zocor  40 mg daily.  5.  Coumadin dose to be determined prior to discharge.  6.  Lasix 40 mg daily x3 days.  7.  K-Dur 20 mEq daily x3 days.  8.  Glipizide 5 mg daily.  9.  Iron 325 mg daily.  10. Folic acid 1 mg daily.  11. Amiodarone 200 mg b.i.d.  12. Ultram 50 mg 1-2 q.4-6 h p.r.n. pain.   DISCHARGE INSTRUCTIONS:  1.  Activity:  No driving or lifting more than 10 pounds x3 weeks.  The      patient should continue daily breathing and walking exercises.  2.  Diet:  Low-salt, low-fat and carbohydrate-modified medium calorie.  3.  Wound Care:  The patient will shower daily and clean incisions with soap      and water.  If wound problems arise, he should contact the [facility] office.   FOLLOWUP:  1.  PT/INR blood work should be drawn two days after discharge by [facility].  They will be in charge of regular the patient's      Coumadin.  2.  Dr. Theodoro Clock two weeks after discharge.  [facility] will be      responsible for establishing the appointment, date and time for the      patient.  A chest x-ray will be taken at the time of that appointment.  3.  Dr. Laneta Simmers on June 16, 2005, at 11:15 a.m.      Pecola Leisure, PA     AY/MEDQ  D:  05/22/2005  T:  05/22/2005  Job:  [id number]   cc:   Charlton Haws, M.D.  [address]  [address]  [place]  [place] [address]

## 2010-09-05 NOTE — Consult Note (Signed)
[facility]  Patient:    Javier [name], Javier [name]                       MRN: [id number] Proc. Date: 12/07/99 Adm. Date:  [id number] Attending:  Madaline Guthrie CC:         Alvester Morin, M.D.   Consultation Report  HISTORY OF PRESENT ILLNESS:  I was asked to see this 77 year old male in consultation from the teaching service for cardiac opinion regarding an abnormal CPK and EKG.  The patient has a sketchy past history and has not seen a physician in 10-12 years.  He states that while he was incarcerated, he was diagnosed with borderline blood pressure and diabetes but has had no treatment for this for at least 10-12 years.  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  He had come in and eaten at a fast food restaurant and noted that the biscuits and gravy appeared to not taste well.  Around 10 a.m., he developed explosive diarrhea, nausea, vomiting, and some diaphoresis associated with the diarrhea and the vomiting.  He had no chest pain or shortness of breath.  He was seen at the emergency room but because of an abnormal EKG, CPKs were obtained.  The total CPKs have been elevated between 200 and 300.  Two of the MBs have not been significant.  One MB was just outside the normal range, but all of the ratios were normal.  Troponins have been negative.  On questioning the patient closely, he has no cardiac symptoms of any kind.  He denies shortness of breath, congestive heart failure, palpitations, PND, orthopnea, syncope, and has no cardiac symptoms suggestive of angina.  PAST MEDICAL HISTORY:  His past history is remarkable for borderline hypertension and diabetes previously.  His cholesterol status is unknown.  PAST SURGICAL HISTORY:  He has had a  testicular torsion in the past, and he has had knee surgery in the past.  ALLERGIES:  None.  MEDICATIONS:  His medicines on admission were none.  FAMILY HISTORY:  Father died of a stroke at age 36.  Mother died of a motor vehicle accident.  There is no family history of heart disease.  SOCIAL HISTORY:  He has been married twice.  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  Prior to that, he was incarcerated for selling cocaine.  He quit smoking 40 years ago and drinks occasional alcohol, two beers per week  REVIEW OF SYSTEMS:  He has no claudication or TIAs.  He does not have any anginal symptoms and is able to do work around the farm without difficulty. He has a history of gout several years ago.  PHYSICAL EXAMINATION:  GENERAL:  He is an obese male, who is currently in no acute distress.  VITAL SIGNS:  His blood pressure was 170/90, pulse was 76.  SKIN:  Warm and dry.  HEENT:  EOMI, PERRLA.  CNS clear.  NECK:  There are no carotid bruits.  LUNGS:  Clear.  CARDIOVASCULAR:  Normal S1 and S2, no S3.  ABDOMEN:  Obese and soft with mild tenderness.  EXTREMITIES:  Peripheral pulses were 2+.  There was no  edema.  LABORATORY DATA:  His 12-lead electrocardiogram shows voltage for LVH.  There are nonspecific ST and T-wave changes in the inferolateral leads that could be consistent with ischemia or left ventricular hypertrophy.  There is no old EKG to compare it with.  These changes have not evolved since admission.  IMPRESSION: 1. Clinical evidence of gastroenteritis, which is slowly resolving.  He did    have some episodes of diarrhea today. 2. No symptoms of angina or myocardial ischemia.  The diaphoresis he had    yesterday is in the setting of acute diarrhea and may have been a vagal    reaction to that. 3. Elevation of CPK without significant elevation of CPK-MB, no evidence of    enzymatic  evidence of infarction. 4. Occasional PACs noted. 5. Untreated hypertension. 6. Untreated diabetes. 7. Obesity.  RECOMMENDATIONS:  At the present time, the patient has nonspecific EKG changes which could be related to left ventricular hypertrophy.  There is no evidence of active ischemia or recent infarction.  His clinical event was gastroenteritis.  My recommendations would be to go ahead and bring his hypertension and diabetes under control and to consider obtaining an outpatient stress Cardiolite study when he is well over the acute phase of his illness.  There is no necessity for inpatient workup at this time. DD:  12/07/99 TD:  12/08/99 Job: [id number]

## 2010-09-11 ENCOUNTER — Other Ambulatory Visit: Payer: Self-pay | Admitting: *Deleted

## 2010-09-11 NOTE — Telephone Encounter (Signed)
Patient called requesting refills on his medication. He was asked which medication refills he were in need of, he stated that he was not sure, that the pharmacy is suppose to be contacting the office with his refill request. He stated that he needed to scheduled a follow up appointment with Dr Artist Pais, and wanted to know if I could assist him that. Appointment has been scheduled for 10/05/2008 at 10:45 with Dr. Artist Pais

## 2010-09-12 ENCOUNTER — Encounter: Payer: Self-pay | Admitting: Internal Medicine

## 2010-09-12 ENCOUNTER — Telehealth: Payer: Self-pay | Admitting: Internal Medicine

## 2010-09-12 MED ORDER — LISINOPRIL-HYDROCHLOROTHIAZIDE 20-25 MG PO TABS: 1.0000 | ORAL_TABLET | Freq: Every day | ORAL | Status: DC

## 2010-09-12 MED ORDER — AMLODIPINE BESYLATE 10 MG PO TABS: 10.0000 mg | ORAL_TABLET | Freq: Every day | ORAL | Status: DC

## 2010-09-12 MED ORDER — CARVEDILOL 6.25 MG PO TABS: 6.2500 mg | ORAL_TABLET | Freq: Two times a day (BID) | ORAL | Status: DC

## 2010-09-12 MED ORDER — FOLIC ACID 1 MG PO TABS: 1.0000 mg | ORAL_TABLET | Freq: Every day | ORAL | Status: DC

## 2010-09-12 MED ORDER — COLCHICINE 0.6 MG PO TABS: 0.6000 mg | ORAL_TABLET | Freq: Every day | ORAL | Status: DC

## 2010-09-12 NOTE — Telephone Encounter (Signed)
Refill- lisinopril/hctz 20-25mg  tab. Take 1 tablet by mouth once daily. Qty 30 last fill 4.20.12  Refill- carvedilol 6.25mg  tab. Take 1 tablet by mouth twice daily. Qty 60. Last fill 4.20.12  Refill- amlodipine besylate 10mg  tab. Take 1 tablet by mouth daily. Qty 30 last fill 4.20.12  Refill- folic acid 1mg  tab. Take 1 tablet by mouth once daily. Qty 32 last fill 3.27.12

## 2010-09-12 NOTE — Telephone Encounter (Signed)
Rx refills sent to pharnacy

## 2010-09-12 NOTE — Telephone Encounter (Signed)
Rx refill sent to pharmacy. 

## 2010-10-06 ENCOUNTER — Ambulatory Visit: Payer: Medicare Other | Admitting: Internal Medicine

## 2010-10-07 ENCOUNTER — Telehealth: Payer: Self-pay | Admitting: Internal Medicine

## 2010-10-07 NOTE — Telephone Encounter (Signed)
Refill- folic acid 1 mg. Take one tablet by mouth once daily. Qty 30. Last fill 5.25.12

## 2010-10-07 NOTE — Telephone Encounter (Signed)
Rx refill denied office visit due

## 2010-10-08 ENCOUNTER — Encounter: Payer: Self-pay | Admitting: Family

## 2010-10-08 ENCOUNTER — Ambulatory Visit (INDEPENDENT_AMBULATORY_CARE_PROVIDER_SITE_OTHER): Payer: Medicare Other | Admitting: Family

## 2010-10-08 DIAGNOSIS — E785 Hyperlipidemia, unspecified: Secondary | ICD-10-CM

## 2010-10-08 DIAGNOSIS — I10 Essential (primary) hypertension: Secondary | ICD-10-CM

## 2010-10-08 DIAGNOSIS — E119 Type 2 diabetes mellitus without complications: Secondary | ICD-10-CM

## 2010-10-08 DIAGNOSIS — E782 Mixed hyperlipidemia: Secondary | ICD-10-CM

## 2010-10-08 LAB — LIPID PANEL
Cholesterol: 115 mg/dL (ref 0–200)
Triglycerides: 118 mg/dL (ref <150)

## 2010-10-08 LAB — ALT: ALT: 19 U/L (ref 0–53)

## 2010-10-08 LAB — AST: AST: 30 U/L (ref 0–37)

## 2010-10-08 LAB — HEMOGLOBIN A1C: Mean Plasma Glucose: 131 mg/dL — ABNORMAL HIGH (ref <117)

## 2010-10-08 MED ORDER — FOLIC ACID 1 MG PO TABS: 1.0000 mg | ORAL_TABLET | Freq: Every day | ORAL | Status: DC

## 2010-10-08 NOTE — Assessment & Plan Note (Signed)
Will check LFT's as pt is on statin as well as FLP.

## 2010-10-08 NOTE — Assessment & Plan Note (Signed)
Diet controlled, check A1C, urine microalbumin today.

## 2010-10-08 NOTE — Assessment & Plan Note (Signed)
BP stable, continue current meds. 

## 2010-10-08 NOTE — Patient Instructions (Signed)
Please complete your blood work on the first floor today. Follow up in 3 months.

## 2010-10-08 NOTE — Progress Notes (Signed)
Subjective:    Patient ID: Javier [name], male    DOB: 1929-08-07, 77 y.o.   MRN: [id number]  HPI  Patient presents today for followup of hypertension.  Patient has been treated for Chronic HTN for quiet sometime. He is currently on amlodipine, coreg, and lisinopril-HCTZ. Pt is well controlled. No associated S/S related to HTN.   Quality: chronic Modifying factor: meds Duration: Quite sometime Associated S/S: None.  The patient denies the following associated symptoms: Chest pain, dyspnea, blurred vision, headache, or lower extremity edema.  DM2-  Notes that he has lost 20 pounds.  His highest weight was 260.  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      Review of Systems  See HPI  Past Medical History  Diagnosis Date  . CAD (coronary artery disease)   . Congestive heart failure   . Atrial fibrillation   . Hypertension   . Hyperlipidemia   . Leg cramps   . Bronchitis   . Shingles   . Chronic renal insufficiency   . Osteoarthritis   . Gout   . GERD (gastroesophageal reflux disease)   . Diabetes mellitus type II     History   Social History  . Marital Status: Legally Separated    Spouse Name: N/A    Number of Children: N/A  . Years of Education: N/A   Occupational History  . Not on file.   Social History Main Topics  . Smoking status: Former Games developer  . Smokeless tobacco: Not on file   Comment: quit 40 years ago-40 pack year history  . Alcohol Use: Not on file  . Drug Use: Not on file  . Sexually Active: Not on file   Other Topics Concern  . 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 Mitral valve repair 04/2005    s/p mitral valve repair  . Cardioversion 11/02/2005    s/p  . Coronary  artery bypass graft 05/18/2005    Family History  Problem Relation Age of Onset  . Diabetes      siblings    No Known Allergies  Current Outpatient Prescriptions on File Prior to Visit  Medication Sig Dispense Refill  . amLODipine (NORVASC) 10 MG tablet Take 1 tablet (10 mg total) by mouth daily.  30 tablet  1  . carvedilol (COREG) 6.25 MG tablet Take 1 tablet (6.25 mg total) by mouth 2 (two) times daily with a meal.  60 tablet  1  . colchicine 0.6 MG tablet Take 1 tablet (0.6 mg total) by mouth daily.  30 tablet  1  . folic acid (FOLVITE) 1 MG tablet Take 1 tablet (1 mg total) by mouth daily.  30 tablet  0  . lisinopril-hydrochlorothiazide (PRINZIDE,ZESTORETIC) 20-25 MG per tablet Take 1 tablet by mouth daily.  30 tablet  1  . lovastatin (MEVACOR) 40 MG tablet Take 40 mg by mouth. Take 2 tablets by mouth at bedtime       . DISCONTD: warfarin (COUMADIN) 4 MG tablet Take by mouth as directed.          BP 130/70  Pulse 67  Temp(Src) 97.7 F (36.5 C) (Oral)  Resp 18  Ht 5' 7.01" (  1.702 m)  Wt 208 lb 1.9 oz (94.403 kg)  BMI 32.59 kg/m2  SpO2 97%        Objective:   Physical Exam  Constitutional: He appears well-developed and well-nourished.  Cardiovascular: Normal rate and regular rhythm.   Pulmonary/Chest: Effort normal and breath sounds normal.  Skin:       + dry keratosis lesion noted on the dorsal aspect of the left foot (present x30 yrs per pt and unchanged)          Assessment & Plan:

## 2010-10-09 ENCOUNTER — Encounter: Payer: Self-pay | Admitting: Family

## 2010-10-09 LAB — BASIC METABOLIC PANEL WITH GFR
CO2: 27 mEq/L (ref 19–32)
Glucose, Bld: 106 mg/dL — ABNORMAL HIGH (ref 70–99)
Potassium: 4.8 mEq/L (ref 3.5–5.3)
Sodium: 142 mEq/L (ref 135–145)

## 2010-10-09 LAB — MICROALBUMIN / CREATININE URINE RATIO: Microalb Creat Ratio: 15.5 mg/g (ref 0.0–30.0)

## 2010-11-05 ENCOUNTER — Encounter: Payer: Self-pay | Admitting: Cardiovascular Disease

## 2010-11-05 ENCOUNTER — Ambulatory Visit (INDEPENDENT_AMBULATORY_CARE_PROVIDER_SITE_OTHER): Payer: Medicare Other | Admitting: Cardiovascular Disease

## 2010-11-05 DIAGNOSIS — Z79899 Other long term (current) drug therapy: Secondary | ICD-10-CM

## 2010-11-05 DIAGNOSIS — E782 Mixed hyperlipidemia: Secondary | ICD-10-CM

## 2010-11-05 DIAGNOSIS — I4891 Unspecified atrial fibrillation: Secondary | ICD-10-CM

## 2010-11-05 DIAGNOSIS — I2510 Atherosclerotic heart disease of native coronary artery without angina pectoris: Secondary | ICD-10-CM

## 2010-11-05 DIAGNOSIS — Z9889 Other specified postprocedural states: Secondary | ICD-10-CM

## 2010-11-05 DIAGNOSIS — I10 Essential (primary) hypertension: Secondary | ICD-10-CM

## 2010-11-05 NOTE — Patient Instructions (Addendum)
Your physician recommends that you schedule a follow-up appointment in: 6 MONTHS WITH DR [facility]  Your physician recommends that you continue on your current medications as directed. Please refer to the Current Medication list given to you today.  Your physician has recommended that you have a pulmonary function test. Pulmonary Function Tests are a group of tests that measure how well air moves in and out of your lungs. 6 MONTHS  FOR AMIODARONE THERAPY  Your physician recommends that you return for lab work in: 6 MONTHS FOR AMIODARONE

## 2010-11-05 NOTE — Assessment & Plan Note (Signed)
Cholesterol is at goal.  Continue current dose of statin and diet Rx.  No myalgias or side effects.  F/U  LFT's in 6 months. Lab Results  Component Value Date   LDLCALC 56 10/08/2010

## 2010-11-05 NOTE — Progress Notes (Signed)
Addended by: Scherrie Bateman E on: 11/05/2010 09:12 AM   Modules accepted: Orders

## 2010-11-05 NOTE — Assessment & Plan Note (Signed)
Stable no angina.  Continue ASA

## 2010-11-05 NOTE — Progress Notes (Signed)
Javier [name] is seen today for F/U of afib, MV dx and CAD He has a history of mitral valve repair with 2V bypass in October, 2006. This included LIMA to LAD and SVG to D2 He has had PAF. He has hypertension, type 2 diabetes He had been on pacerone in 2007 and had [facility] x1. He was found to be back in afib by Dr Artist Pais on 5/3. He was started on coumadin and referred for eval. He had successful [facility] on 10/08/09. His INR has been Rx and followed in clinic with appt today He continue to have moderate alcholol intake   ROS: Denies fever, malais, weight loss, blurry vision, decreased visual acuity, cough, sputum, SOB, hemoptysis, pleuritic pain, palpitaitons, heartburn, abdominal pain, melena, lower extremity edema, claudication, or rash.  All other systems reviewed and negative  General: Affect appropriate Healthy:  appears stated age HEENT: normal Neck supple with no adenopathy JVP normal no bruits no thyromegaly Lungs clear with no wheezing and good diaphragmatic motion Heart:  S1/S2 no murmur,rub, gallop or click PMI normal Abdomen: benighn, BS positve, no tenderness, no AAA no bruit.  No HSM or HJR Distal pulses intact with no bruits No edema Neuro non-focal Skin warm and dry No muscular weakness   Current Outpatient Prescriptions  Medication Sig Dispense Refill  . amLODipine (NORVASC) 10 MG tablet Take 1 tablet (10 mg total) by mouth daily.  30 tablet  1  . carvedilol (COREG) 6.25 MG tablet Take 1 tablet (6.25 mg total) by mouth 2 (two) times daily with a meal.  60 tablet  1  . colchicine 0.6 MG tablet Take 0.6 mg by mouth as needed.        . folic acid (FOLVITE) 1 MG tablet Take 1 tablet (1 mg total) by mouth daily.  30 tablet  5  . lisinopril-hydrochlorothiazide (PRINZIDE,ZESTORETIC) 20-25 MG per tablet Take 1 tablet by mouth daily.  30 tablet  1  . lovastatin (MEVACOR) 40 MG tablet Take 40 mg by mouth. Take 2 tablets by mouth at bedtime         Allergies  Review of patient's allergies indicates  no known allergies.  Electrocardiogram:  NSR 69 PR 252 LVH QT 450  Assessment and Plan

## 2010-11-05 NOTE — Assessment & Plan Note (Signed)
Continue amiodarone.  Coumadin D/C last year.  LFT;s, DLCO and TSH in 6 months

## 2010-11-05 NOTE — Assessment & Plan Note (Signed)
Soft SEM on exam.  F/U echo in a year should have SBE prophylaxis

## 2010-11-05 NOTE — Assessment & Plan Note (Signed)
Well controlled.  Continue current medications and low sodium Dash type diet.    

## 2010-11-13 ENCOUNTER — Telehealth: Payer: Self-pay | Admitting: Internal Medicine

## 2010-11-13 MED ORDER — LISINOPRIL-HYDROCHLOROTHIAZIDE 20-25 MG PO TABS: 1.0000 | ORAL_TABLET | Freq: Every day | ORAL | Status: DC

## 2010-11-13 MED ORDER — AMLODIPINE BESYLATE 10 MG PO TABS: 10.0000 mg | ORAL_TABLET | Freq: Every day | ORAL | Status: DC

## 2010-11-13 MED ORDER — CARVEDILOL 6.25 MG PO TABS: 6.2500 mg | ORAL_TABLET | Freq: Two times a day (BID) | ORAL | Status: DC

## 2010-11-13 NOTE — Telephone Encounter (Signed)
Refill- carvedilol 6.25mg  tab. Take one tablet by mouth twice daily. Qty 60. Last fill 6.19.12  Refill- lisinopril/hctz 20-25mg  tab. Take one tablet by mouth once daily. Qty 30. Last fill 6.19.12  Refill- amlodipine besylate 10mg  tab. Take one tablet by mouth daily. Qty 30. Last fill 6.19.12

## 2011-01-05 ENCOUNTER — Ambulatory Visit (INDEPENDENT_AMBULATORY_CARE_PROVIDER_SITE_OTHER): Payer: Medicare Other | Admitting: Family

## 2011-01-05 ENCOUNTER — Ambulatory Visit: Payer: Medicare Other | Admitting: Internal Medicine

## 2011-01-05 ENCOUNTER — Encounter: Payer: Self-pay | Admitting: Family

## 2011-01-05 VITALS — BP 120/80 | HR 66 | Temp 97.7°F | Resp 16 | Ht 67.0 in | Wt 215.0 lb

## 2011-01-05 DIAGNOSIS — N189 Chronic kidney disease, unspecified: Secondary | ICD-10-CM

## 2011-01-05 DIAGNOSIS — I509 Heart failure, unspecified: Secondary | ICD-10-CM

## 2011-01-05 DIAGNOSIS — I10 Essential (primary) hypertension: Secondary | ICD-10-CM

## 2011-01-05 DIAGNOSIS — G4733 Obstructive sleep apnea (adult) (pediatric): Secondary | ICD-10-CM

## 2011-01-05 DIAGNOSIS — E782 Mixed hyperlipidemia: Secondary | ICD-10-CM

## 2011-01-05 DIAGNOSIS — Z23 Encounter for immunization: Secondary | ICD-10-CM

## 2011-01-05 DIAGNOSIS — E119 Type 2 diabetes mellitus without complications: Secondary | ICD-10-CM

## 2011-01-05 LAB — BASIC METABOLIC PANEL
CO2: 25 mEq/L (ref 19–32)
Calcium: 9.3 mg/dL (ref 8.4–10.5)
Chloride: 107 mEq/L (ref 96–112)
Sodium: 141 mEq/L (ref 135–145)

## 2011-01-05 MED ORDER — LOVASTATIN 40 MG PO TABS: 40.0000 mg | ORAL_TABLET | Freq: Every day | ORAL | Status: DC

## 2011-01-05 MED ORDER — CARVEDILOL 6.25 MG PO TABS: 6.2500 mg | ORAL_TABLET | Freq: Two times a day (BID) | ORAL | Status: DC

## 2011-01-05 MED ORDER — AMLODIPINE BESYLATE 10 MG PO TABS: 10.0000 mg | ORAL_TABLET | Freq: Every day | ORAL | Status: DC

## 2011-01-05 MED ORDER — LISINOPRIL-HYDROCHLOROTHIAZIDE 20-25 MG PO TABS: 1.0000 | ORAL_TABLET | Freq: Every day | ORAL | Status: DC

## 2011-01-05 NOTE — Progress Notes (Signed)
Subjective:    Patient ID: Javier [name], male    DOB: April 26, 1929, 77 y.o.   MRN: [id number]  HPI  [name] is a 77 yr old male who presents today for follow up.  1. HTN-  He is on on lisinopril/hctz, norvasc and carvedilol.  Denies HA, chest pain or swelling in his feet  2.  DM2- last eye exam was about 1 yr ago.  He will schedule a follow up apt.  3.  CHF- sleeps on 1 pillow at night.  Denies DOE.  4. Hyperlipidemia- He continues lovastatin- denies myalgia.    5. Chronic renal insufficiency- Cr. 1.7 last visit.  6. OSA-reports that it was recommended that he wear CPAP but he refuses.     Review of Systems See HPI  Past Medical History  Diagnosis Date  . CAD (coronary artery disease)   . Congestive heart failure   . Atrial fibrillation   . Hypertension   . Hyperlipidemia   . Leg cramps   . Bronchitis   . Shingles   . Chronic renal insufficiency   . Osteoarthritis   . Gout   . GERD (gastroesophageal reflux disease)   . Diabetes mellitus type II     History   Social History  . Marital Status: Legally Separated    Spouse Name: N/A    Number of Children: N/A  . Years of Education: N/A   Occupational History  . Not on file.   Social History Main Topics  . Smoking status: Former Games developer  . Smokeless tobacco: Not on file   Comment: quit 40 years ago-40 pack year history  . Alcohol Use: Not on file  . Drug Use: Not on file  . Sexually Active: Not on file   Other Topics Concern  . 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 Mitral valve repair 04/2005    s/p mitral valve repair  . Cardioversion 11/02/2005    s/p  . Coronary artery bypass graft 05/18/2005    Family History  Problem Relation Age of Onset  . Diabetes     siblings    No Known Allergies  Current Outpatient Prescriptions on File Prior to Visit  Medication Sig Dispense Refill  . colchicine 0.6 MG tablet Take 0.6 mg by mouth as needed.        . folic acid (FOLVITE) 1 MG tablet Take 1 tablet (1 mg total) by mouth daily.  30 tablet  5    BP 120/80  Pulse 66  Temp(Src) 97.7 F (36.5 C) (Oral)  Resp 16  Ht 5\' 7"  (1.702 m)  Wt 215 lb (97.523 kg)  BMI 33.67 kg/m2       Objective:   Physical Exam  Constitutional: He appears well-developed and well-nourished.  Eyes: Conjunctivae are normal.  Neck: Normal range of motion. Neck supple. No thyromegaly present.  Cardiovascular: Normal rate and regular rhythm.   No murmur heard. Pulmonary/Chest: Effort normal and breath sounds normal. No respiratory distress. He has no wheezes. He has no rales. He exhibits no tenderness.  Musculoskeletal: He exhibits edema.       2-3+ bilateral LE edema.   Skin: Skin is warm and dry.  Psychiatric: He has a normal mood and affect. His  behavior is normal. Judgment and thought content normal.          Assessment & Plan:

## 2011-01-05 NOTE — Patient Instructions (Addendum)
Schedule your eye exam with you eye doctor this month. Complete your blood work on the first floor.  Follow up in 3 months.

## 2011-01-05 NOTE — Assessment & Plan Note (Signed)
LDL was 56 last visit and LFT's were normal. Continue mevacor.

## 2011-01-05 NOTE — Assessment & Plan Note (Signed)
Check BMET today to follow up on renal function.

## 2011-01-05 NOTE — Assessment & Plan Note (Signed)
Pt declines further work up or CPAP.

## 2011-01-05 NOTE — Assessment & Plan Note (Signed)
Pt is due for eye exam.  He wishes to schedule eye exam himself. Flu shot given today.

## 2011-01-05 NOTE — Assessment & Plan Note (Signed)
BP is stable, continue current meds.

## 2011-01-05 NOTE — Assessment & Plan Note (Signed)
LVEF 55-60% June 2011.  Suspect diastolic dysfunction.  Pt is noted to have significant LE edema today but lungs are clear and he denies significant DOE.  He declines addition of furosemide at this time.  He is instructed to contact us if he develops SOB- he verbalizes understanding.

## 2011-01-09 ENCOUNTER — Encounter: Payer: Self-pay | Admitting: Family

## 2011-02-12 ENCOUNTER — Ambulatory Visit: Payer: Self-pay | Admitting: Pharmacist

## 2011-02-12 DIAGNOSIS — I4891 Unspecified atrial fibrillation: Secondary | ICD-10-CM

## 2011-02-12 DIAGNOSIS — Z9889 Other specified postprocedural states: Secondary | ICD-10-CM

## 2011-02-12 DIAGNOSIS — I059 Rheumatic mitral valve disease, unspecified: Secondary | ICD-10-CM

## 2011-02-18 ENCOUNTER — Telehealth: Payer: Self-pay | Admitting: Cardiovascular Disease

## 2011-02-18 NOTE — Telephone Encounter (Signed)
Pt friend calling wanting to call in rx for lovastatin. Pt also needs to clarify dosage directions of medication. Please return pt call to discuss/clairfy.

## 2011-02-19 MED ORDER — LOVASTATIN 40 MG PO TABS: 40.0000 mg | ORAL_TABLET | Freq: Every day | ORAL | Status: DC

## 2011-02-19 NOTE — Telephone Encounter (Signed)
Spoke with pharm, questions answered Deliah Goody

## 2011-02-19 NOTE — Telephone Encounter (Signed)
Pharmacy calling wanting to clarify directions for pt lovastatin use. Please return call to discuss.

## 2011-03-31 ENCOUNTER — Telehealth: Payer: Self-pay | Admitting: *Deleted

## 2011-03-31 ENCOUNTER — Encounter: Payer: Self-pay | Admitting: Family

## 2011-03-31 ENCOUNTER — Ambulatory Visit (INDEPENDENT_AMBULATORY_CARE_PROVIDER_SITE_OTHER): Payer: Medicare Other | Admitting: Family

## 2011-03-31 DIAGNOSIS — E782 Mixed hyperlipidemia: Secondary | ICD-10-CM

## 2011-03-31 DIAGNOSIS — I509 Heart failure, unspecified: Secondary | ICD-10-CM

## 2011-03-31 DIAGNOSIS — E119 Type 2 diabetes mellitus without complications: Secondary | ICD-10-CM

## 2011-03-31 DIAGNOSIS — N289 Disorder of kidney and ureter, unspecified: Secondary | ICD-10-CM

## 2011-03-31 DIAGNOSIS — E785 Hyperlipidemia, unspecified: Secondary | ICD-10-CM

## 2011-03-31 LAB — HEPATIC FUNCTION PANEL
Alkaline Phosphatase: 48 U/L (ref 39–117)
Indirect Bilirubin: 0.6 mg/dL (ref 0.0–0.9)
Total Protein: 7.5 g/dL (ref 6.0–8.3)

## 2011-03-31 LAB — BASIC METABOLIC PANEL
CO2: 25 mEq/L (ref 19–32)
Calcium: 9.5 mg/dL (ref 8.4–10.5)
Chloride: 105 mEq/L (ref 96–112)
Sodium: 140 mEq/L (ref 135–145)

## 2011-03-31 MED ORDER — FUROSEMIDE 20 MG PO TABS: 20.0000 mg | ORAL_TABLET | Freq: Every day | ORAL | Status: DC

## 2011-03-31 MED ORDER — FUROSEMIDE 20 MG PO TABS: 20.0000 mg | ORAL_TABLET | Freq: Two times a day (BID) | ORAL | Status: DC

## 2011-03-31 MED ORDER — LOVASTATIN 40 MG PO TABS: 40.0000 mg | ORAL_TABLET | Freq: Every day | ORAL | Status: DC

## 2011-03-31 NOTE — Assessment & Plan Note (Signed)
Obtain A1C.  This is currently managed with diet only.  Eye exam as soon as he is able to afford it. He is up to date on his flu shot.

## 2011-03-31 NOTE — Assessment & Plan Note (Signed)
Obtain, lft's.  Continue statin.  Obtain FLP next visit.

## 2011-03-31 NOTE — Assessment & Plan Note (Signed)
Pt with LE edema.  Will add lasix 20mg  once daily to his regimen.

## 2011-03-31 NOTE — Progress Notes (Signed)
Subjective:    Patient ID: Javier [name], male    DOB: 1930/03/22, 77 y.o.   MRN: [id number]  HPI  [name] is a 77 yr old male who presents today for follow up.  1) hyperlipidemia- reports + compliance with lovastatin, denies myalgias.  2) DM2 reports that he has not been very compliant with the diabetic diet over the holidays.  He reports that he has not had any money for an eye exam.    3)CAD- this is followed by Dr. Eden [name]. He will see him in early January.  Denies chest pain or shortness of breath.    Review of Systems see HPI    Past Medical History  Diagnosis Date  . CAD (coronary artery disease)   . Congestive heart failure   . Atrial fibrillation   . Hypertension   . Hyperlipidemia   . Leg cramps   . Bronchitis   . Shingles   . Chronic renal insufficiency   . Osteoarthritis   . Gout   . GERD (gastroesophageal reflux disease)   . Diabetes mellitus type II     History   Social History  . Marital Status: Legally Separated    Spouse Name: N/A    Number of Children: N/A  . Years of Education: N/A   Occupational History  . Not on file.   Social History Main Topics  . Smoking status: Former Games developer  . Smokeless tobacco: Not on file   Comment: quit 40 years ago-40 pack year history  . Alcohol Use: Not on file  . Drug Use: Not on file  . Sexually Active: Not on file   Other Topics Concern  . 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 Mitral valve repair 04/2005    s/p mitral valve repair  . Cardioversion 11/02/2005    s/p  . Coronary artery bypass graft 05/18/2005    Family History  Problem Relation Age of Onset  . Diabetes      siblings    No Known Allergies  Current Outpatient Prescriptions on File Prior to Visit    Medication Sig Dispense Refill  . amLODipine (NORVASC) 10 MG tablet Take 1 tablet (10 mg total) by mouth daily.  30 tablet  3  . aspirin 81 MG tablet Take 81 mg by mouth daily.        . carvedilol (COREG) 6.25 MG tablet Take 1 tablet (6.25 mg total) by mouth 2 (two) times daily with a meal.  60 tablet  3  . colchicine 0.6 MG tablet Take 0.6 mg by mouth as needed.        . folic acid (FOLVITE) 1 MG tablet Take 1 tablet (1 mg total) by mouth daily.  30 tablet  5  . lisinopril-hydrochlorothiazide (PRINZIDE,ZESTORETIC) 20-25 MG per tablet Take 1 tablet by mouth daily.  30 tablet  3  . lovastatin (MEVACOR) 40 MG tablet Take 1 tablet (40 mg total) by mouth at bedtime. Take 2 tablets by mouth at bedtime  30 tablet  3    BP 142/80  Pulse 78  Temp(Src) 97.9 F (36.6 C) (Oral)  Resp 16  Ht 5\' 7"  (1.702 m)  Wt 227 lb 1.9 oz (103.021 kg)  BMI 35.57 kg/m2  Objective:   Physical Exam  Constitutional: He is oriented to person, place, and time. He appears well-developed and well-nourished.  Cardiovascular: Normal rate and regular rhythm.   No murmur heard. Pulmonary/Chest: Effort normal and breath sounds normal. No respiratory distress. He has no wheezes. He has no rales. He exhibits no tenderness.  Musculoskeletal:       2+ bilateral LE edema.   Neurological: He is alert and oriented to person, place, and time. No cranial nerve deficit.  Skin: Skin is warm and dry.  Psychiatric: He has a normal mood and affect. His behavior is normal. Judgment and thought content normal.          Assessment & Plan:

## 2011-03-31 NOTE — Telephone Encounter (Signed)
Message copied by Kathi Simpers on Tue Mar 31, 2011 11:14 AM ------      Message from: O'SULLIVAN, MELISSA      Created: Tue Mar 31, 2011  9:21 AM       I sent rx for lasix 20 to pt's pharmacy. Could you please call pharmacy and change the dosing schedule to once daily, not twice daily? Thanks.

## 2011-03-31 NOTE — Patient Instructions (Signed)
Please complete your blood work prior to leaving.  Follow up in 1 month so that we can re-evaluate your swelling.

## 2011-03-31 NOTE — Telephone Encounter (Signed)
Spoke to [employer], pharmacist and changed directions to 1 tablet daily.

## 2011-04-01 ENCOUNTER — Encounter: Payer: Self-pay | Admitting: Family

## 2011-04-01 ENCOUNTER — Other Ambulatory Visit: Payer: Self-pay | Admitting: *Deleted

## 2011-04-01 MED ORDER — LOVASTATIN 40 MG PO TABS: ORAL_TABLET | ORAL | Status: DC

## 2011-04-01 NOTE — Telephone Encounter (Signed)
Rx given verbally to pharmacist, he states they do participate with e-scribe.

## 2011-04-01 NOTE — Telephone Encounter (Signed)
Received e-Rx error that Mevacor refill did not transmit to pharmacy yesterday. Per Melissa, ok to give #60 x 2 refills. No answer at pharmacy, will try back later.

## 2011-04-02 ENCOUNTER — Telehealth: Payer: Self-pay | Admitting: Family

## 2011-04-02 MED ORDER — FOLIC ACID 1 MG PO TABS: 1.0000 mg | ORAL_TABLET | Freq: Every day | ORAL | Status: DC

## 2011-04-02 NOTE — Telephone Encounter (Signed)
Rx refill sent to pharmacy. 

## 2011-04-02 NOTE — Telephone Encounter (Signed)
Refill- folic acid 1mg  tab. Take one tablet by mouth once daily. Qty 30 last fill 11.16.12

## 2011-04-06 ENCOUNTER — Ambulatory Visit: Payer: Medicare Other | Admitting: Family

## 2011-05-11 ENCOUNTER — Encounter: Payer: Self-pay | Admitting: Family

## 2011-05-11 ENCOUNTER — Ambulatory Visit (INDEPENDENT_AMBULATORY_CARE_PROVIDER_SITE_OTHER): Payer: Medicare Other | Admitting: Family

## 2011-05-11 DIAGNOSIS — I4891 Unspecified atrial fibrillation: Secondary | ICD-10-CM

## 2011-05-11 DIAGNOSIS — L989 Disorder of the skin and subcutaneous tissue, unspecified: Secondary | ICD-10-CM

## 2011-05-11 DIAGNOSIS — R05 Cough: Secondary | ICD-10-CM | POA: Diagnosis not present

## 2011-05-11 DIAGNOSIS — D649 Anemia, unspecified: Secondary | ICD-10-CM

## 2011-05-11 DIAGNOSIS — E785 Hyperlipidemia, unspecified: Secondary | ICD-10-CM

## 2011-05-11 DIAGNOSIS — I10 Essential (primary) hypertension: Secondary | ICD-10-CM | POA: Diagnosis not present

## 2011-05-11 LAB — CBC WITH DIFFERENTIAL/PLATELET
Eosinophils Absolute: 0.6 10*3/uL (ref 0.0–0.7)
Eosinophils Relative: 5 % (ref 0–5)
Hemoglobin: 13.5 g/dL (ref 13.0–17.0)
Lymphs Abs: 2.3 10*3/uL (ref 0.7–4.0)
MCH: 30.9 pg (ref 26.0–34.0)
MCV: 93.8 fL (ref 78.0–100.0)
Monocytes Relative: 8 % (ref 3–12)
RBC: 4.37 MIL/uL (ref 4.22–5.81)

## 2011-05-11 LAB — BASIC METABOLIC PANEL WITH GFR
CO2: 27 mEq/L (ref 19–32)
Glucose, Bld: 122 mg/dL — ABNORMAL HIGH (ref 70–99)
Potassium: 5.1 mEq/L (ref 3.5–5.3)
Sodium: 142 mEq/L (ref 135–145)

## 2011-05-11 LAB — LIPID PANEL: HDL: 29 mg/dL — ABNORMAL LOW (ref >39)

## 2011-05-11 NOTE — Patient Instructions (Signed)
Please schedule a 3 month follow up for a medicare wellness exam. Come fasting to this appointment.

## 2011-05-11 NOTE — Progress Notes (Signed)
Subjective:    Patient ID: Javier [name], male    DOB: May 25, 1929, 78 y.o.   MRN: [id number]  HPI  [name] is a 78 yr old male who presents today for follow up.   1)Cough- started with flu like symptoms about 3 weeks ago.  It is improving.  Occasionally productive of white sputum.   2) Edema- improving, he is taking furosemide. Has lost 5 pounds.  Denies SOB.    3) Derm- he declines referral to dermatology.   4) AF-  Has hx of pig valve.  He reports that Dr. Artist Pais discontinued his coumadin.        Review of Systems    see HPI  Past Medical History  Diagnosis Date  . CAD (coronary artery disease)   . Congestive heart failure   . Atrial fibrillation   . Hypertension   . Hyperlipidemia   . Leg cramps   . Bronchitis   . Shingles   . Chronic renal insufficiency   . Osteoarthritis   . Gout   . GERD (gastroesophageal reflux disease)   . Diabetes mellitus type II     History   Social History  . Marital Status: Legally Separated    Spouse Name: N/A    Number of Children: N/A  . Years of Education: N/A   Occupational History  . Not on file.   Social History Main Topics  . Smoking status: Former Games developer  . Smokeless tobacco: Not on file   Comment: quit 40 years ago-40 pack year history  . Alcohol Use: Not on file  . Drug Use: Not on file  . Sexually Active: Not on file   Other Topics Concern  . 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 Mitral valve repair 04/2005    s/p mitral valve repair  . Cardioversion 11/02/2005    s/p  . Coronary artery bypass graft 05/18/2005    Family History  Problem Relation Age of Onset  . Diabetes      siblings    No Known Allergies  Current Outpatient Prescriptions on File Prior to Visit   Medication Sig Dispense Refill  . amLODipine (NORVASC) 10 MG tablet Take 1 tablet (10 mg total) by mouth daily.  30 tablet  3  . aspirin 81 MG tablet Take 81 mg by mouth daily.        . carvedilol (COREG) 6.25 MG tablet Take 1 tablet (6.25 mg total) by mouth 2 (two) times daily with a meal.  60 tablet  3  . colchicine 0.6 MG tablet Take 0.6 mg by mouth as needed.        . folic acid (FOLVITE) 1 MG tablet Take 1 tablet (1 mg total) by mouth daily.  30 tablet  5  . lisinopril-hydrochlorothiazide (PRINZIDE,ZESTORETIC) 20-25 MG per tablet Take 1 tablet by mouth daily.  30 tablet  3  . lovastatin (MEVACOR) 40 MG tablet Take 2 tablets by mouth at bedtime  60 tablet  2    BP 118/70  Pulse 78  Temp(Src) 97.9 F (36.6 C) (Oral)  Resp 16  Ht 5\' 7"  (1.702 m)  Wt 222 lb 1.9 oz (100.753 kg)  BMI 34.79 kg/m2    Objective:   Physical Exam  Constitutional: He appears well-developed and well-nourished. No distress.  Cardiovascular: Normal rate and regular rhythm.   No murmur heard. Pulmonary/Chest: Effort normal and breath sounds normal.  Abdominal: Soft. Bowel sounds are normal.  Musculoskeletal:       2+ bilateral LE edema  Skin: Skin is warm and dry.       Raised flakey lesion noted on right side of nose. Dry lesion noted on left side of nose.            Assessment & Plan:

## 2011-05-12 MED ORDER — FUROSEMIDE 20 MG PO TABS: 20.0000 mg | ORAL_TABLET | Freq: Every day | ORAL | Status: DC

## 2011-05-13 ENCOUNTER — Telehealth: Payer: Self-pay | Admitting: Family

## 2011-05-13 DIAGNOSIS — N289 Disorder of kidney and ureter, unspecified: Secondary | ICD-10-CM

## 2011-05-13 MED ORDER — FISH OIL 1000 MG PO CAPS: 2000.0000 mg | ORAL_CAPSULE | Freq: Two times a day (BID) | ORAL | Status: DC

## 2011-05-13 NOTE — Telephone Encounter (Signed)
Notified pt of instructions below. He does not currently have a kidney specialist and will proceed with referral. Advised pt to let us know if he doesn't hear from [place] in 1 week re: referral. Pt voices understanding.

## 2011-05-13 NOTE — Telephone Encounter (Signed)
Please call pt and let him know that I would like for him to see a kidney specialist due to his renal insufficiency if he does not already have one (referral pended). His kidney function is about the same as last visit.   I would also like for him to add fish oil as below for his cholesterol and continue to work on low fat/diabetic diet, exercise and weight loss.

## 2011-05-15 ENCOUNTER — Ambulatory Visit: Payer: Medicare Other | Admitting: Cardiovascular Disease

## 2011-05-16 ENCOUNTER — Other Ambulatory Visit: Payer: Self-pay | Admitting: Family

## 2011-05-18 ENCOUNTER — Telehealth: Payer: Self-pay | Admitting: Family

## 2011-05-18 ENCOUNTER — Other Ambulatory Visit: Payer: Self-pay | Admitting: Cardiovascular Disease

## 2011-05-18 DIAGNOSIS — R05 Cough: Secondary | ICD-10-CM | POA: Insufficient documentation

## 2011-05-18 DIAGNOSIS — L989 Disorder of the skin and subcutaneous tissue, unspecified: Secondary | ICD-10-CM | POA: Insufficient documentation

## 2011-05-18 MED ORDER — LISINOPRIL-HYDROCHLOROTHIAZIDE 20-25 MG PO TABS: 1.0000 | ORAL_TABLET | Freq: Every day | ORAL | Status: DC

## 2011-05-18 MED ORDER — CARVEDILOL 6.25 MG PO TABS: 6.2500 mg | ORAL_TABLET | Freq: Two times a day (BID) | ORAL | Status: DC

## 2011-05-18 MED ORDER — LOVASTATIN 40 MG PO TABS: ORAL_TABLET | ORAL | Status: DC

## 2011-05-18 MED ORDER — AMLODIPINE BESYLATE 10 MG PO TABS: 10.0000 mg | ORAL_TABLET | Freq: Every day | ORAL | Status: DC

## 2011-05-18 NOTE — Assessment & Plan Note (Signed)
I am concerned about lesion on the nose.  I discussed with pt possibility that this could be skin cancer.  Recommended referral to Derm.  He refuses at this time.

## 2011-05-18 NOTE — Assessment & Plan Note (Signed)
Likely residual from recent viral illness, improving. Monitor.

## 2011-05-18 NOTE — Assessment & Plan Note (Addendum)
Clinically stable.  Weight and edema is improving with addition of furosemide. Rate is stable. He is no longer on Coumadin.  Cardiology is aware.  Continue furosemide. Obtain BMET.

## 2011-05-18 NOTE — Telephone Encounter (Signed)
Lisinopril 20 mg 1 by mouth daily, lovastatin 40 mg 2 by mouth at bedtime , furosemide 20 mg take 1 per day.  amlodepine besylate 10 mg take 1 per day  Carvedilol 6.25 mg tab take 2 per day [facility] [phone]

## 2011-05-18 NOTE — Telephone Encounter (Signed)
Refill carvedilol 6.25

## 2011-05-18 NOTE — Telephone Encounter (Signed)
Lovastatin was phoned in on 04/01/11 # 60 x 2 refills. Should still have 1 refill remaining. Furosemide was phoned in on 05/11/11 #30 x 2 refills, should be at pharmacy. Refills sent for amlodipine #30 x 3 rf, lisinopril hct #30 x 3 rf and carvedilol #60 x 3 rf via eRx. Spoke to [employer] at pharmacy and he states lovastatin has refill on file and he will get it ready. He states he has no record of my phoned in rx of Furosemide on 05/11/11. Rx given to [facility] for #30 x 2 refills. Notified pt.

## 2011-05-26 ENCOUNTER — Encounter: Payer: Self-pay | Admitting: Cardiovascular Disease

## 2011-05-26 ENCOUNTER — Ambulatory Visit (INDEPENDENT_AMBULATORY_CARE_PROVIDER_SITE_OTHER): Payer: Medicare Other | Admitting: Cardiovascular Disease

## 2011-05-26 DIAGNOSIS — E782 Mixed hyperlipidemia: Secondary | ICD-10-CM

## 2011-05-26 DIAGNOSIS — Z9889 Other specified postprocedural states: Secondary | ICD-10-CM

## 2011-05-26 DIAGNOSIS — I10 Essential (primary) hypertension: Secondary | ICD-10-CM | POA: Diagnosis not present

## 2011-05-26 DIAGNOSIS — E119 Type 2 diabetes mellitus without complications: Secondary | ICD-10-CM | POA: Diagnosis not present

## 2011-05-26 DIAGNOSIS — I4891 Unspecified atrial fibrillation: Secondary | ICD-10-CM | POA: Diagnosis not present

## 2011-05-26 NOTE — Assessment & Plan Note (Signed)
SEM consider echo in a year of if he becomes symptomatic

## 2011-05-26 NOTE — Progress Notes (Signed)
Javier [name] is seen today for F/U of afib, MV dx and CAD He has a history of mitral valve repair with 2V bypass in October, 2006. This included LIMA to LAD and SVG to D2 He has had PAF. He has hypertension, type 2 diabetes He had been on pacerone in 2007 and had [facility] x1. He was found to be back in afib by Dr Artist Pais on 08/20/09. He was started on coumadinHe had successful [facility] on 10[facility]. His INR has been Rx and followed in clinic with appt today He continue to have moderate alcholol intake

## 2011-05-26 NOTE — Assessment & Plan Note (Signed)
Discussed low carb diet.  Target hemoglobin A1c is 6.5 or less.  Continue current medications.  

## 2011-05-26 NOTE — Patient Instructions (Signed)
Your physician wants you to follow-up in:   12MONTHS WITH  DR NISHAN You will receive a reminder letter in the mail two months in advance. If you don't receive a letter, please call our office to schedule the follow-up appointment. Your physician recommends that you continue on your current medications as directed. Please refer to the Current Medication list given to you today.  

## 2011-05-26 NOTE — Assessment & Plan Note (Signed)
Maint NSR on ASA coumadin stopped about 7 months ago

## 2011-05-26 NOTE — Assessment & Plan Note (Signed)
Cholesterol is at goal.  Continue current dose of statin and diet Rx.  No myalgias or side effects.  F/U  LFT's in 6 months. Lab Results  Component Value Date   LDLCALC 37 05/11/2011

## 2011-05-26 NOTE — Assessment & Plan Note (Signed)
Well controlled.  Continue current medications and low sodium Dash type diet.    

## 2011-06-15 DIAGNOSIS — D649 Anemia, unspecified: Secondary | ICD-10-CM | POA: Diagnosis not present

## 2011-06-15 DIAGNOSIS — N2581 Secondary hyperparathyroidism of renal origin: Secondary | ICD-10-CM | POA: Diagnosis not present

## 2011-07-07 ENCOUNTER — Other Ambulatory Visit: Payer: Self-pay | Admitting: *Deleted

## 2011-07-07 NOTE — Telephone Encounter (Signed)
Notified pt's daughter-in-law, Elease Hashimoto. She scheduled pt appt for 07/14/11 at 10:30 as she states pt requests appt next week due to other appts this week.

## 2011-07-07 NOTE — Telephone Encounter (Signed)
I would like to evaluate pt in office first please, as his last refill was back in 2010 by dr. Artist Pais and I have never evaluated him for gout.

## 2011-07-07 NOTE — Telephone Encounter (Signed)
Received message from pt's wife that pt needs refill of colchicine. Please advise re: quantity and refills.

## 2011-07-14 ENCOUNTER — Ambulatory Visit (INDEPENDENT_AMBULATORY_CARE_PROVIDER_SITE_OTHER): Payer: Medicare Other | Admitting: Family

## 2011-07-14 ENCOUNTER — Encounter: Payer: Self-pay | Admitting: Family

## 2011-07-14 VITALS — BP 118/74 | HR 79 | Temp 97.6°F | Resp 16 | Ht 67.0 in | Wt 228.0 lb

## 2011-07-14 DIAGNOSIS — E119 Type 2 diabetes mellitus without complications: Secondary | ICD-10-CM

## 2011-07-14 DIAGNOSIS — I10 Essential (primary) hypertension: Secondary | ICD-10-CM

## 2011-07-14 DIAGNOSIS — N289 Disorder of kidney and ureter, unspecified: Secondary | ICD-10-CM | POA: Diagnosis not present

## 2011-07-14 DIAGNOSIS — M109 Gout, unspecified: Secondary | ICD-10-CM

## 2011-07-14 DIAGNOSIS — E782 Mixed hyperlipidemia: Secondary | ICD-10-CM

## 2011-07-14 MED ORDER — COLCHICINE 0.6 MG PO TABS: ORAL_TABLET | ORAL | Status: DC

## 2011-07-14 NOTE — Progress Notes (Signed)
Subjective:    Patient ID: Javier [name], male    DOB: 25-May-1929, 78 y.o.   MRN: [id number]  HPI  Javier [name] is a 78 yr old male who presents today to discuss gout.  1) Gout- reports that he ate shrimp 1 week ago. Flared up. Took colcrys.  2) DM2-  Not checking sugars regularly.    3) HTN- he continues lisinopril hctz, amlodipine. Denies CP, SOB or LE edema.   4) Renal insufficiency- he met with Dr. Hyman Hopes on 06/15/11 for consultation.  5) hyperlipidemia- denies myalgias. Continues statin.    Review of Systems    see HPI  Past Medical History  Diagnosis Date  . CAD (coronary artery disease)   . Congestive heart failure   . Atrial fibrillation   . Hypertension   . Hyperlipidemia   . Leg cramps   . Bronchitis   . Shingles   . Chronic renal insufficiency   . Osteoarthritis   . Gout   . GERD (gastroesophageal reflux disease)   . Diabetes mellitus type II     History   Social History  . Marital Status: Legally Separated    Spouse Name: N/A    Number of Children: N/A  . Years of Education: N/A   Occupational History  . Not on file.   Social History Main Topics  . Smoking status: Former Games developer  . Smokeless tobacco: Not on file   Comment: quit 40 years ago-40 pack year history  . Alcohol Use: Not on file  . Drug Use: Not on file  . Sexually Active: Not on file   Other Topics Concern  . 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 Mitral valve repair 04/2005    s/p mitral valve repair  . Cardioversion 11/02/2005    s/p  . Coronary artery bypass graft 05/18/2005    Family History  Problem Relation Age of Onset  . Diabetes      siblings    No Known Allergies  Current Outpatient Prescriptions on File Prior to Visit  Medication  Sig Dispense Refill  . amLODipine (NORVASC) 10 MG tablet Take 1 tablet (10 mg total) by mouth daily.  30 tablet  3  . aspirin 81 MG tablet Take 81 mg by mouth daily.        . carvedilol (COREG) 6.25 MG tablet Take 1 tablet (6.25 mg total) by mouth 2 (two) times daily with a meal.  60 tablet  3  . folic acid (FOLVITE) 1 MG tablet Take 1 tablet (1 mg total) by mouth daily.  30 tablet  5  . furosemide (LASIX) 20 MG tablet TAKE 1 TABLET BY MOUTH ONCE A DAY  30 tablet  2  . lisinopril-hydrochlorothiazide (PRINZIDE,ZESTORETIC) 20-25 MG per tablet Take 1 tablet by mouth daily.  30 tablet  3  . lovastatin (MEVACOR) 40 MG tablet Take 2 tablets by mouth at bedtime  60 tablet  6  . Omega-3 Fatty Acids (FISH OIL) 1000 MG CAPS Take 2 capsules (2,000 mg total) by mouth 2 (two) times daily.    0    BP 118/74  Pulse 79  Temp(Src) 97.6 F (36.4 C) (Oral)  Resp 16  Ht 5\' 7"  (1.702 m)  Wt 228 lb (  103.42 kg)  BMI 35.71 kg/m2  SpO2 95%    Objective:   Physical Exam  Constitutional: He appears well-developed and well-nourished. No distress.  Cardiovascular: Normal rate and regular rhythm.   No murmur heard. Pulmonary/Chest: Effort normal and breath sounds normal. No respiratory distress. He has no wheezes. He has no rales. He exhibits no tenderness.  Musculoskeletal: He exhibits no edema.  Psychiatric: He has a normal mood and affect. His behavior is normal. Judgment and thought content normal.          Assessment & Plan:

## 2011-07-14 NOTE — Patient Instructions (Signed)
Please complete your lab work prior to leaving. Follow up in 3 months, sooner if problems/concerns.   

## 2011-07-14 NOTE — Assessment & Plan Note (Signed)
LDL at goal last visit on statin, continue.

## 2011-07-14 NOTE — Assessment & Plan Note (Addendum)
BP Readings from Last 3 Encounters:  07/14/11 118/74  05/26/11 132/71  05/11/11 118/70  BP is currently stable, continue current meds.

## 2011-07-14 NOTE — Assessment & Plan Note (Signed)
Will obtain A1C, clinically stable.

## 2011-07-14 NOTE — Assessment & Plan Note (Signed)
Was exacerbated by shrimp, now feeling better. Obtain uric acid. If elevated, consider addition of allopurinol.  PRN colcrys refill sent to pharmacy.

## 2011-07-15 LAB — BASIC METABOLIC PANEL
BUN: 35 mg/dL — ABNORMAL HIGH (ref 6–23)
CO2: 28 mEq/L (ref 19–32)
Glucose, Bld: 131 mg/dL — ABNORMAL HIGH (ref 70–99)
Potassium: 4.7 mEq/L (ref 3.5–5.3)
Sodium: 141 mEq/L (ref 135–145)

## 2011-07-15 LAB — URIC ACID: Uric Acid, Serum: 10.4 mg/dL — ABNORMAL HIGH (ref 4.0–7.8)

## 2011-07-16 ENCOUNTER — Telehealth: Payer: Self-pay | Admitting: Family

## 2011-07-16 MED ORDER — ALLOPURINOL 100 MG PO TABS: 100.0000 mg | ORAL_TABLET | Freq: Every day | ORAL | Status: DC

## 2011-07-16 NOTE — Telephone Encounter (Signed)
Notified pt. 

## 2011-07-16 NOTE — Telephone Encounter (Signed)
Pls call pt and let him know that uric acid level is up (this can cause gout attacks).  I would like him to add allopurinol once daily. Kidney function is stable.

## 2011-07-27 ENCOUNTER — Encounter: Payer: Medicare Other | Admitting: Family

## 2011-08-15 IMAGING — CR DG CHEST 2V
2 series · 2 of 2 positions shown · non-contrast
Comparison: 11/03/2005

CLINICAL DATA: Hypoxemia.  Status post fall 10 days ago on the
right lower anterior ribs and 2 liters ribs.  History of heart
surgery

CHEST - 2 VIEW

[w chest pa]
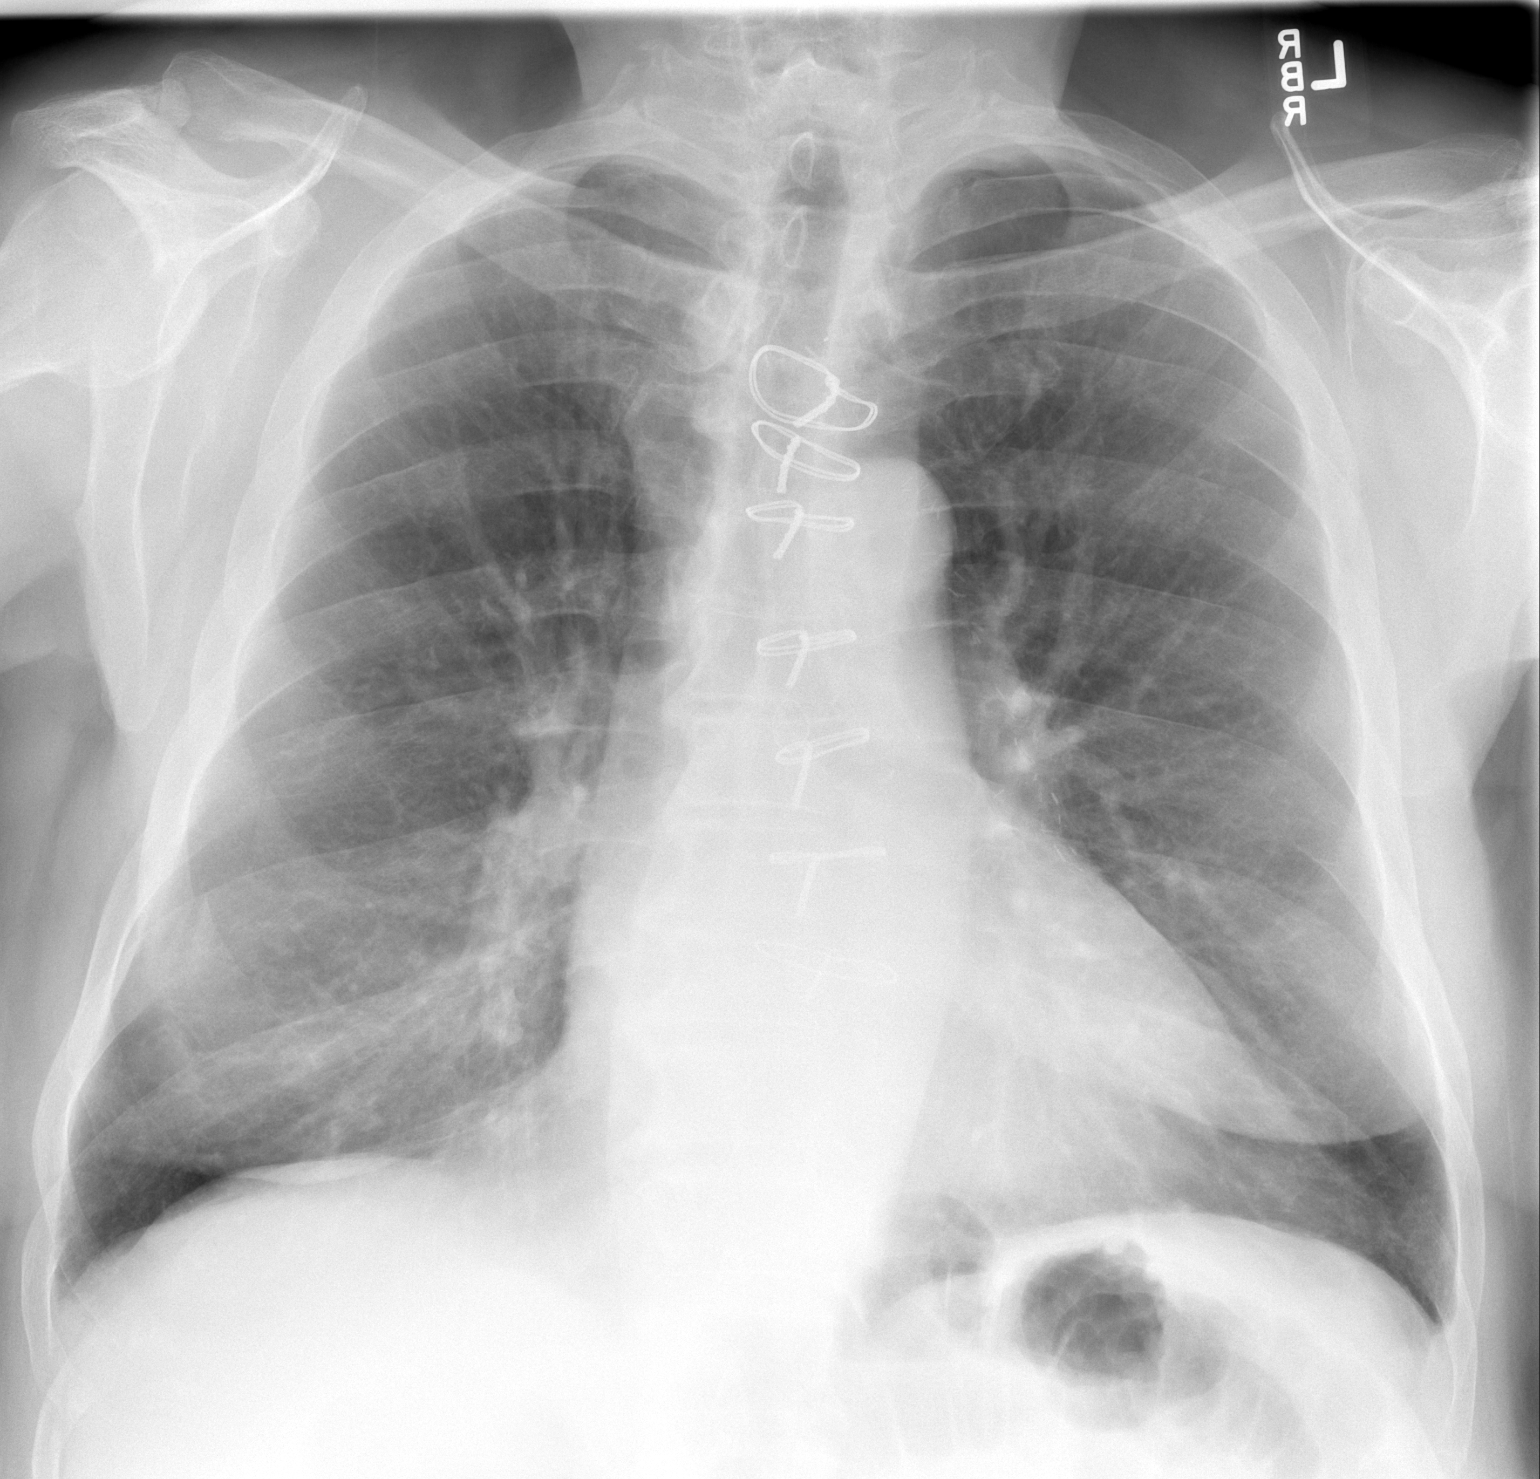

[w chest lat]
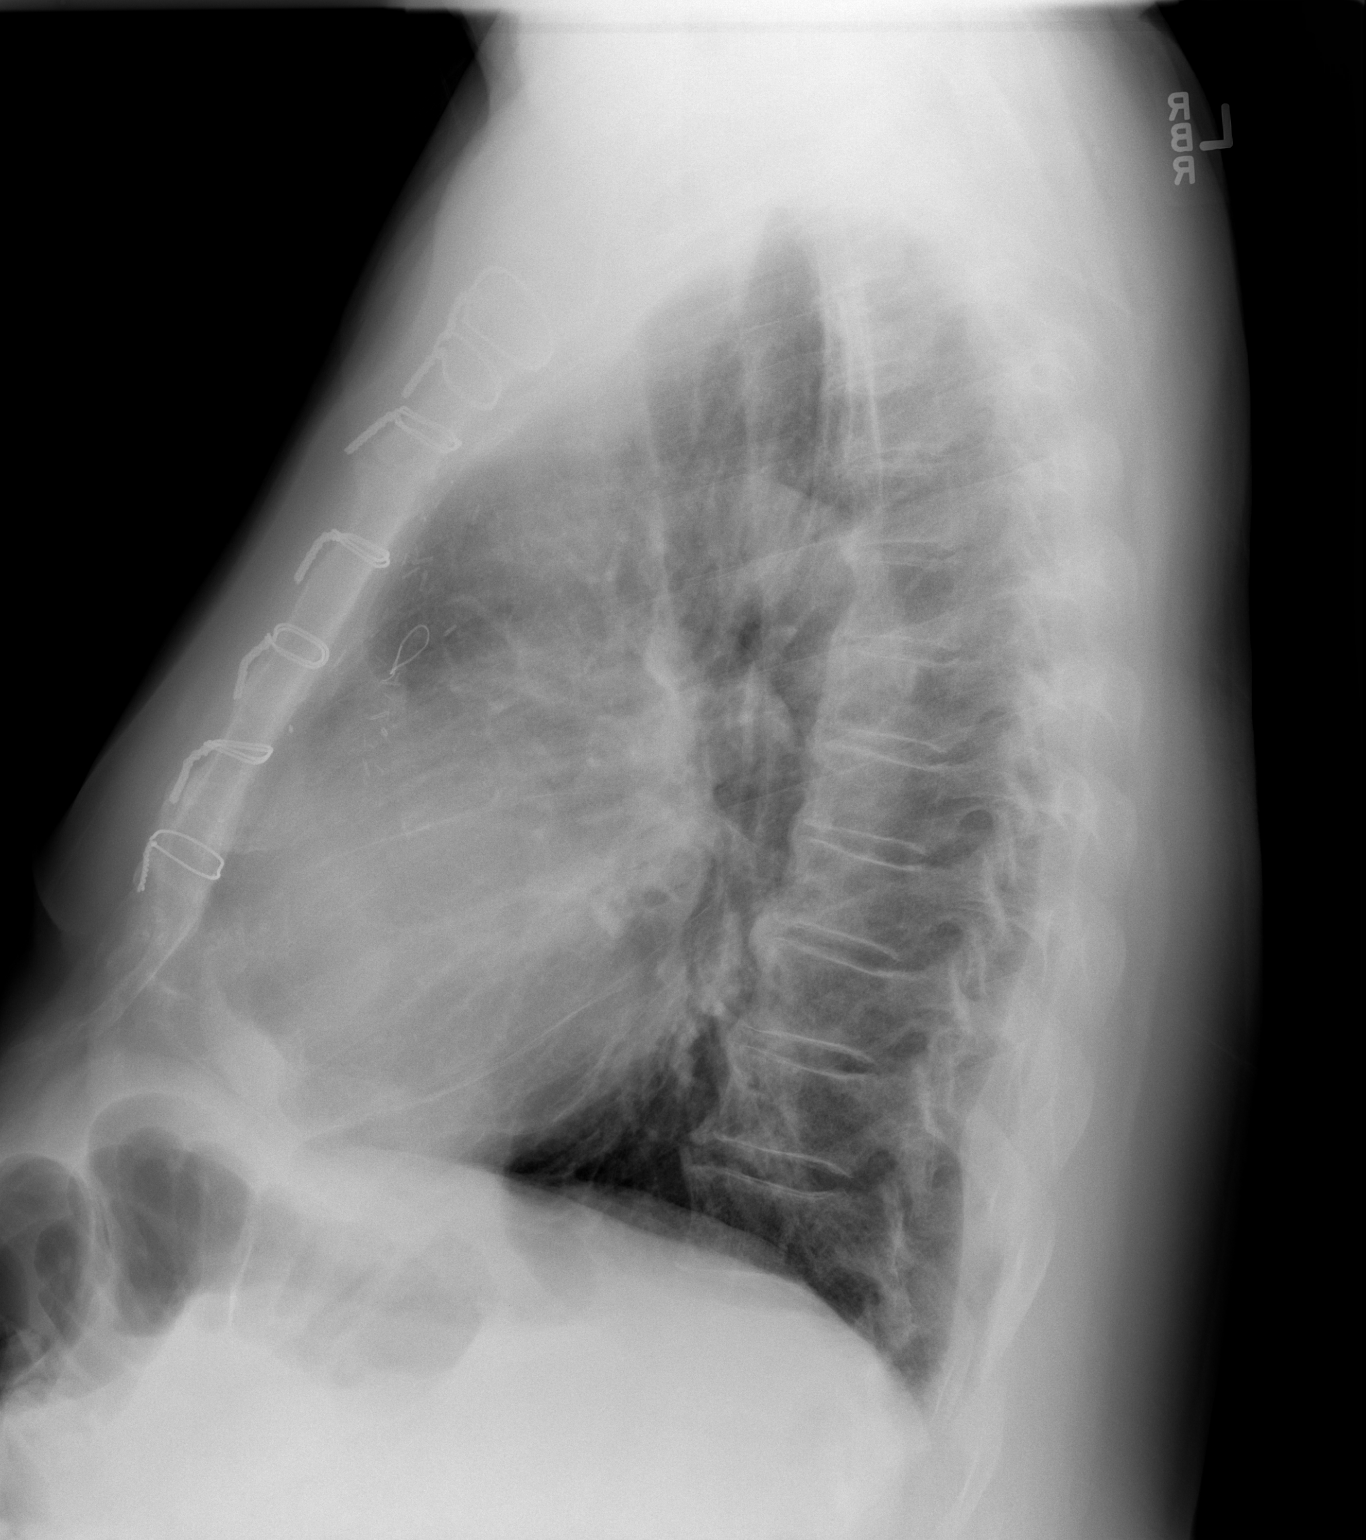

[2 of 2 positions shown; findings below may reference images not displayed]

FINDINGS: The patient is status post median sternotomy and CABG.
Heart mediastinal contours are stable with mild ectasia of the
thoracic aorta and heart size at the upper limits of normal.

Bony structures are notable for acute fractures of the anterior
lateral aspect of the right sixth, seventh and possibly the eighth
rib.  No definite associated periosteal reaction is seen.  A small
focus of increased density is identified inferior to the seventh
rib laterally and may represent small area of associated contusion.

The remainder of the lung fields appear clear with no signs of
focal infiltrate or congestive failure.  No pleural fluid,
peribronchial cuffing or pneumothorax is seen.
IMPRESSION: Right-sided acute rib fractures as described above.  Small focus of
increased density laterally in the right lower lung field which may
represent associated contusion given the history.  No pneumothorax
or pleural fluid.

## 2011-08-17 ENCOUNTER — Other Ambulatory Visit: Payer: Self-pay | Admitting: Cardiovascular Disease

## 2011-08-20 ENCOUNTER — Telehealth: Payer: Self-pay | Admitting: Family

## 2011-08-20 MED ORDER — FUROSEMIDE 20 MG PO TABS: 20.0000 mg | ORAL_TABLET | Freq: Every day | ORAL | Status: DC

## 2011-08-20 NOTE — Telephone Encounter (Signed)
Refill sent to pharmacy #30 x 2 refills. 

## 2011-08-25 DIAGNOSIS — N2581 Secondary hyperparathyroidism of renal origin: Secondary | ICD-10-CM | POA: Diagnosis not present

## 2011-08-25 DIAGNOSIS — D649 Anemia, unspecified: Secondary | ICD-10-CM | POA: Diagnosis not present

## 2011-08-28 ENCOUNTER — Other Ambulatory Visit: Payer: Self-pay | Admitting: Family

## 2011-09-02 ENCOUNTER — Other Ambulatory Visit: Payer: Self-pay | Admitting: Family

## 2011-09-04 ENCOUNTER — Other Ambulatory Visit: Payer: Self-pay | Admitting: Family

## 2011-09-04 NOTE — Telephone Encounter (Signed)
Rx refill sent to pharmacy. 

## 2011-10-05 ENCOUNTER — Encounter: Payer: Self-pay | Admitting: Family

## 2011-10-05 ENCOUNTER — Ambulatory Visit (INDEPENDENT_AMBULATORY_CARE_PROVIDER_SITE_OTHER): Payer: Medicare Other | Admitting: Family

## 2011-10-05 VITALS — BP 108/78 | HR 73 | Temp 97.3°F | Resp 16 | Wt 226.0 lb

## 2011-10-05 DIAGNOSIS — N289 Disorder of kidney and ureter, unspecified: Secondary | ICD-10-CM

## 2011-10-05 DIAGNOSIS — E119 Type 2 diabetes mellitus without complications: Secondary | ICD-10-CM | POA: Diagnosis not present

## 2011-10-05 DIAGNOSIS — I10 Essential (primary) hypertension: Secondary | ICD-10-CM

## 2011-10-05 DIAGNOSIS — M109 Gout, unspecified: Secondary | ICD-10-CM | POA: Diagnosis not present

## 2011-10-05 DIAGNOSIS — L989 Disorder of the skin and subcutaneous tissue, unspecified: Secondary | ICD-10-CM | POA: Diagnosis not present

## 2011-10-05 DIAGNOSIS — N189 Chronic kidney disease, unspecified: Secondary | ICD-10-CM

## 2011-10-05 LAB — BASIC METABOLIC PANEL
CO2: 28 mEq/L (ref 19–32)
Calcium: 10.3 mg/dL (ref 8.4–10.5)
Sodium: 141 mEq/L (ref 135–145)

## 2011-10-05 LAB — URIC ACID: Uric Acid, Serum: 10.8 mg/dL — ABNORMAL HIGH (ref 4.0–7.8)

## 2011-10-05 MED ORDER — COLCHICINE 0.6 MG PO TABS: ORAL_TABLET | ORAL | Status: DC

## 2011-10-05 MED ORDER — AMLODIPINE BESYLATE 10 MG PO TABS: 10.0000 mg | ORAL_TABLET | Freq: Every day | ORAL | Status: DC

## 2011-10-05 MED ORDER — FUROSEMIDE 20 MG PO TABS: 20.0000 mg | ORAL_TABLET | Freq: Every day | ORAL | Status: DC

## 2011-10-05 MED ORDER — CARVEDILOL 6.25 MG PO TABS: 6.2500 mg | ORAL_TABLET | Freq: Two times a day (BID) | ORAL | Status: DC

## 2011-10-05 MED ORDER — ALLOPURINOL 100 MG PO TABS: 100.0000 mg | ORAL_TABLET | Freq: Every day | ORAL | Status: DC

## 2011-10-05 NOTE — Assessment & Plan Note (Signed)
Reports no flares since allopurinol initiated. Check Uric acid level.

## 2011-10-05 NOTE — Progress Notes (Signed)
Subjective:    Patient ID: Javier [name], male    DOB: [birth year], 78 y.o.   MRN: [id number]  HPI  Javier [name] is a 78 yr old male who presents today for follow up.    1) Gout-  Last visit his Uric acid level was noted to be 10.  He was started on once daily allopurinol. No flare ups since he started.    2) Renal insufficiency- he is now being followed by nephrology.    3) DM2-  Reports that his sugars are "always good."    4) HTN-  Currently maintained on coreg, amlodipine, zestoretic and furosemide.    Review of Systems    denies cp, sob, swelling.  Past Medical History  Diagnosis Date  . CAD (coronary artery disease)   . Congestive heart failure   . Atrial fibrillation   . Hypertension   . Hyperlipidemia   . Leg cramps   . Bronchitis   . Shingles   . Chronic renal insufficiency   . Osteoarthritis   . Gout   . GERD (gastroesophageal reflux disease)   . Diabetes mellitus type II     History   Social History  . Marital Status: Legally Separated    Spouse Name: N/A    Number of Children: N/A  . Years of Education: N/A   Occupational History  . Not on file.   Social History Main Topics  . Smoking status: Former Games developer  . Smokeless tobacco: Not on file   Comment: quit 40 years ago-40 pack year history  . Alcohol Use: Not on file  . Drug Use: Not on file  . Sexually Active: Not on file   Other Topics Concern  . 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 Mitral valve repair 04/2005    s/p mitral valve repair  . Cardioversion 11/02/2005    s/p  . Coronary artery bypass graft 05/18/2005    Family History  Problem Relation Age of Onset  . Diabetes      siblings    No Known Allergies  Current Outpatient Prescriptions on File  Prior to Visit  Medication Sig Dispense Refill  . allopurinol (ZYLOPRIM) 100 MG tablet Take 1 tablet (100 mg total) by mouth daily.  30 tablet  2  . amLODipine (NORVASC) 10 MG tablet Take 1 tablet (10 mg total) by mouth daily.  30 tablet  3  . aspirin 81 MG tablet Take 81 mg by mouth daily.        . carvedilol (COREG) 6.25 MG tablet TAKE 1 TABLET BY MOUTH TWICE DAILY WITH A MEAL  60 tablet  1  . colchicine 0.6 MG tablet 2 tabs by mouth at start of gout flare.  Then one tablet 1 hour later.  6 tablet  2  . folic acid (FOLVITE) 1 MG tablet TAKE 1 TABLET BY MOUTH ONCE DAILY.  30 tablet  3  . furosemide (LASIX) 20 MG tablet Take 1 tablet (20 mg total) by mouth daily.  30 tablet  2  . lisinopril-hydrochlorothiazide (PRINZIDE,ZESTORETIC) 20-25 MG per tablet TAKE 1 TABLET BY MOUTH ONCE DAILY  30 tablet  6  . lovastatin (MEVACOR) 40 MG tablet Take 2 tablets by mouth at bedtime  60 tablet  6  . Omega-3 Fatty Acids (FISH OIL) 1000 MG CAPS Take 2 capsules (2,000 mg total) by mouth 2 (two) times daily.    0    BP 108/78  Pulse 73  Temp 97.3 F (36.3 C) (Oral)  Resp 16  Wt 226 lb 0.6 oz (102.531 kg)  SpO2 97%    Objective:   Physical Exam  Constitutional: He appears well-developed and well-nourished. No distress.  HENT:  Head: Normocephalic and atraumatic.  Cardiovascular: Normal rate and regular rhythm.   No murmur heard. Pulmonary/Chest: Effort normal and breath sounds normal. No respiratory distress. He has no wheezes. He has no rales. He exhibits no tenderness.  Musculoskeletal: He exhibits no edema.  Neurological: He is alert.  Skin: Skin is warm and dry.  Psychiatric: He has a normal mood and affect. His behavior is normal. Judgment and thought content normal.          Assessment & Plan:

## 2011-10-05 NOTE — Patient Instructions (Addendum)
Please complete your lab work prior to leaving. Please schedule a follow up appointment in 3 months.  

## 2011-10-05 NOTE — Assessment & Plan Note (Signed)
Nephrology signed off per pt.  Obtain BMET.

## 2011-10-05 NOTE — Assessment & Plan Note (Signed)
a1c at goal last visit. Clinically stable. Continue diabetic diet.

## 2011-10-05 NOTE — Assessment & Plan Note (Signed)
We again discussed need for skin biopsy for lesion right side of nose and left ear.  He refuses at this time.

## 2011-10-05 NOTE — Assessment & Plan Note (Signed)
BP Readings from Last 3 Encounters:  10/05/11 108/78  07/14/11 118/74  05/26/11 132/71   Stable on current meds.  Continue same, obtain bmet.

## 2011-11-23 ENCOUNTER — Telehealth: Payer: Self-pay | Admitting: Family

## 2011-11-23 NOTE — Telephone Encounter (Signed)
Refill-furosemide 20 mg tab. Take one tablet by mouth every day. Qty 30 last fill 7.5.13

## 2011-11-24 MED ORDER — FUROSEMIDE 20 MG PO TABS: 20.0000 mg | ORAL_TABLET | Freq: Every day | ORAL | Status: DC

## 2011-11-24 NOTE — Telephone Encounter (Signed)
Refill sent to [facility].  

## 2011-12-28 ENCOUNTER — Ambulatory Visit (INDEPENDENT_AMBULATORY_CARE_PROVIDER_SITE_OTHER): Payer: Medicare Other | Admitting: Family

## 2011-12-28 ENCOUNTER — Encounter: Payer: Self-pay | Admitting: Family

## 2011-12-28 VITALS — BP 116/70 | HR 58 | Temp 97.5°F | Resp 18 | Ht 67.0 in | Wt 226.1 lb

## 2011-12-28 DIAGNOSIS — I10 Essential (primary) hypertension: Secondary | ICD-10-CM

## 2011-12-28 DIAGNOSIS — Z23 Encounter for immunization: Secondary | ICD-10-CM | POA: Diagnosis not present

## 2011-12-28 DIAGNOSIS — E785 Hyperlipidemia, unspecified: Secondary | ICD-10-CM

## 2011-12-28 DIAGNOSIS — N189 Chronic kidney disease, unspecified: Secondary | ICD-10-CM

## 2011-12-28 DIAGNOSIS — I509 Heart failure, unspecified: Secondary | ICD-10-CM

## 2011-12-28 DIAGNOSIS — M109 Gout, unspecified: Secondary | ICD-10-CM | POA: Diagnosis not present

## 2011-12-28 DIAGNOSIS — E119 Type 2 diabetes mellitus without complications: Secondary | ICD-10-CM | POA: Diagnosis not present

## 2011-12-28 LAB — BASIC METABOLIC PANEL WITH GFR
CO2: 27 mEq/L (ref 19–32)
Chloride: 104 mEq/L (ref 96–112)
Potassium: 4.4 mEq/L (ref 3.5–5.3)
Sodium: 140 mEq/L (ref 135–145)

## 2011-12-28 LAB — HEPATIC FUNCTION PANEL
Albumin: 4.5 g/dL (ref 3.5–5.2)
Total Protein: 7.8 g/dL (ref 6.0–8.3)

## 2011-12-28 LAB — HEMOGLOBIN A1C
Hgb A1c MFr Bld: 6.8 % — ABNORMAL HIGH (ref <5.7)
Mean Plasma Glucose: 148 mg/dL — ABNORMAL HIGH (ref <117)

## 2011-12-28 MED ORDER — ALLOPURINOL 100 MG PO TABS: 100.0000 mg | ORAL_TABLET | Freq: Every day | ORAL | Status: DC

## 2011-12-28 MED ORDER — FOLIC ACID 1 MG PO TABS: 1.0000 mg | ORAL_TABLET | Freq: Every day | ORAL | Status: DC

## 2011-12-28 MED ORDER — COLCHICINE 0.6 MG PO TABS: ORAL_TABLET | ORAL | Status: DC

## 2011-12-28 MED ORDER — AMLODIPINE BESYLATE 10 MG PO TABS: 10.0000 mg | ORAL_TABLET | Freq: Every day | ORAL | Status: DC

## 2011-12-28 MED ORDER — FUROSEMIDE 20 MG PO TABS: 20.0000 mg | ORAL_TABLET | Freq: Every day | ORAL | Status: DC

## 2011-12-28 MED ORDER — CARVEDILOL 6.25 MG PO TABS: 6.2500 mg | ORAL_TABLET | Freq: Two times a day (BID) | ORAL | Status: DC

## 2011-12-28 NOTE — Assessment & Plan Note (Signed)
Obtain A1C.  Diabetic foot exam performed today.  Flu shot today.  Urine microalbumin.

## 2011-12-28 NOTE — Progress Notes (Signed)
Subjective:    Patient ID: Javier [name], male    DOB: Mar 09, 1930, 78 y.o.   MRN: [id number]  HPI  [name] is an 78 yr old male who presents today for follow up.  1) Renal insufficiency-  Pt was following with Nephrology but they have signed off.  2) Gout- Pt continues allopurinol. Had gout flare up 2 weeks ago.  Took colcrys, resolved symptoms.   3) HTN- Pt continues on lisinopril-hctz.  4) DM2- remains diet controlled.     Review of Systems See HPI  Past Medical History  Diagnosis Date  . CAD (coronary artery disease)   . Congestive heart failure   . Atrial fibrillation   . Hypertension   . Hyperlipidemia   . Leg cramps   . Bronchitis   . Shingles   . Chronic renal insufficiency   . Osteoarthritis   . Gout   . GERD (gastroesophageal reflux disease)   . Diabetes mellitus type II     History   Social History  . Marital Status: Legally Separated    Spouse Name: N/A    Number of Children: N/A  . Years of Education: N/A   Occupational History  . Not on file.   Social History Main Topics  . Smoking status: Former Games developer  . Smokeless tobacco: Not on file   Comment: quit 40 years ago-40 pack year history  . Alcohol Use: Not on file  . Drug Use: Not on file  . Sexually Active: Not on file   Other Topics Concern  . 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 Mitral valve repair 04/2005    s/p mitral valve repair  . Cardioversion 11/02/2005    s/p  . Coronary artery bypass graft 05/18/2005    Family History  Problem Relation Age of Onset  . Diabetes      siblings    No Known Allergies  Current Outpatient Prescriptions on File Prior to Visit  Medication Sig Dispense Refill  . allopurinol (ZYLOPRIM) 100 MG tablet Take 1  tablet (100 mg total) by mouth daily.  30 tablet  2  . amLODipine (NORVASC) 10 MG tablet Take 1 tablet (10 mg total) by mouth daily.  30 tablet  3  . aspirin 81 MG tablet Take 81 mg by mouth daily.        . carvedilol (COREG) 6.25 MG tablet Take 1 tablet (6.25 mg total) by mouth 2 (two) times daily with a meal.  60 tablet  2  . colchicine 0.6 MG tablet 2 tabs by mouth at start of gout flare.  Then one tablet 1 hour later.  6 tablet  2  . folic acid (FOLVITE) 1 MG tablet TAKE 1 TABLET BY MOUTH ONCE DAILY.  30 tablet  3  . furosemide (LASIX) 20 MG tablet Take 1 tablet (20 mg total) by mouth daily.  30 tablet  2  . lisinopril-hydrochlorothiazide (PRINZIDE,ZESTORETIC) 20-25 MG per tablet TAKE 1 TABLET BY MOUTH ONCE DAILY  30 tablet  6  . lovastatin (MEVACOR) 40 MG tablet Take 2 tablets by mouth at bedtime  60 tablet  6  . Omega-3 Fatty Acids (FISH OIL) 1000 MG CAPS Take 2 capsules (2,000 mg total) by mouth 2 (two) times  daily.    0    BP 116/70  Pulse 58  Temp 97.5 F (36.4 C) (Oral)  Resp 18  Ht 5\' 7"  (1.702 m)  Wt 226 lb 1.3 oz (102.549 kg)  BMI 35.41 kg/m2  SpO2 96%       Objective:   Physical Exam  Constitutional: He appears well-developed and well-nourished. No distress.  Cardiovascular: Normal rate and regular rhythm.   No murmur heard. Pulmonary/Chest: Effort normal and breath sounds normal. No respiratory distress. He has no wheezes. He has no rales. He exhibits no tenderness.  Musculoskeletal:       1+ bilateral LE edema.           Assessment & Plan:

## 2011-12-28 NOTE — Patient Instructions (Addendum)
Please complete your lab work prior to leaving. Please schedule a follow up appointment in 3 months.  

## 2011-12-29 ENCOUNTER — Telehealth: Payer: Self-pay | Admitting: Family

## 2011-12-29 LAB — MICROALBUMIN / CREATININE URINE RATIO
Creatinine, Urine: 90.7 mg/dL
Microalb Creat Ratio: 6.9 mg/g (ref 0.0–30.0)

## 2011-12-29 NOTE — Telephone Encounter (Signed)
Notified pt. 

## 2011-12-29 NOTE — Telephone Encounter (Signed)
Pls call pt and let him know uric acid level is elevated.  This can cause gout flares.  I would like him to start taking the allopurinol every day bid please.  Kidney function is stable.  Diabetes control looks ok.

## 2011-12-30 NOTE — Assessment & Plan Note (Signed)
Stable on current meds.  Continue same.   BP Readings from Last 3 Encounters:  12/28/11 116/70  10/05/11 108/78  07/14/11 118/74

## 2011-12-30 NOTE — Assessment & Plan Note (Signed)
Clinically stable, continue lasix.

## 2011-12-30 NOTE — Assessment & Plan Note (Signed)
Creatinine is stable.  Monitor.  

## 2011-12-30 NOTE — Assessment & Plan Note (Signed)
Symptoms resolved with colcrys.  Recommended that he start taking allopurinol bid and regularly to prevent flare ups.

## 2012-02-26 ENCOUNTER — Telehealth: Payer: Self-pay | Admitting: *Deleted

## 2012-02-26 NOTE — Telephone Encounter (Signed)
Received call from pt's daughter in law stating pt has run our of Lisinopril and needs a refill. Upon review of pt's record it appears that he should not be needing refill at this time and there should still be a refill on file at pt's pharmacy. Spoke to pt re: request and he states that he keeps running our of his medications early and the pharmacy tells him it is too soon for ins. To pay for them again. Pt states his family fixes his pill boxes for 2 weeks at a time and he only takes his medications from these. Spoke to family member and she reports that she and a family friend fix pt's medications. She reports that she is not sure how he is running out early. Advised her to put remaining medication bottles out of pt reach and monitor usage more closely and let us know if medications still come up short.

## 2012-03-21 ENCOUNTER — Ambulatory Visit (INDEPENDENT_AMBULATORY_CARE_PROVIDER_SITE_OTHER): Payer: Medicare Other | Admitting: Family

## 2012-03-21 ENCOUNTER — Encounter: Payer: Self-pay | Admitting: Family

## 2012-03-21 VITALS — BP 120/68 | HR 75 | Temp 98.0°F | Resp 16 | Ht 67.0 in | Wt 229.1 lb

## 2012-03-21 DIAGNOSIS — E119 Type 2 diabetes mellitus without complications: Secondary | ICD-10-CM

## 2012-03-21 DIAGNOSIS — I509 Heart failure, unspecified: Secondary | ICD-10-CM

## 2012-03-21 DIAGNOSIS — E782 Mixed hyperlipidemia: Secondary | ICD-10-CM | POA: Diagnosis not present

## 2012-03-21 DIAGNOSIS — N189 Chronic kidney disease, unspecified: Secondary | ICD-10-CM | POA: Diagnosis not present

## 2012-03-21 MED ORDER — LOVASTATIN 40 MG PO TABS: ORAL_TABLET | ORAL | Status: DC

## 2012-03-21 MED ORDER — LISINOPRIL-HYDROCHLOROTHIAZIDE 20-25 MG PO TABS: 1.0000 | ORAL_TABLET | Freq: Every day | ORAL | Status: DC

## 2012-03-21 NOTE — Patient Instructions (Addendum)
Please return fasting to the lab after 12/10 for lab work.  Follow up in 3 months (90 days after blood work). Happy Holidays!

## 2012-03-21 NOTE — Progress Notes (Signed)
Subjective:    Patient ID: Javier [name], male    DOB: 08-Dec-1929, 78 y.o.   MRN: [id number]  HPI  Mr. Grattan is an 78 yr old male who presents today for follow up.  1) DM2-  This remains diet controlled. He reports that he checks sugars occasionally.  Usually aroune 100.  He reports regular exercise.    2) CHF/AF- he continues his current medications.  Has Follow up with cardiology after the new year.   3) CKD- Was seeing Renal- they have since signed off.    4) Hyperlipidemia-  He is maintained on mevacor.    Review of Systems  Cardiovascular: Negative for chest pain and leg swelling.   See HPI  Past Medical History  Diagnosis Date  . CAD (coronary artery disease)   . Congestive heart failure   . Atrial fibrillation   . Hypertension   . Hyperlipidemia   . Leg cramps   . Bronchitis   . Shingles   . Chronic renal insufficiency   . Osteoarthritis   . Gout   . GERD (gastroesophageal reflux disease)   . Diabetes mellitus type II     History   Social History  . Marital Status: Legally Separated    Spouse Name: N/A    Number of Children: N/A  . Years of Education: N/A   Occupational History  . Not on file.   Social History Main Topics  . Smoking status: Former Games developer  . Smokeless tobacco: Not on file     Comment: quit 40 years ago-40 pack year history  . Alcohol Use: Not on file  . Drug Use: Not on file  . Sexually Active: Not on file   Other Topics Concern  . 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 Mitral valve repair 04/2005    s/p mitral valve repair  . Cardioversion 11/02/2005    s/p  . Coronary artery bypass graft 05/18/2005    Family History  Problem Relation Age of Onset  . Diabetes      siblings    No Known  Allergies  Current Outpatient Prescriptions on File Prior to Visit  Medication Sig Dispense Refill  . allopurinol (ZYLOPRIM) 100 MG tablet Take 1 tablet (100 mg total) by mouth daily.  30 tablet  5  . amLODipine (NORVASC) 10 MG tablet Take 1 tablet (10 mg total) by mouth daily.  30 tablet  5  . aspirin 81 MG tablet Take 81 mg by mouth daily.        . carvedilol (COREG) 6.25 MG tablet Take 1 tablet (6.25 mg total) by mouth 2 (two) times daily with a meal.  60 tablet  5  . colchicine 0.6 MG tablet 2 tabs by mouth at start of gout flare.  Then one tablet 1 hour later.  6 tablet  5  . folic acid (FOLVITE) 1 MG tablet Take 1 tablet (1 mg total) by mouth daily.  30 tablet  5  . furosemide (LASIX) 20 MG tablet Take 1 tablet (20 mg total) by mouth daily.  30 tablet  5  . lisinopril-hydrochlorothiazide (PRINZIDE,ZESTORETIC) 20-25 MG per tablet TAKE 1 TABLET BY MOUTH ONCE DAILY  30 tablet  6  . lovastatin (MEVACOR) 40 MG tablet  Take 2 tablets by mouth at bedtime  60 tablet  6  . Omega-3 Fatty Acids (FISH OIL) 1000 MG CAPS Take 2 capsules (2,000 mg total) by mouth 2 (two) times daily.    0    BP 120/68  Pulse 75  Temp 98 F (36.7 C) (Oral)  Resp 16  Ht 5\' 7"  (1.702 m)  Wt 229 lb 1.9 oz (103.928 kg)  BMI 35.89 kg/m2  SpO2 97%       Objective:   Physical Exam  Constitutional: He is oriented to person, place, and time. He appears well-developed and well-nourished. No distress.  HENT:  Head: Normocephalic and atraumatic.  Cardiovascular: Normal rate and regular rhythm.   Murmur heard. Pulmonary/Chest: Effort normal. No respiratory distress. He has no wheezes. He has no rales. He exhibits no tenderness.  Neurological: He is alert and oriented to person, place, and time.  Psychiatric: He has a normal mood and affect. His behavior is normal. Judgment and thought content normal.          Assessment & Plan:

## 2012-03-23 NOTE — Assessment & Plan Note (Signed)
Clinically stable. Last A1C was at goal at 6.8.He will return in 1 week for follow up A1C as it is too soon to draw today. Continue diabetic diet.

## 2012-03-23 NOTE — Assessment & Plan Note (Signed)
LDL at goal on mevacor.  Monitor.

## 2012-03-23 NOTE — Assessment & Plan Note (Signed)
Plan to repeat bmet when he returns.  Monitor.

## 2012-03-23 NOTE — Assessment & Plan Note (Signed)
Clinically euvolemic. Pt to follow up with cardiology as scheduled.

## 2012-03-28 ENCOUNTER — Telehealth: Payer: Self-pay | Admitting: *Deleted

## 2012-03-28 DIAGNOSIS — E782 Mixed hyperlipidemia: Secondary | ICD-10-CM | POA: Diagnosis not present

## 2012-03-28 DIAGNOSIS — E119 Type 2 diabetes mellitus without complications: Secondary | ICD-10-CM

## 2012-03-28 DIAGNOSIS — I10 Essential (primary) hypertension: Secondary | ICD-10-CM

## 2012-03-28 LAB — HEPATIC FUNCTION PANEL
ALT: 14 U/L (ref 0–53)
Bilirubin, Direct: 0.1 mg/dL (ref 0.0–0.3)
Total Bilirubin: 0.6 mg/dL (ref 0.3–1.2)

## 2012-03-28 LAB — LIPID PANEL
Cholesterol: 138 mg/dL (ref 0–200)
LDL Cholesterol: 46 mg/dL (ref 0–99)
Total CHOL/HDL Ratio: 3.6 Ratio
VLDL: 54 mg/dL — ABNORMAL HIGH (ref 0–40)

## 2012-03-28 LAB — BASIC METABOLIC PANEL
BUN: 34 mg/dL — ABNORMAL HIGH (ref 6–23)
CO2: 28 mEq/L (ref 19–32)
Chloride: 104 mEq/L (ref 96–112)
Creat: 1.68 mg/dL — ABNORMAL HIGH (ref 0.50–1.35)
Glucose, Bld: 159 mg/dL — ABNORMAL HIGH (ref 70–99)

## 2012-03-28 NOTE — Telephone Encounter (Signed)
Pt presented to the lab. Orders entered. 

## 2012-03-28 NOTE — Telephone Encounter (Signed)
Message copied by Kathi Simpers on Mon Mar 28, 2012  8:15 AM ------      Message from: O'SULLIVAN, MELISSA      Created: Mon Mar 21, 2012  8:25 AM       Pt will return for the following after 12/10            a1c (250.0)      FLP/LFT (hyperlipidemia)      bmet (HTN)

## 2012-04-01 ENCOUNTER — Encounter: Payer: Self-pay | Admitting: Family

## 2012-05-26 ENCOUNTER — Other Ambulatory Visit: Payer: Self-pay | Admitting: Family

## 2012-06-20 ENCOUNTER — Encounter: Payer: Self-pay | Admitting: Family

## 2012-06-20 ENCOUNTER — Ambulatory Visit (INDEPENDENT_AMBULATORY_CARE_PROVIDER_SITE_OTHER): Payer: Medicare Other | Admitting: Family

## 2012-06-20 ENCOUNTER — Ambulatory Visit: Payer: Medicare Other | Admitting: Family

## 2012-06-20 VITALS — BP 122/70 | HR 71 | Temp 97.6°F | Resp 16 | Ht 67.0 in | Wt 232.1 lb

## 2012-06-20 DIAGNOSIS — I509 Heart failure, unspecified: Secondary | ICD-10-CM | POA: Diagnosis not present

## 2012-06-20 DIAGNOSIS — E119 Type 2 diabetes mellitus without complications: Secondary | ICD-10-CM | POA: Diagnosis not present

## 2012-06-20 DIAGNOSIS — I10 Essential (primary) hypertension: Secondary | ICD-10-CM | POA: Diagnosis not present

## 2012-06-20 NOTE — Progress Notes (Signed)
Subjective:    Patient ID: Javier [name], male    DOB: 01-Jan-1930, 79 y.o.   MRN: [id number]  HPI  DM2- Last A1C 6.9 (12/9) He continues diabetic diet. Reports that sugar generally around 100 at him.    CHF- He reports mild sob with walking.  At baseline.  Notes mild LE edema.   HTN- continues lisinopril-hctz, amlodipine, carvedilol  Hyperlipidemia- continues statin without myalgia.  Last LDL 46.     Review of Systems See HPI  Past Medical History  Diagnosis Date  . CAD (coronary artery disease)   . Congestive heart failure   . Atrial fibrillation   . Hypertension   . Hyperlipidemia   . Leg cramps   . Bronchitis   . Shingles   . Chronic renal insufficiency   . Osteoarthritis   . Gout   . GERD (gastroesophageal reflux disease)   . Diabetes mellitus type II     History   Social History  . Marital Status: Legally Separated    Spouse Name: N/A    Number of Children: N/A  . Years of Education: N/A   Occupational History  . Not on file.   Social History Main Topics  . Smoking status: Former Games developer  . Smokeless tobacco: Not on file     Comment: quit 40 years ago-40 pack year history  . Alcohol Use: Not on file  . Drug Use: Not on file  . Sexually Active: Not on file   Other Topics Concern  . 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 Mitral valve repair  04/2005    s/p mitral valve repair  . Cardioversion  11/02/2005    s/p  . Coronary artery bypass graft  05/18/2005    Family History  Problem Relation Age of Onset  . Diabetes      siblings    No Known Allergies  Current Outpatient Prescriptions on File Prior to Visit  Medication Sig Dispense Refill  . allopurinol (ZYLOPRIM) 100 MG tablet Take 1  tablet (100 mg total) by mouth daily.  30 tablet  5  . amLODipine (NORVASC) 10 MG tablet Take 1 tablet (10 mg total) by mouth daily.  30 tablet  5  . aspirin 81 MG tablet Take 81 mg by mouth daily.        . carvedilol (COREG) 6.25 MG tablet Take 1 tablet (6.25 mg total) by mouth 2 (two) times daily with a meal.  60 tablet  5  . colchicine 0.6 MG tablet 2 tabs by mouth at start of gout flare.  Then one tablet 1 hour later.  6 tablet  5  . folic acid (FOLVITE) 1 MG tablet Take 1 tablet (1 mg total) by mouth daily.  30 tablet  5  . furosemide (LASIX) 20 MG tablet Take 1 tablet (20 mg total) by mouth daily.  30 tablet  5  . lisinopril-hydrochlorothiazide (PRINZIDE,ZESTORETIC) 20-25 MG per tablet TAKE 1 TABLET BY MOUTH ONCE DAILY  30 tablet  3  . lovastatin (MEVACOR) 40 MG tablet Take 2 tablets by mouth at bedtime  60 tablet  6  . Omega-3 Fatty Acids (FISH OIL) 1000 MG CAPS Take  2 capsules (2,000 mg total) by mouth 2 (two) times daily.    0   No current facility-administered medications on file prior to visit.    BP 122/70  Pulse 71  Temp(Src) 97.6 F (36.4 C) (Oral)  Resp 16  Ht 5\' 7"  (1.702 m)  Wt 232 lb 1.3 oz (105.271 kg)  BMI 36.34 kg/m2  SpO2 97%       Objective:   Physical Exam  Constitutional: He is oriented to person, place, and time. He appears well-developed and well-nourished. No distress.  HENT:  Head: Normocephalic and atraumatic.  Cardiovascular: Normal rate and regular rhythm.   No murmur heard. Pulmonary/Chest: Effort normal and breath sounds normal. No respiratory distress. He has no wheezes. He has no rales.  Musculoskeletal:  2-3+ bilateral LE edema  Lymphadenopathy:    He has no cervical adenopathy.  Neurological: He is alert and oriented to person, place, and time.  Skin: Skin is warm and dry.  Psychiatric: He has a normal mood and affect. His behavior is normal. Judgment and thought content normal.          Assessment & Plan:

## 2012-06-20 NOTE — Assessment & Plan Note (Signed)
Clinically stable. He will return after 3/9 for A1C.

## 2012-06-20 NOTE — Assessment & Plan Note (Signed)
Clinically stable. Follows with cardiology (Dr. Eden Emms).

## 2012-06-20 NOTE — Assessment & Plan Note (Signed)
BP Readings from Last 3 Encounters:  06/20/12 122/70  03/21/12 120/68  12/28/11 116/70   BP is stable. Continue current meds.

## 2012-06-20 NOTE — Patient Instructions (Addendum)
Please complete your lab work after 06/26/12. Follow up in 3 months.

## 2012-07-07 ENCOUNTER — Other Ambulatory Visit: Payer: Self-pay | Admitting: Family

## 2012-07-08 NOTE — Telephone Encounter (Signed)
Rx request to pharmacy/SLS  

## 2012-10-15 ENCOUNTER — Other Ambulatory Visit: Payer: Self-pay | Admitting: Family

## 2012-10-17 NOTE — Telephone Encounter (Signed)
Rx request to pharmacy; *PATIENT DUE FOR FOLLOW-UP OFFICE VISIT*/SLS    

## 2012-10-27 ENCOUNTER — Other Ambulatory Visit: Payer: Self-pay

## 2012-12-20 ENCOUNTER — Other Ambulatory Visit: Payer: Self-pay | Admitting: Family

## 2013-01-25 ENCOUNTER — Other Ambulatory Visit: Payer: Self-pay | Admitting: Family

## 2013-01-25 NOTE — Telephone Encounter (Signed)
Rx request to pharmacy, 30-day supply;*PATIENT DUE FOR FOLLOW-UP OFFICE VISIT*/SLS  

## 2013-02-06 ENCOUNTER — Ambulatory Visit (INDEPENDENT_AMBULATORY_CARE_PROVIDER_SITE_OTHER): Payer: Medicare Other | Admitting: Family

## 2013-02-06 ENCOUNTER — Encounter: Payer: Self-pay | Admitting: Family

## 2013-02-06 VITALS — BP 148/84 | HR 69 | Resp 16 | Ht 67.0 in | Wt 226.1 lb

## 2013-02-06 DIAGNOSIS — L989 Disorder of the skin and subcutaneous tissue, unspecified: Secondary | ICD-10-CM

## 2013-02-06 DIAGNOSIS — R011 Cardiac murmur, unspecified: Secondary | ICD-10-CM

## 2013-02-06 DIAGNOSIS — E785 Hyperlipidemia, unspecified: Secondary | ICD-10-CM

## 2013-02-06 DIAGNOSIS — Z23 Encounter for immunization: Secondary | ICD-10-CM

## 2013-02-06 DIAGNOSIS — I10 Essential (primary) hypertension: Secondary | ICD-10-CM

## 2013-02-06 DIAGNOSIS — E119 Type 2 diabetes mellitus without complications: Secondary | ICD-10-CM

## 2013-02-06 DIAGNOSIS — I4891 Unspecified atrial fibrillation: Secondary | ICD-10-CM

## 2013-02-06 DIAGNOSIS — E782 Mixed hyperlipidemia: Secondary | ICD-10-CM

## 2013-02-06 LAB — HEMOGLOBIN A1C: Mean Plasma Glucose: 169 mg/dL — ABNORMAL HIGH (ref <117)

## 2013-02-06 MED ORDER — FOLIC ACID 1 MG PO TABS: ORAL_TABLET | ORAL | Status: DC

## 2013-02-06 MED ORDER — CARVEDILOL 6.25 MG PO TABS: ORAL_TABLET | ORAL | Status: DC

## 2013-02-06 MED ORDER — AMLODIPINE BESYLATE 10 MG PO TABS: ORAL_TABLET | ORAL | Status: DC

## 2013-02-06 MED ORDER — LISINOPRIL-HYDROCHLOROTHIAZIDE 20-25 MG PO TABS: ORAL_TABLET | ORAL | Status: DC

## 2013-02-06 MED ORDER — ALLOPURINOL 100 MG PO TABS: ORAL_TABLET | ORAL | Status: DC

## 2013-02-06 MED ORDER — FUROSEMIDE 20 MG PO TABS: ORAL_TABLET | ORAL | Status: DC

## 2013-02-06 NOTE — Assessment & Plan Note (Signed)
Rate stable.  Follows with cardiology.

## 2013-02-06 NOTE — Progress Notes (Signed)
Subjective:    Patient ID: DIOR STEPTER, male    DOB: 27-Jun-1929, 79 y.o.   MRN: [id number]  HPI Mr. Funari is an 79 year old male who presents today for follow up and medication refills.  1) HTN- denies headaches or chest pain. Patient reports he eats whatever he wants. Patient reports compliance with his lisinopril/hctz, carvedilol, and amloiopine. Patient follows Dr. Eden Emms with Cardiology.  2) Diabetes- patient checks his BS at home, reports they are 100 to 120. Denies polyuria, polydipsia, and polyphagia.   3) Immunizations- flu shot due, pneumonia 5 years ago.  4) Skin lesion left foot- pt reports present x 40 yrs and unchanged.    5) Hyperlipidemia- continues statin.  Non-compliant with dietary recommendations.    Review of Systems  Constitutional: Negative for activity change.  HENT: Negative for congestion and sore throat.   Respiratory: Negative for cough and shortness of breath.   Cardiovascular: Negative for chest pain.       Patient reports occasional mild swelling to bilateral ankles  Genitourinary: Negative for dysuria and frequency.  Neurological: Negative for dizziness and numbness.   Past Medical History  Diagnosis Date  . CAD (coronary artery disease)   . Congestive heart failure   . Atrial fibrillation   . Hypertension   . Hyperlipidemia   . Leg cramps   . Bronchitis   . Shingles   . Chronic renal insufficiency   . Osteoarthritis   . Gout   . GERD (gastroesophageal reflux disease)   . Diabetes mellitus type II     History   Social History  . Marital Status: Legally Separated    Spouse Name: N/A    Number of Children: N/A  . Years of Education: N/A   Occupational History  . Not on file.   Social History Main Topics  . Smoking status: Former Games developer  . Smokeless tobacco: Not on file     Comment: quit 40 years ago-40 pack year history  . Alcohol Use: Not on file  . Drug Use: Not on file  . Sexual Activity: Not on file   Other Topics  Concern  . 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 Mitral valve repair  04/2005    s/p mitral valve repair  . Cardioversion  11/02/2005    s/p  . Coronary artery bypass graft  05/18/2005    Family History  Problem Relation Age of Onset  . Diabetes      siblings    No Known Allergies  Current Outpatient Prescriptions on File Prior to Visit  Medication Sig Dispense Refill  . aspirin 81 MG tablet Take 81 mg by mouth daily.        . colchicine 0.6 MG tablet 2 tabs by mouth at start of gout flare.  Then one tablet 1 hour later.  6 tablet  5  . lovastatin (MEVACOR) 40 MG tablet Take 1 tablet (40 mg total) by mouth at bedtime.  60 tablet  5  . Omega-3 Fatty Acids (FISH OIL) 1000 MG CAPS Take 2 capsules (2,000 mg total) by mouth 2 (two) times daily.    0   No current facility-administered medications  on file prior to visit.    BP 148/84  Pulse 69  Resp 16  Ht 5\' 7"  (1.702 m)  Wt 226 lb 1.3 oz (102.549 kg)  BMI 35.4 kg/m2  SpO2 96%        Objective:   Physical Exam  Constitutional: He is oriented to person, place, and time. He appears well-nourished.  HENT:  Head: Normocephalic.  Neck: Neck supple.  Cardiovascular: Intact distal pulses.  An irregular rhythm present.  Pulses:      Dorsalis pedis pulses are 2+ on the right side.       Posterior tibial pulses are 2+ on the right side.  Soft mid systolic murmur noted overlying aortic region  Pulmonary/Chest: Effort normal and breath sounds normal. No respiratory distress.  Musculoskeletal: He exhibits no edema.  Lymphadenopathy:    He has no cervical adenopathy.  Neurological: He is alert and oriented to person, place, and time.  Skin: Skin is warm and dry.  approx 1.5  cm thickened, brown skin lesion clinically consistent with seborrheic keratosis present to left dorsal foot. Thickened raised keratosis   Psychiatric: He has a normal mood and affect. His behavior is normal. Judgment and thought content normal.          Assessment & Plan:  Plan prevnar next visit.

## 2013-02-06 NOTE — Patient Instructions (Signed)
Please complete lab work prior to leaving. Follow up in 3 months.  

## 2013-02-06 NOTE — Assessment & Plan Note (Signed)
Will obtain 2D echo. 

## 2013-02-06 NOTE — Assessment & Plan Note (Signed)
Appears to be stable benign seborrheic keratosis.

## 2013-02-06 NOTE — Assessment & Plan Note (Signed)
On statin, fasting today. Obtain FLP/LFT, continue statin.

## 2013-02-06 NOTE — Assessment & Plan Note (Addendum)
BP today 148/80. Continue medications. Obtaining labs today.  Follow up in 3 months.

## 2013-02-06 NOTE — Assessment & Plan Note (Addendum)
Reports BS at home are running in the low 100's.  Diabetic foot exam negative.  Flu shot today.  Obtain labs today such as urine microalbumin, BMP, HgA1C.  Follow up in 3 months.

## 2013-02-07 ENCOUNTER — Telehealth: Payer: Self-pay | Admitting: Family

## 2013-02-07 LAB — BASIC METABOLIC PANEL WITH GFR
CO2: 24 mEq/L (ref 19–32)
Calcium: 9.6 mg/dL (ref 8.4–10.5)
Creat: 1.45 mg/dL — ABNORMAL HIGH (ref 0.50–1.35)
GFR, Est African American: 52 mL/min — ABNORMAL LOW
Glucose, Bld: 133 mg/dL — ABNORMAL HIGH (ref 70–99)
Sodium: 137 mEq/L (ref 135–145)

## 2013-02-07 LAB — HEPATIC FUNCTION PANEL
Albumin: 4.6 g/dL (ref 3.5–5.2)
Alkaline Phosphatase: 54 U/L (ref 39–117)
Bilirubin, Direct: 0.1 mg/dL (ref 0.0–0.3)
Indirect Bilirubin: 0.6 mg/dL (ref 0.0–0.9)
Total Protein: 7.7 g/dL (ref 6.0–8.3)

## 2013-02-07 LAB — MICROALBUMIN / CREATININE URINE RATIO
Creatinine, Urine: 61.4 mg/dL
Microalb Creat Ratio: 54.6 mg/g — ABNORMAL HIGH (ref 0.0–30.0)

## 2013-02-07 LAB — LIPID PANEL
HDL: 35 mg/dL — ABNORMAL LOW (ref >39)
LDL Cholesterol: 56 mg/dL (ref 0–99)
Triglycerides: 186 mg/dL — ABNORMAL HIGH (ref <150)
VLDL: 37 mg/dL (ref 0–40)

## 2013-02-07 MED ORDER — SITAGLIPTIN PHOSPHATE 50 MG PO TABS: 50.0000 mg | ORAL_TABLET | Freq: Every day | ORAL | Status: DC

## 2013-02-07 NOTE — Telephone Encounter (Signed)
Attempted to reach pt.  "Voice mailbox has not been set up yet".  Will try again.

## 2013-02-07 NOTE — Telephone Encounter (Signed)
Sugar control is worse.  Please work hard on diet, exercise, weight loss. Add Januvia 50mg  once daily.

## 2013-02-08 NOTE — Telephone Encounter (Signed)
Attempted to reach pt and received message that voice mailbox has not been set up yet. Mailed letter to pt.

## 2013-02-15 ENCOUNTER — Ambulatory Visit (HOSPITAL_BASED_OUTPATIENT_CLINIC_OR_DEPARTMENT_OTHER)
Admission: RE | Admit: 2013-02-15 | Discharge: 2013-02-15 | Disposition: A | Discharge status: Home / Self Care | Payer: Medicare Other | Source: Ambulatory Visit | Attending: Family Medicine | Admitting: Family Medicine

## 2013-02-15 ENCOUNTER — Encounter: Payer: Self-pay | Admitting: Family

## 2013-02-15 ENCOUNTER — Telehealth: Payer: Self-pay | Admitting: *Deleted

## 2013-02-15 DIAGNOSIS — I369 Nonrheumatic tricuspid valve disorder, unspecified: Secondary | ICD-10-CM

## 2013-02-15 DIAGNOSIS — I079 Rheumatic tricuspid valve disease, unspecified: Secondary | ICD-10-CM | POA: Diagnosis not present

## 2013-02-15 DIAGNOSIS — I059 Rheumatic mitral valve disease, unspecified: Secondary | ICD-10-CM | POA: Diagnosis not present

## 2013-02-15 DIAGNOSIS — I4891 Unspecified atrial fibrillation: Secondary | ICD-10-CM | POA: Diagnosis not present

## 2013-02-15 DIAGNOSIS — I509 Heart failure, unspecified: Secondary | ICD-10-CM

## 2013-02-15 DIAGNOSIS — R011 Cardiac murmur, unspecified: Secondary | ICD-10-CM

## 2013-02-15 NOTE — Telephone Encounter (Signed)
Unable to reach pt as voice mailbox has not been set up. Mailed result letter to pt.

## 2013-02-15 NOTE — Progress Notes (Signed)
Echocardiogram 2D Echocardiogram has been performed.  Wilver, Tignor 02/15/2013, 11:07 AM

## 2013-02-15 NOTE — Telephone Encounter (Signed)
Message copied by Kathi Simpers on Wed Feb 15, 2013  4:32 PM ------      Message from: O'SULLIVAN, MELISSA      Created: Wed Feb 15, 2013  3:58 PM       Please let pt know that heart looks strong. Does not some increased filling pressure (diastolic dysfunction). This is likely related to history of HTN and can cause swelling.  He should continue lasix and call us if worsening shortness of breath. ------

## 2013-03-07 ENCOUNTER — Other Ambulatory Visit: Payer: Self-pay | Admitting: Family

## 2013-03-07 NOTE — Telephone Encounter (Signed)
allopurinol (ZYLOPRIM) 100 MG tablet 30 tablet 5 02/06/2013     Sig: TAKE 1 TABLET BY MOUTH ONCE DAILY.    E-Prescribing Status: Receipt confirmed by pharmacy (02/06/2013 11:16 AM EDT)

## 2013-05-01 ENCOUNTER — Telehealth: Payer: Self-pay | Admitting: Family

## 2013-05-01 ENCOUNTER — Encounter: Payer: Self-pay | Admitting: Family

## 2013-05-01 ENCOUNTER — Ambulatory Visit (INDEPENDENT_AMBULATORY_CARE_PROVIDER_SITE_OTHER): Payer: Medicare Other | Admitting: Family

## 2013-05-01 VITALS — BP 100/70 | HR 68 | Temp 98.3°F | Resp 16 | Ht 67.0 in | Wt 221.0 lb

## 2013-05-01 DIAGNOSIS — Z23 Encounter for immunization: Secondary | ICD-10-CM | POA: Diagnosis not present

## 2013-05-01 DIAGNOSIS — I10 Essential (primary) hypertension: Secondary | ICD-10-CM | POA: Diagnosis not present

## 2013-05-01 DIAGNOSIS — E119 Type 2 diabetes mellitus without complications: Secondary | ICD-10-CM | POA: Diagnosis not present

## 2013-05-01 DIAGNOSIS — M109 Gout, unspecified: Secondary | ICD-10-CM

## 2013-05-01 LAB — BASIC METABOLIC PANEL
BUN: 47 mg/dL — AB (ref 6–23)
CALCIUM: 9.4 mg/dL (ref 8.4–10.5)
CO2: 28 mEq/L (ref 19–32)
Chloride: 106 mEq/L (ref 96–112)
Creat: 1.99 mg/dL — ABNORMAL HIGH (ref 0.50–1.35)
GLUCOSE: 132 mg/dL — AB (ref 70–99)
Potassium: 4.5 mEq/L (ref 3.5–5.3)
Sodium: 139 mEq/L (ref 135–145)

## 2013-05-01 MED ORDER — SITAGLIPTIN PHOSPHATE 50 MG PO TABS: 50.0000 mg | ORAL_TABLET | Freq: Every day | ORAL | Status: DC

## 2013-05-01 MED ORDER — ALLOPURINOL 100 MG PO TABS: ORAL_TABLET | ORAL | Status: DC

## 2013-05-01 MED ORDER — COLCHICINE 0.6 MG PO TABS: ORAL_TABLET | ORAL | Status: DC

## 2013-05-01 NOTE — Assessment & Plan Note (Signed)
Stable. BP today 100/70.  Continue medications. Obtain BMET today.

## 2013-05-01 NOTE — Telephone Encounter (Signed)
Spoke with pt. Also sent refills of allopurinol and colchicine.

## 2013-05-01 NOTE — Assessment & Plan Note (Signed)
Discussed continuation of health diet and exercise, instructed patient to check blood sugar once daily. Continue Januvia, obtain BMET today. Follow up in three months.

## 2013-05-01 NOTE — Progress Notes (Signed)
   Subjective:    Patient ID: Javier [name], male    DOB: 1929-05-20, 80 y.o.   MRN: [id number]  Diabetes Pertinent negatives for hypoglycemia include no headaches. Pertinent negatives for diabetes include no chest pain and no weakness.  Hypertension Pertinent negatives include no chest pain, headaches or shortness of breath.   Javier [name] is an 80 year old male who presents today for follow up.  HTN- Compliant with medications.  BP today 100/70.  Denies chest pain, dizziness, nausea/vomiting.  BP Readings from Last 3 Encounters:  02/06/13 148/84  06/20/12 122/70  03/21/12 120/68    Diabetes- Reports blood sugar running about 110, patient checking sugar once weekly.  Patient reports he cooks a lot and eats fresh fruits and vegetables.  Patient reports regular exercise with walking.  Eye exam scheduled this month.  Wt Readings from Last 3 Encounters:  05/01/13 221 lb 0.6 oz (100.263 kg)  02/06/13 226 lb 1.3 oz (102.549 kg)  06/20/12 232 lb 1.3 oz (105.271 kg)    Hyperlipidemia- Reports compliance to statin, denies myalgias.   Gout- Patient reports eating shrimp this weekend and reports left great toe is painful and inflammed. Patient taking colchicine and allopurinol.   Review of Systems  Constitutional: Negative for chills.  HENT: Positive for congestion. Negative for rhinorrhea.        Reports getting over cold  Respiratory: Negative for cough and shortness of breath.   Cardiovascular: Negative for chest pain.  Gastrointestinal: Negative for nausea and abdominal pain.  Genitourinary: Negative for difficulty urinating.  Musculoskeletal: Negative for myalgias.  Skin: Negative for rash.  Neurological: Negative for weakness and headaches.  Psychiatric/Behavioral: Negative for sleep disturbance.       Objective:   Physical Exam  Constitutional: He is oriented to person, place, and time. He appears well-nourished.  HENT:  Head: Normocephalic.  Mouth/Throat: Oropharynx is  clear and moist.  Eyes: Pupils are equal, round, and reactive to light.  Neck: Neck supple.  Cardiovascular: Normal rate and regular rhythm.   Pulmonary/Chest: Breath sounds normal. No respiratory distress.  Musculoskeletal: He exhibits no edema.  Lymphadenopathy:    He has no cervical adenopathy.  Neurological: He is alert and oriented to person, place, and time.  Skin: Skin is warm and dry.  Mole present to left dorsal foot, patient reports this has been there for 20 years.  Psychiatric: His behavior is normal.          Assessment & Plan:

## 2013-05-01 NOTE — Telephone Encounter (Signed)
Patient states that the pharmacy never received rx from visit today, please resend.

## 2013-05-01 NOTE — Patient Instructions (Signed)
Complete lab work prior to leaving. Keep upcoming eye exam. Check blood sugars once daily. Schedule follow up in 3 months.

## 2013-05-01 NOTE — Assessment & Plan Note (Signed)
Current flare-up, patient reported eating shrimp this weekend. Symptoms are resolving with medication. Continue taking allopurinol and colchicine.

## 2013-05-01 NOTE — Progress Notes (Signed)
Pre visit review using our clinic review tool, if applicable. No additional management support is needed unless otherwise documented below in the visit note. 

## 2013-05-02 ENCOUNTER — Telehealth: Payer: Self-pay

## 2013-05-02 NOTE — Telephone Encounter (Signed)
Relevant patient education mailed to patient.  

## 2013-05-23 ENCOUNTER — Telehealth: Payer: Self-pay | Admitting: Family

## 2013-05-23 NOTE — Telephone Encounter (Signed)
Relevant patient education mailed to patient.  

## 2013-08-04 ENCOUNTER — Other Ambulatory Visit: Payer: Self-pay | Admitting: Family

## 2013-08-04 NOTE — Telephone Encounter (Signed)
Rx request to pharmacy/SLS  

## 2013-08-08 ENCOUNTER — Telehealth: Payer: Self-pay | Admitting: *Deleted

## 2013-08-08 MED ORDER — SITAGLIPTIN PHOSPHATE 50 MG PO TABS: 50.0000 mg | ORAL_TABLET | Freq: Every day | ORAL | Status: DC

## 2013-08-08 NOTE — Telephone Encounter (Signed)
Received message from pt's family member that he needs refill of januvia. Sent 30 day supply to the pharmacy.  Pt is due for a follow up now.  Please call pt to arrange appt.

## 2013-08-09 NOTE — Telephone Encounter (Signed)
Informed patient of medication refill and he scheduled appointment for 08/23/13

## 2013-08-23 ENCOUNTER — Ambulatory Visit (INDEPENDENT_AMBULATORY_CARE_PROVIDER_SITE_OTHER): Payer: Medicare Other | Admitting: Family

## 2013-08-23 ENCOUNTER — Encounter: Payer: Self-pay | Admitting: Family

## 2013-08-23 VITALS — BP 116/82 | HR 75 | Temp 97.5°F | Resp 18 | Ht 67.0 in | Wt 214.1 lb

## 2013-08-23 DIAGNOSIS — E785 Hyperlipidemia, unspecified: Secondary | ICD-10-CM

## 2013-08-23 DIAGNOSIS — I10 Essential (primary) hypertension: Secondary | ICD-10-CM

## 2013-08-23 DIAGNOSIS — E119 Type 2 diabetes mellitus without complications: Secondary | ICD-10-CM | POA: Diagnosis not present

## 2013-08-23 DIAGNOSIS — R0989 Other specified symptoms and signs involving the circulatory and respiratory systems: Secondary | ICD-10-CM | POA: Insufficient documentation

## 2013-08-23 DIAGNOSIS — M109 Gout, unspecified: Secondary | ICD-10-CM

## 2013-08-23 DIAGNOSIS — E782 Mixed hyperlipidemia: Secondary | ICD-10-CM

## 2013-08-23 LAB — HEPATIC FUNCTION PANEL
ALBUMIN: 4.8 g/dL (ref 3.5–5.2)
ALK PHOS: 53 U/L (ref 39–117)
ALT: 11 U/L (ref 0–53)
AST: 15 U/L (ref 0–37)
BILIRUBIN INDIRECT: 0.6 mg/dL (ref 0.2–1.2)
BILIRUBIN TOTAL: 0.7 mg/dL (ref 0.2–1.2)
Bilirubin, Direct: 0.1 mg/dL (ref 0.0–0.3)
Total Protein: 7.9 g/dL (ref 6.0–8.3)

## 2013-08-23 LAB — BASIC METABOLIC PANEL
BUN: 43 mg/dL — ABNORMAL HIGH (ref 6–23)
CALCIUM: 10.3 mg/dL (ref 8.4–10.5)
CO2: 30 mEq/L (ref 19–32)
CREATININE: 1.87 mg/dL — AB (ref 0.50–1.35)
Chloride: 100 mEq/L (ref 96–112)
GLUCOSE: 129 mg/dL — AB (ref 70–99)
Potassium: 4.9 mEq/L (ref 3.5–5.3)
Sodium: 139 mEq/L (ref 135–145)

## 2013-08-23 LAB — HEMOGLOBIN A1C
Hgb A1c MFr Bld: 6.6 % — ABNORMAL HIGH (ref <5.7)
MEAN PLASMA GLUCOSE: 143 mg/dL — AB (ref <117)

## 2013-08-23 NOTE — Assessment & Plan Note (Signed)
BP stable on current meds.  Obtain bmet. 

## 2013-08-23 NOTE — Progress Notes (Signed)
Pre visit review using our clinic review tool, if applicable. No additional management support is needed unless otherwise documented below in the visit note. 

## 2013-08-23 NOTE — Assessment & Plan Note (Signed)
LDL at goal. Discussed dietary changes to help lower trigs.

## 2013-08-23 NOTE — Assessment & Plan Note (Signed)
See DM foot assessment. Will refer for LE arterial duplex.

## 2013-08-23 NOTE — Assessment & Plan Note (Signed)
Well controlled per pt report.  Continue januvia, obtain A1C.  Pt instructed to schedule appointment for diabetic eye exam.

## 2013-08-23 NOTE — Progress Notes (Signed)
Subjective:    Patient ID: Javier MohsJames F [name], male    DOB: 24-Jun-1929, 80 y.o.   MRN: [id number]  HPI  Javier [name] is an 80 yr old male who presents today for follow up of multiple medical problems.  1) DM2- on januvia.  Reports sugars at home are running <125. He did not see the eye doctor as planned Lab Results  Component Value Date   HGBA1C 7.5* 02/06/2013    2) HTN- Current BP meds include amlodipine, coreg, lisinopril-hctz. BP Readings from Last 3 Encounters:  08/23/13 116/82  05/01/13 100/70  02/06/13 148/84    3) hyperlipidemia- maintained on mevacor.   Lab Results  Component Value Date   CHOL 128 02/06/2013   HDL 35* 02/06/2013   LDLCALC 56 02/06/2013   TRIG 186* 02/06/2013   CHOLHDL 3.7 02/06/2013   4) Gout- maintained on allopurinol and colchicine. Denies recent gout flare.    Review of Systems See HPI  Past Medical History  Diagnosis Date  . CAD (coronary artery disease)   . Congestive heart failure   . Atrial fibrillation   . Hypertension   . Hyperlipidemia   . Leg cramps   . Bronchitis   . Shingles   . Chronic renal insufficiency   . Osteoarthritis   . Gout   . GERD (gastroesophageal reflux disease)   . Diabetes mellitus type II     History   Social History  . Marital Status: Legally Separated    Spouse Name: N/A    Number of Children: N/A  . Years of Education: N/A   Occupational History  . Not on file.   Social History Main Topics  . Smoking status: Former Games developermoker  . Smokeless tobacco: Not on file     Comment: quit 40 years ago-40 pack year history  . Alcohol Use: Not on file  . Drug Use: Not on file  . Sexual Activity: Not on file   Other Topics Concern  . Not on file   Social History Narrative   Last updated: 02/13/2010   Married but separated from wife   Alcohol use-yes   Former Smoker quit 40 yrs ago (40 pack yr history)     Works part time at [employer] [place]    Past Surgical History    Procedure Laterality Date  . Mitral valve repair  04/2005    s/p mitral valve repair  . Cardioversion  11/02/2005    s/p  . Coronary artery bypass graft  05/18/2005    Family History  Problem Relation Age of Onset  . Diabetes      siblings  . Melanoma Brother     died at 1591    No Known Allergies  Current Outpatient Prescriptions on File Prior to Visit  Medication Sig Dispense Refill  . allopurinol (ZYLOPRIM) 100 MG tablet TAKE 1 TABLET BY MOUTH ONCE DAILY.  30 tablet  5  . amLODipine (NORVASC) 10 MG tablet TAKE 1 TABLET BY MOUTH DAILY.  30 tablet  2  . aspirin 81 MG tablet Take 81 mg by mouth daily.        . carvedilol (COREG) 6.25 MG tablet TAKE 1 TABLET BY MOUTH 2 TIMES DAILY WITH MEALS.  60 tablet  2  . colchicine 0.6 MG tablet 2 tabs by mouth at start of gout flare.  Then one tablet 1 hour later.  6 tablet  5  . folic acid (FOLVITE) 1 MG tablet TAKE 1 TABLET  BY MOUTH DAILY.  30 tablet  1  . furosemide (LASIX) 20 MG tablet TAKE 1 TABLET BY MOUTH DAILY.  30 tablet  2  . lisinopril-hydrochlorothiazide (PRINZIDE,ZESTORETIC) 20-25 MG per tablet TAKE 1 TABLET BY MOUTH ONCE DAILY  30 tablet  5  . lovastatin (MEVACOR) 40 MG tablet Take 1 tablet (40 mg total) by mouth at bedtime.  60 tablet  5  . Omega-3 Fatty Acids (FISH OIL) 1000 MG CAPS Take 2 capsules (2,000 mg total) by mouth 2 (two) times daily.    0  . sitaGLIPtin (JANUVIA) 50 MG tablet Take 1 tablet (50 mg total) by mouth daily.  30 tablet  0   No current facility-administered medications on file prior to visit.    BP 116/82  Pulse 75  Temp(Src) 97.5 F (36.4 C) (Axillary)  Resp 18  Ht 5\' 7"  (1.702 m)  Wt 214 lb 1.9 oz (97.124 kg)  BMI 33.53 kg/m2  SpO2 97%       Objective:   Physical Exam  Constitutional: He is oriented to person, place, and time. He appears well-developed and well-nourished. No distress.  Cardiovascular: Normal rate and regular rhythm.   No murmur heard. Pulmonary/Chest: Effort normal and  breath sounds normal. No respiratory distress. He has no wheezes. He has no rales. He exhibits no tenderness.  Lymphadenopathy:    He has no cervical adenopathy.  Neurological: He is alert and oriented to person, place, and time.  Psychiatric: He has a normal mood and affect. His behavior is normal. Judgment and thought content normal.          Assessment & Plan:

## 2013-08-23 NOTE — Patient Instructions (Addendum)
Please schedule visit with your eye doctor.   Complete lab work prior to leaving. You will be contacted about your leg ultrasound. Please schedule a follow up appointment in 3 months.

## 2013-08-23 NOTE — Assessment & Plan Note (Signed)
Stable, continue allopurinol and colchicine.

## 2013-08-25 ENCOUNTER — Encounter: Payer: Self-pay | Admitting: Family

## 2013-08-25 ENCOUNTER — Telehealth: Payer: Self-pay | Admitting: Family

## 2013-08-25 NOTE — Telephone Encounter (Signed)
Opened in error

## 2013-08-28 ENCOUNTER — Ambulatory Visit (HOSPITAL_COMMUNITY)
Admission: RE | Admit: 2013-08-28 | Discharge: 2013-08-28 | Disposition: A | Discharge status: Home / Self Care | Payer: Medicare Other | Source: Ambulatory Visit | Attending: Family | Admitting: Family

## 2013-08-28 DIAGNOSIS — E119 Type 2 diabetes mellitus without complications: Secondary | ICD-10-CM | POA: Diagnosis not present

## 2013-08-28 DIAGNOSIS — E7800 Pure hypercholesterolemia, unspecified: Secondary | ICD-10-CM | POA: Insufficient documentation

## 2013-08-28 DIAGNOSIS — R0989 Other specified symptoms and signs involving the circulatory and respiratory systems: Secondary | ICD-10-CM

## 2013-08-28 DIAGNOSIS — I10 Essential (primary) hypertension: Secondary | ICD-10-CM | POA: Diagnosis not present

## 2013-08-28 NOTE — Progress Notes (Signed)
VASCULAR LAB PRELIMINARY  ARTERIAL  ABI completed:    RIGHT    LEFT    PRESSURE WAVEFORM  PRESSURE WAVEFORM  BRACHIAL 123 triphasic BRACHIAL 120 triphasic  DP   DP    AT 138 biphasic AT 135 biphasic  PT 138 biphasic PT 139 triphasic  PER   PER    GREAT TOE  NA GREAT TOE  NA    RIGHT LEFT  ABI >1.0 >1.0     Smiley HousemanSandra E Adis Sturgill, RVT 08/28/2013, 10:38 AM

## 2013-09-09 ENCOUNTER — Other Ambulatory Visit: Payer: Self-pay | Admitting: Family

## 2013-10-17 ENCOUNTER — Other Ambulatory Visit: Payer: Self-pay | Admitting: Family

## 2013-10-18 ENCOUNTER — Telehealth: Payer: Self-pay | Admitting: Family

## 2013-10-18 MED ORDER — FOLIC ACID 1 MG PO TABS: ORAL_TABLET | ORAL | Status: DC

## 2013-10-18 NOTE — Telephone Encounter (Signed)
Refill sent.

## 2013-10-18 NOTE — Telephone Encounter (Signed)
Refill-folic acid  [facility]

## 2013-11-13 ENCOUNTER — Other Ambulatory Visit: Payer: Self-pay | Admitting: Family

## 2013-11-20 ENCOUNTER — Telehealth: Payer: Self-pay | Admitting: Family

## 2013-11-20 ENCOUNTER — Ambulatory Visit: Payer: Medicare Other | Admitting: Family

## 2013-11-20 NOTE — Telephone Encounter (Signed)
3 month follow up patient did not come

## 2013-11-20 NOTE — Telephone Encounter (Signed)
Appointment scheduled for 11/22/13.

## 2013-11-20 NOTE — Telephone Encounter (Signed)
Please contact pt to reschedule

## 2013-11-22 ENCOUNTER — Encounter: Payer: Self-pay | Admitting: Family

## 2013-11-22 ENCOUNTER — Ambulatory Visit (INDEPENDENT_AMBULATORY_CARE_PROVIDER_SITE_OTHER): Payer: Medicare Other | Admitting: Family

## 2013-11-22 VITALS — BP 118/62 | HR 62 | Temp 97.5°F | Resp 16 | Ht 67.0 in | Wt 213.1 lb

## 2013-11-22 DIAGNOSIS — I25810 Atherosclerosis of coronary artery bypass graft(s) without angina pectoris: Secondary | ICD-10-CM | POA: Diagnosis not present

## 2013-11-22 DIAGNOSIS — E785 Hyperlipidemia, unspecified: Secondary | ICD-10-CM | POA: Diagnosis not present

## 2013-11-22 DIAGNOSIS — I10 Essential (primary) hypertension: Secondary | ICD-10-CM

## 2013-11-22 DIAGNOSIS — E119 Type 2 diabetes mellitus without complications: Secondary | ICD-10-CM

## 2013-11-22 DIAGNOSIS — E669 Obesity, unspecified: Secondary | ICD-10-CM

## 2013-11-22 DIAGNOSIS — Z23 Encounter for immunization: Secondary | ICD-10-CM | POA: Diagnosis not present

## 2013-11-22 DIAGNOSIS — E782 Mixed hyperlipidemia: Secondary | ICD-10-CM

## 2013-11-22 DIAGNOSIS — I2510 Atherosclerotic heart disease of native coronary artery without angina pectoris: Secondary | ICD-10-CM

## 2013-11-22 LAB — BASIC METABOLIC PANEL
BUN: 39 mg/dL — ABNORMAL HIGH (ref 6–23)
CALCIUM: 9.6 mg/dL (ref 8.4–10.5)
CO2: 27 mEq/L (ref 19–32)
Chloride: 104 mEq/L (ref 96–112)
Creat: 1.76 mg/dL — ABNORMAL HIGH (ref 0.50–1.35)
GLUCOSE: 119 mg/dL — AB (ref 70–99)
Potassium: 4.3 mEq/L (ref 3.5–5.3)
Sodium: 140 mEq/L (ref 135–145)

## 2013-11-22 LAB — LIPID PANEL
CHOLESTEROL: 127 mg/dL (ref 0–200)
HDL: 37 mg/dL — ABNORMAL LOW (ref >39)
LDL Cholesterol: 49 mg/dL (ref 0–99)
TRIGLYCERIDES: 207 mg/dL — AB (ref <150)
Total CHOL/HDL Ratio: 3.4 Ratio
VLDL: 41 mg/dL — AB (ref 0–40)

## 2013-11-22 LAB — HEMOGLOBIN A1C
Hgb A1c MFr Bld: 6.2 % — ABNORMAL HIGH (ref <5.7)
Mean Plasma Glucose: 131 mg/dL — ABNORMAL HIGH (ref <117)

## 2013-11-22 NOTE — Assessment & Plan Note (Signed)
Clinically stable. Obtain A1C, refer for diabetic eye exam.

## 2013-11-22 NOTE — Progress Notes (Signed)
Subjective:    Patient ID: Javier [name], male    DOB: 1929/10/22, 80 y.o.   MRN: [id number]  HPI  Javier [name] is an 80 yr old male who presents today for follow up of multiple medical problems:  1) HTN-  Maintained on amlodipine, coreg.  BP Readings from Last 3 Encounters:  11/22/13 118/62  08/23/13 116/82  05/01/13 100/70   2) Hyperlipidemia- here for fasting 3 month follow up.  He is maintained on mevacor.  3) DM2- Body mass index is 33.37 kg/(m^2). reports that when he checks his sugars at home they are in the low 100 range.   Wt Readings from Last 3 Encounters:  11/22/13 213 lb 1.3 oz (96.652 kg)  08/23/13 214 lb 1.9 oz (97.124 kg)  05/01/13 221 lb 0.6 oz (100.263 kg)     Lab Results  Component Value Date   HGBA1C 6.6* 08/23/2013   HGBA1C 7.5* 02/06/2013   HGBA1C 6.9* 03/28/2012   Lab Results  Component Value Date   MICROALBUR 3.35* 02/06/2013   LDLCALC 56 02/06/2013   CREATININE 1.87* 08/23/2013   4) CAD/hx AF- pt is s/p MVR and 2 vessel bypass 10/06.  has followed with Dr. Eden [name] in the past.  He is maintained on ASA 81mg . Denies CP or SOB.      Review of Systems    see HPI  Past Medical History  Diagnosis Date  . CAD (coronary artery disease)   . Congestive heart failure   . Atrial fibrillation   . Hypertension   . Hyperlipidemia   . Leg cramps   . Bronchitis   . Shingles   . Chronic renal insufficiency   . Osteoarthritis   . Gout   . GERD (gastroesophageal reflux disease)   . Diabetes mellitus type II     History   Social History  . Marital Status: Legally Separated    Spouse Name: N/A    Number of Children: N/A  . Years of Education: N/A   Occupational History  . Not on file.   Social History Main Topics  . Smoking status: Former Games developer  . Smokeless tobacco: Not on file     Comment: quit 40 years ago-40 pack year history  . Alcohol Use: Not on file  . Drug Use: Not on file  . Sexual Activity: Not on file   Other Topics Concern    . 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 Mitral valve repair  04/2005    s/p mitral valve repair  . Cardioversion  11/02/2005    s/p  . Coronary artery bypass graft  05/18/2005    Family History  Problem Relation Age of Onset  . Diabetes      siblings  . Melanoma Brother     died at 33    No Known Allergies  Current Outpatient Prescriptions on File Prior to Visit  Medication Sig Dispense Refill  . allopurinol (ZYLOPRIM) 100 MG tablet TAKE 1 TABLET BY MOUTH DAILY.  30 tablet  2  . amLODipine (NORVASC) 10 MG tablet TAKE 1 TABLET BY MOUTH DAILY.  30 tablet  3  . aspirin 81 MG tablet Take 81  mg by mouth daily.        . carvedilol (COREG) 6.25 MG tablet TAKE 1 TABLET BY MOUTH 2 TIMES DAILY WITH MEALS.  60 tablet  3  . colchicine 0.6 MG tablet 2 tabs by mouth at start of gout flare.  Then one tablet 1 hour later.  6 tablet  5  . folic acid (FOLVITE) 1 MG tablet TAKE 1 TABLET BY MOUTH DAILY.  30 tablet  3  . furosemide (LASIX) 20 MG tablet TAKE 1 TABLET BY MOUTH DAILY.  30 tablet  3  . JANUVIA 50 MG tablet TAKE 1 TABLET BY MOUTH DAILY.  30 tablet  2  . lisinopril-hydrochlorothiazide (PRINZIDE,ZESTORETIC) 20-25 MG per tablet TAKE 1 TABLET BY MOUTH DAILY.  30 tablet  2  . lovastatin (MEVACOR) 40 MG tablet Take 1 tablet (40 mg total) by mouth at bedtime.  60 tablet  5  . Omega-3 Fatty Acids (FISH OIL) 1000 MG CAPS Take 2 capsules (2,000 mg total) by mouth 2 (two) times daily.    0   No current facility-administered medications on file prior to visit.    BP 118/62  Pulse 62  Temp(Src) 97.5 F (36.4 C) (Oral)  Resp 16  Ht 5\' 7"  (1.702 m)  Wt 213 lb 1.3 oz (96.652 kg)  BMI 33.37 kg/m2  SpO2 97%    Objective:   Physical Exam   Constitutional: He is oriented to person, place, and time. He appears well-developed and well-nourished. No distress.  Cardiovascular: Normal rate and regular rhythm.   No murmur heard. Pulmonary/Chest: Effort normal and breath sounds normal. No respiratory distress. He has no wheezes. He has no rales. He exhibits no tenderness.  Musculoskeletal:  2+ bilateral LE edema  Neurological: He is alert and oriented to person, place, and time.  Psychiatric: He has a normal mood and affect. His behavior is normal. Judgment and thought content normal.          Assessment & Plan:

## 2013-11-22 NOTE — Assessment & Plan Note (Signed)
Clinically stable.  Will refer back to Dr. Eden EmmsNishan for ongoing management of his cardiac issue.

## 2013-11-22 NOTE — Assessment & Plan Note (Signed)
bp stable.  Continue current meds, obtain bmet.

## 2013-11-22 NOTE — Progress Notes (Signed)
Pre visit review using our clinic review tool, if applicable. No additional management support is needed unless otherwise documented below in the visit note. 

## 2013-11-22 NOTE — Assessment & Plan Note (Signed)
Discussed diet, exercise, weight loss.  

## 2013-11-22 NOTE — Patient Instructions (Signed)
Please complete lab work prior to leaving. You will be contacted about your referral to Dr. [facility]an [facility]. Keep working on [employer] diet, exercise, weight loss. Follow up in 3 months.

## 2013-11-22 NOTE — Assessment & Plan Note (Signed)
Tolerating statin, obtain flp 

## 2013-12-08 ENCOUNTER — Other Ambulatory Visit: Payer: Self-pay | Admitting: Family

## 2013-12-19 ENCOUNTER — Other Ambulatory Visit: Payer: Self-pay | Admitting: Family

## 2013-12-26 ENCOUNTER — Ambulatory Visit (INDEPENDENT_AMBULATORY_CARE_PROVIDER_SITE_OTHER): Payer: Medicare Other | Admitting: Cardiovascular Disease

## 2013-12-26 ENCOUNTER — Encounter: Payer: Self-pay | Admitting: Cardiovascular Disease

## 2013-12-26 VITALS — BP 114/66 | HR 67 | Ht 67.0 in | Wt 215.4 lb

## 2013-12-26 DIAGNOSIS — I480 Paroxysmal atrial fibrillation: Secondary | ICD-10-CM

## 2013-12-26 DIAGNOSIS — Z9889 Other specified postprocedural states: Secondary | ICD-10-CM | POA: Diagnosis not present

## 2013-12-26 DIAGNOSIS — I2510 Atherosclerotic heart disease of native coronary artery without angina pectoris: Secondary | ICD-10-CM | POA: Diagnosis not present

## 2013-12-26 DIAGNOSIS — I059 Rheumatic mitral valve disease, unspecified: Secondary | ICD-10-CM

## 2013-12-26 DIAGNOSIS — I4891 Unspecified atrial fibrillation: Secondary | ICD-10-CM

## 2013-12-26 DIAGNOSIS — I10 Essential (primary) hypertension: Secondary | ICD-10-CM

## 2013-12-26 NOTE — Assessment & Plan Note (Signed)
Stable with no angina and good activity level.  Continue medical Rx  

## 2013-12-26 NOTE — Patient Instructions (Signed)

## 2013-12-26 NOTE — Assessment & Plan Note (Signed)
Maint NSR no palpitations off coumadin stable

## 2013-12-26 NOTE — Assessment & Plan Note (Signed)
S/P repair  Residual murmur F/U echo  SBE prophylaxis

## 2013-12-26 NOTE — Assessment & Plan Note (Signed)
Well controlled.  Continue current medications and low sodium Dash type diet.    

## 2013-12-26 NOTE — Progress Notes (Signed)
Patient ID: Javier [name], male   DOB: 1929/08/10, 80 y.o.   MRN: [id number] [name] is seen today for F/U of afib, MV dx and CAD He has a history of mitral valve repair with 2V bypass in October, 2006. This included LIMA to LAD and SVG to D2 He has had PAF. He has hypertension, type 2 diabetes He had been on pacerone in 2007 and had [facility] x1. He was found to be back in afib by Dr Artist Pais on 08/20/09. He was started on coumadinHe had [facility]. No longer on coumadin  He continue to have moderate alcholol intake   Selling produce especially peaches  Weight down a bit    ROS: Denies fever, malais, weight loss, blurry vision, decreased visual acuity, cough, sputum, SOB, hemoptysis, pleuritic pain, palpitaitons, heartburn, abdominal pain, melena, lower extremity edema, claudication, or rash.  All other systems reviewed and negative  General: Affect appropriate Overweight white male  HEENT: normal Neck supple with no adenopathy JVP normal no bruits no thyromegaly Lungs clear with no wheezing and good diaphragmatic motion Heart:  S1/S2 SEM LLSB  murmur, no rub, gallop or click PMI normal Abdomen: benighn, BS positve, no tenderness, no AAA no bruit.  No HSM or HJR Distal pulses intact with no bruits No edema Neuro non-focal Skin warm and dry No muscular weakness   Current Outpatient Prescriptions  Medication Sig Dispense Refill  . allopurinol (ZYLOPRIM) 100 MG tablet TAKE 1 TABLET BY MOUTH DAILY.  30 tablet  2  . amLODipine (NORVASC) 10 MG tablet TAKE 1 TABLET BY MOUTH DAILY.  30 tablet  3  . aspirin 81 MG tablet Take 81 mg by mouth daily.        . carvedilol (COREG) 6.25 MG tablet TAKE 1 TABLET BY MOUTH 2 TIMES DAILY WITH MEALS.  60 tablet  3  . colchicine 0.6 MG tablet 2 tabs by mouth at start of gout flare.  Then one tablet 1 hour later.  6 tablet  5  . folic acid (FOLVITE) 1 MG tablet TAKE 1 TABLET BY MOUTH DAILY.  30 tablet  3  . furosemide (LASIX) 20 MG tablet TAKE 1 TABLET BY  MOUTH DAILY.  30 tablet  3  . JANUVIA 50 MG tablet TAKE 1 TABLET BY MOUTH DAILY  30 tablet  3  . lisinopril-hydrochlorothiazide (PRINZIDE,ZESTORETIC) 20-25 MG per tablet TAKE 1 TABLET BY MOUTH DAILY  30 tablet  3  . lovastatin (MEVACOR) 40 MG tablet TAKE 1 TABLET BY MOUTH AT BEDTIME.  60 tablet  2  . Omega-3 Fatty Acids (FISH OIL) 1000 MG CAPS Take 2 capsules (2,000 mg total) by mouth 2 (two) times daily.    0   No current facility-administered medications for this visit.    Allergies  Review of patient's allergies indicates no known allergies.  Electrocardiogram:  SR rate 67 PAC PR 264    Assessment and Plan

## 2014-01-01 ENCOUNTER — Ambulatory Visit (HOSPITAL_COMMUNITY): Payer: Medicare Other | Attending: Internal Medicine | Admitting: Radiology

## 2014-01-01 DIAGNOSIS — E119 Type 2 diabetes mellitus without complications: Secondary | ICD-10-CM | POA: Insufficient documentation

## 2014-01-01 DIAGNOSIS — N189 Chronic kidney disease, unspecified: Secondary | ICD-10-CM | POA: Diagnosis not present

## 2014-01-01 DIAGNOSIS — Z9889 Other specified postprocedural states: Secondary | ICD-10-CM

## 2014-01-01 DIAGNOSIS — I509 Heart failure, unspecified: Secondary | ICD-10-CM | POA: Insufficient documentation

## 2014-01-01 DIAGNOSIS — I4891 Unspecified atrial fibrillation: Secondary | ICD-10-CM | POA: Insufficient documentation

## 2014-01-01 DIAGNOSIS — G4733 Obstructive sleep apnea (adult) (pediatric): Secondary | ICD-10-CM | POA: Insufficient documentation

## 2014-01-01 DIAGNOSIS — I517 Cardiomegaly: Secondary | ICD-10-CM | POA: Insufficient documentation

## 2014-01-01 DIAGNOSIS — I2510 Atherosclerotic heart disease of native coronary artery without angina pectoris: Secondary | ICD-10-CM | POA: Diagnosis not present

## 2014-01-01 DIAGNOSIS — E785 Hyperlipidemia, unspecified: Secondary | ICD-10-CM | POA: Diagnosis not present

## 2014-01-01 DIAGNOSIS — I129 Hypertensive chronic kidney disease with stage 1 through stage 4 chronic kidney disease, or unspecified chronic kidney disease: Secondary | ICD-10-CM | POA: Insufficient documentation

## 2014-01-01 DIAGNOSIS — I059 Rheumatic mitral valve disease, unspecified: Secondary | ICD-10-CM | POA: Diagnosis not present

## 2014-01-01 NOTE — Progress Notes (Signed)
Echocardiogram performed.  

## 2014-01-08 ENCOUNTER — Telehealth: Payer: Self-pay | Admitting: Cardiovascular Disease

## 2014-01-08 NOTE — Telephone Encounter (Signed)
PT AWARE OF ECHO RESULTS./CY 

## 2014-01-08 NOTE — Telephone Encounter (Signed)
New message  ° ° °Patient calling for test results.   °

## 2014-02-19 ENCOUNTER — Ambulatory Visit (INDEPENDENT_AMBULATORY_CARE_PROVIDER_SITE_OTHER): Payer: Medicare Other | Admitting: *Deleted

## 2014-02-19 ENCOUNTER — Ambulatory Visit (INDEPENDENT_AMBULATORY_CARE_PROVIDER_SITE_OTHER): Payer: Medicare Other | Admitting: Family

## 2014-02-19 ENCOUNTER — Encounter: Payer: Self-pay | Admitting: Family

## 2014-02-19 VITALS — BP 123/64 | HR 78 | Temp 97.6°F | Resp 16 | Ht 67.0 in | Wt 216.4 lb

## 2014-02-19 DIAGNOSIS — Z23 Encounter for immunization: Secondary | ICD-10-CM | POA: Diagnosis not present

## 2014-02-19 DIAGNOSIS — I10 Essential (primary) hypertension: Secondary | ICD-10-CM

## 2014-02-19 DIAGNOSIS — E782 Mixed hyperlipidemia: Secondary | ICD-10-CM | POA: Diagnosis not present

## 2014-02-19 DIAGNOSIS — J209 Acute bronchitis, unspecified: Secondary | ICD-10-CM | POA: Diagnosis not present

## 2014-02-19 DIAGNOSIS — I2510 Atherosclerotic heart disease of native coronary artery without angina pectoris: Secondary | ICD-10-CM | POA: Diagnosis not present

## 2014-02-19 DIAGNOSIS — E119 Type 2 diabetes mellitus without complications: Secondary | ICD-10-CM | POA: Diagnosis not present

## 2014-02-19 MED ORDER — CEFUROXIME AXETIL 500 MG PO TABS: 500.0000 mg | ORAL_TABLET | Freq: Two times a day (BID) | ORAL | Status: DC

## 2014-02-19 NOTE — Assessment & Plan Note (Signed)
Given duration of symptoms and pt's comorbidities (DM) will initiate empiric rx for bronchitis.  (ceftin) Pt is instructed to follow up if symptoms worsen or if symptoms do not improve over the next few days.

## 2014-02-19 NOTE — Assessment & Plan Note (Signed)
Maintained on meds, continue same.  Will obtain A1C, BMET and urine microalbumin in one week when due.

## 2014-02-19 NOTE — Assessment & Plan Note (Addendum)
Maintained on meds, continue same.  Follow up in three months.  Will obtain BMET in one week.

## 2014-02-19 NOTE — Progress Notes (Signed)
Pre visit review using our clinic review tool, if applicable. No additional management support is needed unless otherwise documented below in the visit note. 

## 2014-02-19 NOTE — Patient Instructions (Addendum)
Please follow up in 1 week for lab work. Start ceftin, (antibiotic) for bronchitis. Call if cough worsens or if not improved in 1 week. Follow up in 14 weeks for routine follow up.

## 2014-02-19 NOTE — Assessment & Plan Note (Signed)
Maintained on meds, continue same, lipid panel is up to date.

## 2014-02-19 NOTE — Progress Notes (Signed)
Subjective:    Patient ID: Javier [name], male    DOB: March 17, 1930, [age] y.o.   MRN: [id number]  HPI  Mr. Javier [name] is a 80 year old male who presents today for follow up.   1. HTN- Today's blood pressure is 123/74.  Currently takes carvedilol, lisinopril - hctz, and amlodipine with compliance. BP Readings from Last 3 Encounters:  02/19/14 123/64  12/26/13 114/66  11/22/13 118/62      2. HL- Currently maintained on lovastatin.  Denies myalgia.  3. DM - Takes his blood sugar 1-2 per week.  Last reading was 100 post prandial.  Maintained on Januvia.    4. Cough/Congestion- Began one week ago with dry cough, nasal congestion, and headaches.  Has taken theraflu, alka seltzer and aleve without any relief.  Denies fever. Cough is worse at night when he lays down.    Review of Systems  Constitutional: Negative for fever, chills and diaphoresis.  HENT: Positive for congestion. Negative for ear discharge, ear pain, postnasal drip, rhinorrhea, sinus pressure, sneezing and sore throat.   Eyes: Negative.   Respiratory: Positive for cough. Negative for shortness of breath.   Cardiovascular: Negative for chest pain, palpitations and leg swelling.  Musculoskeletal: Negative.   Skin: Negative for color change, pallor and rash.       Past Medical History  Diagnosis Date  . CAD (coronary artery disease)   . Congestive heart failure   . Atrial fibrillation   . Hypertension   . Hyperlipidemia   . Leg cramps   . Bronchitis   . Shingles   . Chronic renal insufficiency   . Osteoarthritis   . Gout   . GERD (gastroesophageal reflux disease)   . Diabetes mellitus type II     History   Social History  . Marital Status: Legally Separated    Spouse Name: N/A    Number of Children: N/A  . Years of Education: N/A   Occupational History  . Not on file.   Social History Main Topics  . Smoking status: Former Games developermoker  . Smokeless tobacco: Not on file     Comment: quit 40 years ago-40 pack year  history  . Alcohol Use: Not on file  . Drug Use: Not on file  . Sexual Activity: Not on file   Other Topics Concern  . Not on file   Social History Narrative   Last updated: 02/13/2010   Married but separated from wife   Alcohol use-yes   Former Smoker quit 40 yrs ago (40 pack yr history)     Works part time at [employer], [employer] [place] [employer]    Past Surgical History  Procedure Laterality Date  . Mitral valve repair  04/2005    s/p mitral valve repair  . Cardioversion  11/02/2005    s/p  . Coronary artery bypass graft  05/18/2005    Family History  Problem Relation Age of Onset  . Diabetes      siblings  . Melanoma Brother     died at 7191    No Known Allergies  Current Outpatient Prescriptions on File Prior to Visit  Medication Sig Dispense Refill  . allopurinol (ZYLOPRIM) 100 MG tablet TAKE 1 TABLET BY MOUTH DAILY. 30 tablet 2  . amLODipine (NORVASC) 10 MG tablet TAKE 1 TABLET BY MOUTH DAILY. 30 tablet 3  . aspirin 81 MG tablet Take 81 mg by mouth daily.      . carvedilol (COREG) 6.25 MG  tablet TAKE 1 TABLET BY MOUTH 2 TIMES DAILY WITH MEALS. 60 tablet 3  . colchicine 0.6 MG tablet 2 tabs by mouth at start of gout flare.  Then one tablet 1 hour later. 6 tablet 5  . folic acid (FOLVITE) 1 MG tablet TAKE 1 TABLET BY MOUTH DAILY. 30 tablet 3  . furosemide (LASIX) 20 MG tablet TAKE 1 TABLET BY MOUTH DAILY. 30 tablet 3  . JANUVIA 50 MG tablet TAKE 1 TABLET BY MOUTH DAILY 30 tablet 3  . lisinopril-hydrochlorothiazide (PRINZIDE,ZESTORETIC) 20-25 MG per tablet TAKE 1 TABLET BY MOUTH DAILY 30 tablet 3  . lovastatin (MEVACOR) 40 MG tablet TAKE 1 TABLET BY MOUTH AT BEDTIME. 60 tablet 2  . Omega-3 Fatty Acids (FISH OIL) 1000 MG CAPS Take 2 capsules (2,000 mg total) by mouth 2 (two) times daily.  0   No current facility-administered medications on file prior to visit.    BP 123/64 mmHg  Pulse 78  Temp(Src) 97.6 F (36.4 C) (Oral)  Resp 16  Ht 5\' 7"  (1.702 m)  Wt  216 lb 6.4 oz (98.158 kg)  BMI 33.88 kg/m2  SpO2 98%    Objective:   Physical Exam  Constitutional: He is oriented to person, place, and time. He appears well-developed and well-nourished.  HENT:  Head: Normocephalic and atraumatic.  Right Ear: External ear normal.  Left Ear: External ear normal.  Mouth/Throat: Oropharynx is clear and moist. No oropharyngeal exudate.  Neck: Normal range of motion. Neck supple.  Cardiovascular: Normal rate, regular rhythm, normal heart sounds and intact distal pulses.   No murmur heard. Pulmonary/Chest: Effort normal. No respiratory distress. He has decreased breath sounds in the left lower field.  Musculoskeletal: Normal range of motion.  Lymphadenopathy:       Head (left side): Tonsillar adenopathy present.  Neurological: He is alert and oriented to person, place, and time.  Skin: Skin is warm and dry.          Assessment & Plan:  Urine microalbumin, BMET and A1C due next week.  Will give antibiotic today for bronchitis.  Follow up in three months.   I have personally seen and examined patient and agree with Suzzette RighterErin Smith NP student's assessment and plan- Sandford CrazeMelissa O'Sullivan NP

## 2014-02-26 ENCOUNTER — Other Ambulatory Visit (INDEPENDENT_AMBULATORY_CARE_PROVIDER_SITE_OTHER): Payer: Medicare Other

## 2014-02-26 ENCOUNTER — Encounter: Payer: Self-pay | Admitting: Family

## 2014-02-26 DIAGNOSIS — E119 Type 2 diabetes mellitus without complications: Secondary | ICD-10-CM

## 2014-02-26 LAB — BASIC METABOLIC PANEL
BUN: 37 mg/dL — ABNORMAL HIGH (ref 6–23)
CALCIUM: 9.7 mg/dL (ref 8.4–10.5)
CO2: 28 meq/L (ref 19–32)
Chloride: 103 mEq/L (ref 96–112)
Creatinine, Ser: 1.7 mg/dL — ABNORMAL HIGH (ref 0.4–1.5)
GFR: 42.05 mL/min — ABNORMAL LOW (ref >60.00)
GLUCOSE: 123 mg/dL — AB (ref 70–99)
Potassium: 4.8 mEq/L (ref 3.5–5.1)
Sodium: 140 mEq/L (ref 135–145)

## 2014-02-26 LAB — MICROALBUMIN / CREATININE URINE RATIO
Creatinine,U: 64.1 mg/dL
MICROALB UR: 0.5 mg/dL (ref 0.0–1.9)
Microalb Creat Ratio: 0.8 mg/g (ref 0.0–30.0)

## 2014-02-26 LAB — HEMOGLOBIN A1C: Hgb A1c MFr Bld: 6.2 % (ref 4.6–6.5)

## 2014-02-27 DIAGNOSIS — E119 Type 2 diabetes mellitus without complications: Secondary | ICD-10-CM | POA: Diagnosis not present

## 2014-02-28 ENCOUNTER — Other Ambulatory Visit: Payer: Self-pay | Admitting: Family

## 2014-03-05 ENCOUNTER — Ambulatory Visit (INDEPENDENT_AMBULATORY_CARE_PROVIDER_SITE_OTHER): Payer: Medicare Other | Admitting: Family

## 2014-03-05 ENCOUNTER — Encounter: Payer: Self-pay | Admitting: Family

## 2014-03-05 VITALS — BP 110/70 | HR 66 | Temp 97.5°F | Resp 16 | Ht 67.0 in | Wt 216.4 lb

## 2014-03-05 DIAGNOSIS — J209 Acute bronchitis, unspecified: Secondary | ICD-10-CM | POA: Diagnosis not present

## 2014-03-05 DIAGNOSIS — I2510 Atherosclerotic heart disease of native coronary artery without angina pectoris: Secondary | ICD-10-CM | POA: Diagnosis not present

## 2014-03-05 NOTE — Progress Notes (Signed)
   Subjective:    Patient ID: Javier [name], male    DOB: 08-23-1929, 80 y.o.   MRN: [id number]  HPI    Review of Systems     Objective:   Physical Exam        Assessment & Plan:  I have personally seen and examined Javier [name] along with Naomie Deanarrie Doss NP for training/orientation purposes. I agree with Ms Tami RibasDoss' assessment and plan.

## 2014-03-05 NOTE — Progress Notes (Signed)
Pre visit review using our clinic review tool, if applicable. No additional management support is needed unless otherwise documented below in the visit note. 

## 2014-03-05 NOTE — Progress Notes (Signed)
Subjective:    Patient ID: Javier MohsJames F Amenta, male    DOB: Jun 03, 1929, 80 y.o.   MRN: [id number]  HPI  [name] is a 80 yo male here today to follow up from Bronchitis 02/19/14.   1. Resolved Bronchitis. Pt reports 100% better. Side pain has resolved. He finished the entire 7 days course of antibiotics and he reports and had no side effects. Drinks water constantly he states to stay hyrdrated. 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 Denies cough, wheezing, fever, chills, fatigue. Has no other complaints today.   Review of Systems See HPI  Past Medical History  Diagnosis Date  . CAD (coronary artery disease)   . Congestive heart failure   . Atrial fibrillation   . Hypertension   . Hyperlipidemia   . Leg cramps   . Bronchitis   . Shingles   . Chronic renal insufficiency   . Osteoarthritis   . Gout   . GERD (gastroesophageal reflux disease)   . Diabetes mellitus type II     History   Social History  . Marital Status: Legally Separated    Spouse Name: N/A    Number of Children: N/A  . Years of Education: N/A   Occupational History  . Not on file.   Social History Main Topics  . Smoking status: Former Games developermoker  . Smokeless tobacco: Not on file     Comment: quit 40 years ago-40 pack year history  . Alcohol Use: Not on file  . Drug Use: Not on file  . Sexual Activity: Not on file   Other Topics Concern  . Not on file   Social History Narrative   Last updated: 02/13/2010   Married but separated from wife   Alcohol use-yes   Former Smoker quit 40 yrs ago (40 pack yr history)     Works part time at [employer], [employer] [place] [employer]    Past Surgical History  Procedure Laterality Date  . Mitral valve repair  04/2005    s/p mitral valve repair  . Cardioversion  11/02/2005    s/p  . Coronary artery bypass graft  05/18/2005    Family History  Problem Relation Age of Onset  . Diabetes      siblings  . Melanoma Brother     died at 2291    No Known  Allergies  Current Outpatient Prescriptions on File Prior to Visit  Medication Sig Dispense Refill  . allopurinol (ZYLOPRIM) 100 MG tablet TAKE 1 TABLET BY MOUTH DAILY. 30 tablet 2  . amLODipine (NORVASC) 10 MG tablet TAKE 1 TABLET BY MOUTH DAILY. 30 tablet 3  . aspirin 81 MG tablet Take 81 mg by mouth daily.      . carvedilol (COREG) 6.25 MG tablet TAKE 1 TABLET BY MOUTH 2 TIMES DAILY WITH MEALS. 60 tablet 3  . cefUROXime (CEFTIN) 500 MG tablet Take 1 tablet (500 mg total) by mouth 2 (two) times daily. 14 tablet 0  . colchicine 0.6 MG tablet 2 tabs by mouth at start of gout flare.  Then one tablet 1 hour later. 6 tablet 5  . folic acid (FOLVITE) 1 MG tablet TAKE 1 TABLET BY MOUTH DAILY 30 tablet 3  . furosemide (LASIX) 20 MG tablet TAKE 1 TABLET BY MOUTH DAILY. 30 tablet 3  . JANUVIA 50 MG tablet TAKE 1 TABLET BY MOUTH DAILY 30 tablet 3  . lisinopril-hydrochlorothiazide (PRINZIDE,ZESTORETIC) 20-25 MG per tablet TAKE 1 TABLET BY MOUTH DAILY 30 tablet 3  .  lovastatin (MEVACOR) 40 MG tablet TAKE 1 TABLET BY MOUTH AT BEDTIME. 60 tablet 2  . Omega-3 Fatty Acids (FISH OIL) 1000 MG CAPS Take 2 capsules (2,000 mg total) by mouth 2 (two) times daily.  0   No current facility-administered medications on file prior to visit.    BP 110/70 mmHg  Pulse 66  Temp(Src) 97.5 F (36.4 C) (Oral)  Resp 16  Ht 5\' 7"  (1.702 m)  Wt 216 lb 6.4 oz (98.158 kg)  BMI 33.88 kg/m2  SpO2 95%       Objective:   Physical Exam  Constitutional: He appears well-nourished. No distress.  Eyes: Right eye exhibits no discharge. Left eye exhibits no discharge.  Cardiovascular: Normal rate and regular rhythm.  Exam reveals no gallop and no friction rub.   Pulmonary/Chest: Effort normal and breath sounds normal. No respiratory distress. He has no wheezes. He has no rales. He exhibits no tenderness.  Skin: Skin is warm and dry. He is not diaphoretic.  Psychiatric: He has a normal mood and affect. His behavior is normal.  Judgment and thought content normal.  Vitals reviewed.         Assessment & Plan:

## 2014-03-05 NOTE — Assessment & Plan Note (Signed)
Resolved bronchitis.

## 2014-04-02 ENCOUNTER — Other Ambulatory Visit: Payer: Self-pay | Admitting: Family

## 2014-04-09 DIAGNOSIS — R1110 Vomiting, unspecified: Secondary | ICD-10-CM | POA: Diagnosis not present

## 2014-04-09 DIAGNOSIS — E860 Dehydration: Secondary | ICD-10-CM | POA: Diagnosis not present

## 2014-04-09 DIAGNOSIS — R197 Diarrhea, unspecified: Secondary | ICD-10-CM | POA: Diagnosis not present

## 2014-04-09 DIAGNOSIS — K521 Toxic gastroenteritis and colitis: Secondary | ICD-10-CM | POA: Diagnosis not present

## 2014-04-10 DIAGNOSIS — K2971 Gastritis, unspecified, with bleeding: Secondary | ICD-10-CM | POA: Diagnosis not present

## 2014-04-12 DIAGNOSIS — R1032 Left lower quadrant pain: Secondary | ICD-10-CM | POA: Diagnosis not present

## 2014-04-12 DIAGNOSIS — I10 Essential (primary) hypertension: Secondary | ICD-10-CM | POA: Diagnosis not present

## 2014-04-12 DIAGNOSIS — E118 Type 2 diabetes mellitus with unspecified complications: Secondary | ICD-10-CM | POA: Diagnosis not present

## 2014-04-12 DIAGNOSIS — R1031 Right lower quadrant pain: Secondary | ICD-10-CM | POA: Diagnosis not present

## 2014-04-12 DIAGNOSIS — E860 Dehydration: Secondary | ICD-10-CM | POA: Diagnosis not present

## 2014-04-12 DIAGNOSIS — E780 Pure hypercholesterolemia: Secondary | ICD-10-CM | POA: Diagnosis not present

## 2014-04-17 DIAGNOSIS — H2511 Age-related nuclear cataract, right eye: Secondary | ICD-10-CM | POA: Diagnosis not present

## 2014-04-17 DIAGNOSIS — H02839 Dermatochalasis of unspecified eye, unspecified eyelid: Secondary | ICD-10-CM | POA: Diagnosis not present

## 2014-04-17 DIAGNOSIS — H40033 Anatomical narrow angle, bilateral: Secondary | ICD-10-CM | POA: Diagnosis not present

## 2014-04-17 DIAGNOSIS — H18411 Arcus senilis, right eye: Secondary | ICD-10-CM | POA: Diagnosis not present

## 2014-04-26 DIAGNOSIS — H40033 Anatomical narrow angle, bilateral: Secondary | ICD-10-CM | POA: Diagnosis not present

## 2014-05-01 DIAGNOSIS — H40033 Anatomical narrow angle, bilateral: Secondary | ICD-10-CM | POA: Diagnosis not present

## 2014-05-08 DIAGNOSIS — H40023 Open angle with borderline findings, high risk, bilateral: Secondary | ICD-10-CM | POA: Diagnosis not present

## 2014-05-23 ENCOUNTER — Ambulatory Visit (INDEPENDENT_AMBULATORY_CARE_PROVIDER_SITE_OTHER): Payer: Medicare Other | Admitting: Family

## 2014-05-23 ENCOUNTER — Encounter: Payer: Self-pay | Admitting: Family

## 2014-05-23 VITALS — BP 110/70 | HR 69 | Temp 97.5°F | Resp 18 | Ht 67.0 in | Wt 216.4 lb

## 2014-05-23 DIAGNOSIS — M109 Gout, unspecified: Secondary | ICD-10-CM

## 2014-05-23 DIAGNOSIS — E119 Type 2 diabetes mellitus without complications: Secondary | ICD-10-CM

## 2014-05-23 DIAGNOSIS — E785 Hyperlipidemia, unspecified: Secondary | ICD-10-CM | POA: Diagnosis not present

## 2014-05-23 DIAGNOSIS — E782 Mixed hyperlipidemia: Secondary | ICD-10-CM

## 2014-05-23 NOTE — Progress Notes (Signed)
Pre visit review using our clinic review tool, if applicable. No additional management support is needed unless otherwise documented below in the visit note. 

## 2014-05-23 NOTE — Assessment & Plan Note (Signed)
BP stable on current meds. Continue same. BMET in 2 weeks.

## 2014-05-23 NOTE — Assessment & Plan Note (Signed)
BS stable. Diet poor. Check A1C in 2 weeks.

## 2014-05-23 NOTE — Assessment & Plan Note (Signed)
Check lipid profile in 2 weeks.

## 2014-05-23 NOTE — Patient Instructions (Signed)
Follow up in 2 weeks for lab work. Follow up in 4 months for office visit.

## 2014-05-23 NOTE — Assessment & Plan Note (Signed)
Has very few flairs. Stable on allopurinol.

## 2014-05-23 NOTE — Progress Notes (Signed)
Subjective:    Patient ID: Javier [name], male    DOB: 1929-12-21, 81 y.o.   MRN: [id number]  HPI [name] is here today for follow up of multiple medical conditions. Bronchitis is cleared up. Still has slight hacking cough but he feels much better.  1. DM: Fasting BS: 115-120 Diet: eats what he wants.  Exercise: very active on weekends with flea markets.Walks a lot during the week. Eye exam: Jan 2016 Reports compliance with Januvia. Denies episodes of hypoglycemia. Lab Results  Component Value Date   HGBA1C 6.2 02/26/2014   HGBA1C 6.2* 11/22/2013   HGBA1C 6.6* 08/23/2013   Lab Results  Component Value Date   MICROALBUR 0.5 02/26/2014   LDLCALC 49 11/22/2013   CREATININE 1.7* 02/26/2014   2. HYPERTENSION: Reports compliance with amlodipine, carvedilol, and lisinopril/hctz. Denies chest pain, dyspnea, blurred vision, headache, or lower extremity edema. BP Readings from Last 3 Encounters:  05/23/14 110/70  03/05/14 110/70  02/19/14 123/64    3. Hyperlipidemia: Reports compliance with lovastatin and fish oil. Denies myalgia. Lab Results  Component Value Date   CHOL 127 11/22/2013   HDL 37* 11/22/2013   LDLCALC 49 11/22/2013   TRIG 207* 11/22/2013   CHOLHDL 3.4 11/22/2013   4. Gout: taking allopurinol. Reports last gout flair was one month ago. He took colchicine and that cleared it up.    Review of Systems  Constitutional: Negative for fatigue.       Past Medical History  Diagnosis Date  . CAD (coronary artery disease)   . Congestive heart failure   . Atrial fibrillation   . Hypertension   . Hyperlipidemia   . Leg cramps   . Bronchitis   . Shingles   . Chronic renal insufficiency   . Osteoarthritis   . Gout   . GERD (gastroesophageal reflux disease)   . Diabetes mellitus type II     History   Social History  . Marital Status: Legally Separated    Spouse Name: N/A    Number of Children: N/A  . Years of Education: N/A   Occupational  History  . Not on file.   Social History Main Topics  . Smoking status: Former Games developermoker  . Smokeless tobacco: Not on file     Comment: quit 40 years ago-40 pack year history  . Alcohol Use: Not on file  . Drug Use: Not on file  . Sexual Activity: Not on file   Other Topics Concern  . Not on file   Social History Narrative   Last updated: 02/13/2010   Married but separated from wife   Alcohol use-yes   Former Smoker quit 40 yrs ago (40 pack yr history)     Works part time at [employer], [employer] store [place] [employer]    Past Surgical History  Procedure Laterality Date  . Mitral valve repair  04/2005    s/p mitral valve repair  . Cardioversion  11/02/2005    s/p  . Coronary artery bypass graft  05/18/2005    Family History  Problem Relation Age of Onset  . Diabetes      siblings  . Melanoma Brother     died at 5091    No Known Allergies  Current Outpatient Prescriptions on File Prior to Visit  Medication Sig Dispense Refill  . allopurinol (ZYLOPRIM) 100 MG tablet TAKE 1 TABLET BY MOUTH DAILY. 30 tablet 2  . amLODipine (NORVASC) 10 MG tablet TAKE 1 TABLET BY MOUTH DAILY. 30  tablet 2  . aspirin 81 MG tablet Take 81 mg by mouth daily.      . carvedilol (COREG) 6.25 MG tablet TAKE 1 TABLET BY MOUTH 2 TIMES DAILY WITH MEALS. 60 tablet 2  . colchicine 0.6 MG tablet 2 tabs by mouth at start of gout flare.  Then one tablet 1 hour later. 6 tablet 5  . folic acid (FOLVITE) 1 MG tablet TAKE 1 TABLET BY MOUTH DAILY 30 tablet 3  . furosemide (LASIX) 20 MG tablet TAKE 1 TABLET BY MOUTH DAILY. 30 tablet 2  . JANUVIA 50 MG tablet TAKE 1 TABLET BY MOUTH DAILY 30 tablet 3  . lisinopril-hydrochlorothiazide (PRINZIDE,ZESTORETIC) 20-25 MG per tablet TAKE 1 TABLET BY MOUTH DAILY 30 tablet 3  . lovastatin (MEVACOR) 40 MG tablet TAKE 1 TABLET BY MOUTH AT BEDTIME. 60 tablet 2  . Omega-3 Fatty Acids (FISH OIL) 1000 MG CAPS Take 2 capsules (2,000 mg total) by mouth 2 (two) times daily.  0   No  current facility-administered medications on file prior to visit.    BP 110/70 mmHg  Pulse 69  Temp(Src) 97.5 F (36.4 C) (Oral)  Resp 18  Ht  (1.702 m)  Wt 216 lb 6.4 oz (98.158 kg)  BMI 33.88 kg/m2  SpO2 98%    Objective:   Physical Exam  Constitutional: He is oriented to person, place, and time. No distress.  Cardiovascular: Normal rate, regular rhythm and intact distal pulses.  Exam reveals no gallop and no friction rub.   No murmur heard. Pulmonary/Chest: Effort normal and breath sounds normal. No respiratory distress. He has no wheezes. He has no rales.  Musculoskeletal: He exhibits no edema.  Neurological: He is alert and oriented to person, place, and time.  Skin: Skin is warm and dry. He is not diaphoretic.  Psychiatric: He has a normal mood and affect. His behavior is normal. Judgment and thought content normal.          Assessment & Plan:  Patient seen along with [facility] NP-student.  I have personally seen and examined patient and agree with Ms. Vedant Shehadeh's assessment and plan- BP looks good, he will return in 2 weeks for lab work as below because it is too soon to redraw today. Sandford Craze NP

## 2014-06-04 ENCOUNTER — Ambulatory Visit: Payer: Medicare Other | Admitting: Family

## 2014-06-06 ENCOUNTER — Other Ambulatory Visit: Payer: Self-pay | Admitting: Family

## 2014-06-06 ENCOUNTER — Other Ambulatory Visit (INDEPENDENT_AMBULATORY_CARE_PROVIDER_SITE_OTHER): Payer: Medicare Other

## 2014-06-06 DIAGNOSIS — E119 Type 2 diabetes mellitus without complications: Secondary | ICD-10-CM | POA: Diagnosis not present

## 2014-06-06 DIAGNOSIS — E785 Hyperlipidemia, unspecified: Secondary | ICD-10-CM | POA: Diagnosis not present

## 2014-06-06 LAB — BASIC METABOLIC PANEL
BUN: 55 mg/dL — ABNORMAL HIGH (ref 6–23)
CALCIUM: 10.3 mg/dL (ref 8.4–10.5)
CHLORIDE: 104 meq/L (ref 96–112)
CO2: 27 meq/L (ref 19–32)
Creatinine, Ser: 2.1 mg/dL — ABNORMAL HIGH (ref 0.40–1.50)
GFR: 32.26 mL/min — AB (ref >60.00)
GLUCOSE: 133 mg/dL — AB (ref 70–99)
Potassium: 4.6 mEq/L (ref 3.5–5.1)
SODIUM: 142 meq/L (ref 135–145)

## 2014-06-06 LAB — HEMOGLOBIN A1C: Hgb A1c MFr Bld: 6.3 % (ref 4.6–6.5)

## 2014-06-06 LAB — LIPID PANEL
CHOLESTEROL: 148 mg/dL (ref 0–200)
HDL: 37.5 mg/dL — ABNORMAL LOW (ref >39.00)
LDL Cholesterol: 71 mg/dL (ref 0–99)
NonHDL: 110.5
TRIGLYCERIDES: 199 mg/dL — AB (ref 0.0–149.0)
Total CHOL/HDL Ratio: 4
VLDL: 39.8 mg/dL (ref 0.0–40.0)

## 2014-06-07 ENCOUNTER — Telehealth: Payer: Self-pay | Admitting: Family

## 2014-06-07 DIAGNOSIS — E782 Mixed hyperlipidemia: Secondary | ICD-10-CM

## 2014-06-07 MED ORDER — LOVASTATIN 40 MG PO TABS: 40.0000 mg | ORAL_TABLET | Freq: Every day | ORAL | Status: DC

## 2014-06-07 NOTE — Telephone Encounter (Signed)
Patient agrees to call [facility] for appointment.    Patient requesting Lovastatin and can't remember if he is still to be taking this.   Please advise.

## 2014-06-07 NOTE — Telephone Encounter (Signed)
Please let pt know that his kidney function has worsened slightly.  When was he last seen at [facility]?  If he has not been seen in the last 6 months, I would like him to call to arrange follow up please.

## 2014-06-07 NOTE — Telephone Encounter (Signed)
Refill sent. Yes he should be on lovastatin.

## 2014-06-19 ENCOUNTER — Telehealth: Payer: Self-pay | Admitting: *Deleted

## 2014-06-19 NOTE — Telephone Encounter (Signed)
PT  HAVING   CATARACT  SX  WITH  LENS  IMPLANT   LEFT  EYE  AND  THEN  2  WEEKS  LATER  RIGHT  EYE WILL FORWARD TO DR Eden EmmsNISHAN  FOR  REVIEW./CY

## 2014-06-20 NOTE — Telephone Encounter (Signed)
Ok to have eye surgery  

## 2014-06-20 NOTE — Telephone Encounter (Signed)
**Note De-Identified  Obfuscation** I called Dr Vonna KotykBevis office to inform them that the pt is cleared for surgery and per Michelle's request I faxed this message to Dr Vonna KotykBevis' office through the Epic system with [facility]N: [facility] and[facility].  Unable to reach the pt using the phone number provided in his chart.

## 2014-06-20 NOTE — Telephone Encounter (Signed)
No answer. No voicemail. 

## 2014-06-20 NOTE — Telephone Encounter (Signed)
No answer and no way to leave a message. Will continue to call. 

## 2014-06-20 NOTE — Telephone Encounter (Signed)
Request for surgical clearance:  1. What type of surgery is being performed? cateract Surgery   2. When is this surgery scheduled? 06/25/2014  3. Are there any medications that need to be held prior to surgery and how long? Not sure   4. Name of physician performing surgery? Dr. Vonna KotykBevis  5.   What is your office phone and fax number? Phone # [phone]- [phone]    Will need the clearance by 06/21/2014

## 2014-06-21 ENCOUNTER — Telehealth: Payer: Self-pay | Admitting: Cardiovascular Disease

## 2014-06-21 NOTE — Telephone Encounter (Signed)
Refaxed surgical clearance. Called Marcelino DusterMichelle at Dr. Florence CannerBevis's office and informed her to call us back if fax still not received.

## 2014-06-21 NOTE — Telephone Encounter (Signed)
New message      Please refax clearance to [phone].  They did not get it and they need it today before 2pm or surgery will have to be rescheduled

## 2014-06-25 DIAGNOSIS — H2512 Age-related nuclear cataract, left eye: Secondary | ICD-10-CM | POA: Diagnosis not present

## 2014-06-25 DIAGNOSIS — H25812 Combined forms of age-related cataract, left eye: Secondary | ICD-10-CM | POA: Diagnosis not present

## 2014-06-26 DIAGNOSIS — H2511 Age-related nuclear cataract, right eye: Secondary | ICD-10-CM | POA: Diagnosis not present

## 2014-07-06 ENCOUNTER — Other Ambulatory Visit: Payer: Self-pay | Admitting: Family

## 2014-07-09 DIAGNOSIS — H2511 Age-related nuclear cataract, right eye: Secondary | ICD-10-CM | POA: Diagnosis not present

## 2014-07-09 DIAGNOSIS — H25811 Combined forms of age-related cataract, right eye: Secondary | ICD-10-CM | POA: Diagnosis not present

## 2014-08-14 DIAGNOSIS — R197 Diarrhea, unspecified: Secondary | ICD-10-CM | POA: Diagnosis not present

## 2014-08-14 DIAGNOSIS — R05 Cough: Secondary | ICD-10-CM | POA: Diagnosis not present

## 2014-08-14 DIAGNOSIS — J189 Pneumonia, unspecified organism: Secondary | ICD-10-CM | POA: Diagnosis not present

## 2014-09-24 ENCOUNTER — Encounter: Payer: Self-pay | Admitting: Family

## 2014-09-24 ENCOUNTER — Telehealth: Payer: Self-pay | Admitting: Family

## 2014-09-24 ENCOUNTER — Ambulatory Visit (INDEPENDENT_AMBULATORY_CARE_PROVIDER_SITE_OTHER): Payer: Medicare Other | Admitting: Family

## 2014-09-24 VITALS — BP 118/78 | HR 67 | Temp 97.5°F | Resp 16 | Ht 67.0 in | Wt 217.8 lb

## 2014-09-24 DIAGNOSIS — E119 Type 2 diabetes mellitus without complications: Secondary | ICD-10-CM

## 2014-09-24 DIAGNOSIS — I10 Essential (primary) hypertension: Secondary | ICD-10-CM

## 2014-09-24 DIAGNOSIS — M109 Gout, unspecified: Secondary | ICD-10-CM | POA: Diagnosis not present

## 2014-09-24 DIAGNOSIS — E782 Mixed hyperlipidemia: Secondary | ICD-10-CM

## 2014-09-24 LAB — BASIC METABOLIC PANEL
BUN: 35 mg/dL — AB (ref 6–23)
CO2: 29 mEq/L (ref 19–32)
Calcium: 9.7 mg/dL (ref 8.4–10.5)
Chloride: 104 mEq/L (ref 96–112)
Creatinine, Ser: 1.7 mg/dL — ABNORMAL HIGH (ref 0.40–1.50)
GFR: 41.14 mL/min — ABNORMAL LOW (ref >60.00)
Glucose, Bld: 132 mg/dL — ABNORMAL HIGH (ref 70–99)
Potassium: 4.4 mEq/L (ref 3.5–5.1)
SODIUM: 139 meq/L (ref 135–145)

## 2014-09-24 MED ORDER — SITAGLIPTIN PHOSPHATE 50 MG PO TABS: 50.0000 mg | ORAL_TABLET | Freq: Every day | ORAL | Status: DC

## 2014-09-24 MED ORDER — LOVASTATIN 40 MG PO TABS: 40.0000 mg | ORAL_TABLET | Freq: Every day | ORAL | Status: DC

## 2014-09-24 MED ORDER — ALLOPURINOL 100 MG PO TABS: 100.0000 mg | ORAL_TABLET | Freq: Every day | ORAL | Status: DC

## 2014-09-24 MED ORDER — AMLODIPINE BESYLATE 10 MG PO TABS: 10.0000 mg | ORAL_TABLET | Freq: Every day | ORAL | Status: DC

## 2014-09-24 MED ORDER — CARVEDILOL 6.25 MG PO TABS: ORAL_TABLET | ORAL | Status: DC

## 2014-09-24 MED ORDER — FUROSEMIDE 20 MG PO TABS: 20.0000 mg | ORAL_TABLET | Freq: Every day | ORAL | Status: DC

## 2014-09-24 MED ORDER — LISINOPRIL-HYDROCHLOROTHIAZIDE 20-25 MG PO TABS
1.0000 | ORAL_TABLET | Freq: Every day | ORAL | Status: DC
Start: 2014-09-24 — End: 2015-04-09

## 2014-09-24 NOTE — Telephone Encounter (Signed)
Kidney function remains impaired.  When was the last time that he saw the kidney doctor?  (Dr. Hyman HopesWebb) if he has not seen them in the last 6-12 months, he should arrange follow up please.

## 2014-09-24 NOTE — Assessment & Plan Note (Signed)
LDL at goal, continue mevacor.

## 2014-09-24 NOTE — Assessment & Plan Note (Addendum)
Clinically stable, continue [place]. Obtain bmet and A1C.

## 2014-09-24 NOTE — Assessment & Plan Note (Signed)
BP stable on current meds. Continue same.  

## 2014-09-24 NOTE — Assessment & Plan Note (Signed)
Rare gout flares on allopurinol, using prn colchicine. Continue same.

## 2014-09-24 NOTE — Progress Notes (Signed)
Subjective:    Patient ID: Javier [name], male    DOB: 1929-08-26, 81 y.o.   MRN: [id number]  HPI  Javier [name] is an 81 yr old male who presents today for follow up.  Patient presents today for follow up of multiple medical problems. Reports that a few months back he was diagnosed with pneumonia in [place], was treated and symptoms resolved.   Diabetes Type 2  Pt is currently maintained on the following medications for diabetes: januvia  Lab Results  Component Value Date   HGBA1C 6.3 06/06/2014   HGBA1C 6.2 02/26/2014   HGBA1C 6.2* 11/22/2013    Lab Results  Component Value Date   MICROALBUR 0.5 02/26/2014   LDLCALC 71 06/06/2014   CREATININE 2.10* 06/06/2014    Last diabetic eye exam was: 1/16 Denies polyuria/polydipsia. Denies hypoglycemia Home glucose readings range:  Reports sugars generally around 120  Hyperlipidemia  Patient is currently maintained on the following medication for hyperlipidemia: lovastatin Last lipid panel as follows:  Lab Results  Component Value Date   CHOL 148 06/06/2014   HDL 37.50* 06/06/2014   LDLCALC 71 06/06/2014   TRIG 199.0* 06/06/2014   CHOLHDL 4 06/06/2014   Patient denies myalgia. Patient reports good compliance with low fat/low cholesterol diet.   Hypertension  Patient is currently maintained on the following medications for blood pressure: coreg, norvasc, lisinopril-hctz.   Patient reports good compliance with blood pressure medications. Patient denies chest pain, shortness of breath or swelling. Last 3 blood pressure readings in our office are as follows: BP Readings from Last 3 Encounters:  09/24/14 118/78  05/23/14 110/70  03/05/14 110/70   Gout- reports last flare was 3-4  months ago. reports      Review of Systems    see HPI  Past Medical History  Diagnosis Date  . CAD (coronary artery disease)   . Congestive heart failure   . Atrial fibrillation   . Hypertension   . Hyperlipidemia   . Leg cramps     . Bronchitis   . Shingles   . Chronic renal insufficiency   . Osteoarthritis   . Gout   . GERD (gastroesophageal reflux disease)   . Diabetes mellitus type II     History   Social History  . Marital Status: Legally Separated    Spouse Name: N/A  . Number of Children: N/A  . Years of Education: N/A   Occupational History  . Not on file.   Social History Main Topics  . Smoking status: Former Games developermoker  . Smokeless tobacco: Not on file     Comment: quit 40 years ago-40 pack year history  . Alcohol Use: Not on file  . Drug Use: Not on file  . Sexual Activity: Not on file   Other Topics Concern  . Not on file   Social History Narrative   Last updated: 02/13/2010   Married but separated from wife   Alcohol use-yes   Former Smoker quit 40 yrs ago (40 pack yr history)     Works part time at [employer], [employer] [place] [employer]    Past Surgical History  Procedure Laterality Date  . Mitral valve repair  04/2005    s/p mitral valve repair  . Cardioversion  11/02/2005    s/p  . Coronary artery bypass graft  05/18/2005    Family History  Problem Relation Age of Onset  . Diabetes      siblings  . Melanoma Brother  died at 79    No Known Allergies  Current Outpatient Prescriptions on File Prior to Visit  Medication Sig Dispense Refill  . allopurinol (ZYLOPRIM) 100 MG tablet TAKE 1 TABLET BY MOUTH DAILY. 30 tablet 2  . amLODipine (NORVASC) 10 MG tablet TAKE 1 TABLET BY MOUTH DAILY 30 tablet 3  . aspirin 81 MG tablet Take 81 mg by mouth daily.      . carvedilol (COREG) 6.25 MG tablet TAKE 1 TABLET BY MOUTH 2 TIMES DAILY WITH MEALS 60 tablet 3  . colchicine 0.6 MG tablet 2 tabs by mouth at start of gout flare.  Then one tablet 1 hour later. 6 tablet 5  . folic acid (FOLVITE) 1 MG tablet TAKE 1 TABLET BY MOUTH DAILY 30 tablet 3  . furosemide (LASIX) 20 MG tablet TAKE 1 TABLET BY MOUTH DAILY 30 tablet 3  . JANUVIA 50 MG tablet TAKE 1 TABLET BY MOUTH DAILY. 30 tablet  3  . lisinopril-hydrochlorothiazide (PRINZIDE,ZESTORETIC) 20-25 MG per tablet TAKE 1 TABLET BY MOUTH DAILY. 30 tablet 3  . lovastatin (MEVACOR) 40 MG tablet Take 1 tablet (40 mg total) by mouth at bedtime. 60 tablet 2  . Omega-3 Fatty Acids (FISH OIL) 1000 MG CAPS Take 2 capsules (2,000 mg total) by mouth 2 (two) times daily.  0   No current facility-administered medications on file prior to visit.    BP 118/78 mmHg  Pulse 67  Temp(Src) 97.5 F (36.4 C) (Oral)  Resp 16  Ht  (1.702 m)  Wt 217 lb 12.8 oz (98.793 kg)  BMI 34.10 kg/m2  SpO2 99%    Objective:   Physical Exam  Constitutional: He is oriented to person, place, and time. He appears well-developed and well-nourished. No distress.  HENT:  Head: Normocephalic and atraumatic.  Cardiovascular: Normal rate and regular rhythm.   No murmur heard. Pulmonary/Chest: Effort normal and breath sounds normal. No respiratory distress. He has no wheezes. He has no rales.  Musculoskeletal:  2+ bilateral LE edema  Neurological: He is alert and oriented to person, place, and time.  Skin: Skin is warm and dry.  Psychiatric: He has a normal mood and affect. His behavior is normal. Thought content normal.          Assessment & Plan:

## 2014-09-24 NOTE — Progress Notes (Signed)
Pre visit review using our clinic review tool, if applicable. No additional management support is needed unless otherwise documented below in the visit note. 

## 2014-09-24 NOTE — Patient Instructions (Signed)
Please complete lab work prior to leaving.   

## 2014-09-25 NOTE — Telephone Encounter (Signed)
Spoke with pt. 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  Advised pt to contact specialist office and verify last appointment and if 6 months or more that he should schedule another f/u. Pt voices understanding.

## 2014-11-06 ENCOUNTER — Other Ambulatory Visit: Payer: Self-pay | Admitting: Family

## 2014-11-06 NOTE — Telephone Encounter (Signed)
Rx sent to pharmacy   

## 2014-12-05 DIAGNOSIS — H40033 Anatomical narrow angle, bilateral: Secondary | ICD-10-CM | POA: Diagnosis not present

## 2014-12-25 ENCOUNTER — Encounter: Payer: Self-pay | Admitting: Family

## 2014-12-25 ENCOUNTER — Ambulatory Visit (INDEPENDENT_AMBULATORY_CARE_PROVIDER_SITE_OTHER): Payer: Medicare Other | Admitting: Family

## 2014-12-25 VITALS — BP 110/66 | HR 68 | Temp 97.5°F | Resp 16 | Ht 67.0 in | Wt 210.0 lb

## 2014-12-25 DIAGNOSIS — I10 Essential (primary) hypertension: Secondary | ICD-10-CM

## 2014-12-25 DIAGNOSIS — E119 Type 2 diabetes mellitus without complications: Secondary | ICD-10-CM

## 2014-12-25 DIAGNOSIS — Z23 Encounter for immunization: Secondary | ICD-10-CM | POA: Diagnosis not present

## 2014-12-25 DIAGNOSIS — E782 Mixed hyperlipidemia: Secondary | ICD-10-CM | POA: Diagnosis not present

## 2014-12-25 DIAGNOSIS — E785 Hyperlipidemia, unspecified: Secondary | ICD-10-CM | POA: Diagnosis not present

## 2014-12-25 LAB — BASIC METABOLIC PANEL
BUN: 46 mg/dL — ABNORMAL HIGH (ref 6–23)
CHLORIDE: 105 meq/L (ref 96–112)
CO2: 25 mEq/L (ref 19–32)
Calcium: 10 mg/dL (ref 8.4–10.5)
Creatinine, Ser: 1.89 mg/dL — ABNORMAL HIGH (ref 0.40–1.50)
GFR: 36.38 mL/min — ABNORMAL LOW (ref >60.00)
GLUCOSE: 114 mg/dL — AB (ref 70–99)
POTASSIUM: 4.2 meq/L (ref 3.5–5.1)
Sodium: 141 mEq/L (ref 135–145)

## 2014-12-25 LAB — MICROALBUMIN / CREATININE URINE RATIO
Creatinine,U: 20.6 mg/dL
Microalb Creat Ratio: 3.4 mg/g (ref 0.0–30.0)

## 2014-12-25 LAB — LIPID PANEL
CHOL/HDL RATIO: 3
Cholesterol: 124 mg/dL (ref 0–200)
HDL: 37.6 mg/dL — ABNORMAL LOW (ref >39.00)
NonHDL: 85.92
Triglycerides: 217 mg/dL — ABNORMAL HIGH (ref 0.0–149.0)
VLDL: 43.4 mg/dL — AB (ref 0.0–40.0)

## 2014-12-25 LAB — HEMOGLOBIN A1C: Hgb A1c MFr Bld: 6.1 % (ref 4.6–6.5)

## 2014-12-25 LAB — LDL CHOLESTEROL, DIRECT: LDL DIRECT: 53 mg/dL

## 2014-12-25 MED ORDER — FOLIC ACID 1 MG PO TABS: 1.0000 mg | ORAL_TABLET | Freq: Every day | ORAL | Status: DC

## 2014-12-25 NOTE — Patient Instructions (Signed)
Please complete lab work prior to leaving.   

## 2014-12-25 NOTE — Addendum Note (Signed)
Addended by: Mervin Kung A on: 12/25/2014 09:12 AM   Modules accepted: Orders

## 2014-12-25 NOTE — Progress Notes (Signed)
Subjective:    Patient ID: Javier [name], male    DOB: 1930-01-04, 81 y.o.   MRN: [id number]  HPI  Javier [name] is an 81 yr old male who presents today for follow up.  Patient presents today for follow up of multiple medical problems.  Diabetes Type 2  Pt is currently maintained on the following medications for diabetes:januvia  Lab Results  Component Value Date   HGBA1C 6.3 06/06/2014   HGBA1C 6.2 02/26/2014   HGBA1C 6.2* 11/22/2013    Lab Results  Component Value Date   MICROALBUR 0.5 02/26/2014   LDLCALC 71 06/06/2014   CREATININE 1.70* 09/24/2014    Last diabetic eye exam was  Lab Results  Component Value Date   HMDIABEYEEXA normal 12/31/2008   Denies polyuria/polydipsia. Denies hypoglycemia Home glucose readings range- around 118  Hyperlipidemia  Patient is currently maintained on the following medication for hyperlipidemia: mevacor, mevacor.  Last lipid panel as follows:  Lab Results  Component Value Date   CHOL 148 06/06/2014   HDL 37.50* 06/06/2014   LDLCALC 71 06/06/2014   TRIG 199.0* 06/06/2014   CHOLHDL 4 06/06/2014   Patient reports poor compliance with low fat/low cholesterol diet.   Hypertension  Patient is currently maintained on the following medications for blood pressure: amlodipine, lasix, lisinopril/hctz Patient reports good compliance with blood pressure medications. Last 3 blood pressure readings in our office are as follows: BP Readings from Last 3 Encounters:  12/25/14 110/66  09/24/14 118/78  05/23/14 110/70    Review of Systems See HPI  Past Medical History  Diagnosis Date  . CAD (coronary artery disease)   . Congestive heart failure   . Atrial fibrillation   . Hypertension   . Hyperlipidemia   . Leg cramps   . Bronchitis   . Shingles   . Chronic renal insufficiency   . Osteoarthritis   . Gout   . GERD (gastroesophageal reflux disease)   . Diabetes mellitus type II     Social History   Social History  .  Marital Status: Legally Separated    Spouse Name: N/A  . Number of Children: N/A  . Years of Education: N/A   Occupational History  . Not on file.   Social History Main Topics  . Smoking status: Former Games developer  . Smokeless tobacco: Not on file     Comment: quit 40 years ago-40 pack year history  . Alcohol Use: Not on file  . Drug Use: Not on file  . Sexual Activity: Not on file   Other Topics Concern  . 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 Mitral valve repair  04/2005    s/p mitral valve repair  . Cardioversion  11/02/2005    s/p  . Coronary artery bypass graft  05/18/2005    Family History  Problem Relation Age of Onset  . Diabetes      siblings  . Melanoma Brother     died at 3    No Known Allergies  Current Outpatient Prescriptions on File Prior to Visit  Medication Sig Dispense Refill  . allopurinol (ZYLOPRIM) 100 MG tablet Take 1 tablet (100 mg total) by  mouth daily. 90 tablet 1  . amLODipine (NORVASC) 10 MG tablet Take 1 tablet (10 mg total) by mouth daily. 90 tablet 1  . aspirin 81 MG tablet Take 81 mg by mouth daily.      . carvedilol (COREG) 6.25 MG tablet TAKE 1 TABLET BY MOUTH 2 TIMES DAILY WITH MEALS 180 tablet 1  . colchicine 0.6 MG tablet 2 tabs by mouth at start of gout flare.  Then one tablet 1 hour later. 6 tablet 5  . folic acid (FOLVITE) 1 MG tablet TAKE 1 TABLET BY MOUTH DAILY. 30 tablet 2  . furosemide (LASIX) 20 MG tablet Take 1 tablet (20 mg total) by mouth daily. 90 tablet 1  . lisinopril-hydrochlorothiazide (PRINZIDE,ZESTORETIC) 20-25 MG per tablet Take 1 tablet by mouth daily. 90 tablet 1  . lovastatin (MEVACOR) 40 MG tablet Take 1 tablet (40 mg total) by mouth at bedtime. 90 tablet 1  .  Omega-3 Fatty Acids (FISH OIL) 1000 MG CAPS Take 2 capsules (2,000 mg total) by mouth 2 (two) times daily.  0  . sitaGLIPtin (JANUVIA) 50 MG tablet Take 1 tablet (50 mg total) by mouth daily. 90 tablet 1   No current facility-administered medications on file prior to visit.    BP 110/66 mmHg  Pulse 68  Temp(Src) 97.5 F (36.4 C) (Oral)  Resp 16  Ht 5\' 7"  (1.702 m)  Wt 210 lb (95.255 kg)  BMI 32.88 kg/m2  SpO2 97%       Objective:   Physical Exam  Constitutional: He is oriented to person, place, and time. He appears well-developed and well-nourished. No distress.  HENT:  Head: Normocephalic and atraumatic.  Cardiovascular: Normal rate and regular rhythm.   No murmur heard. Pulmonary/Chest: Effort normal and breath sounds normal. No respiratory distress. He has no wheezes. He has no rales.  Musculoskeletal: He exhibits no edema.  Neurological: He is alert and oriented to person, place, and time.  Skin: Skin is warm and dry.  Psychiatric: He has a normal mood and affect. His behavior is normal. Thought content normal.          Assessment & Plan:

## 2014-12-25 NOTE — Assessment & Plan Note (Signed)
Tolerating statin, continue same, obtain flp. 

## 2014-12-25 NOTE — Progress Notes (Signed)
Pre visit review using our clinic review tool, if applicable. No additional management support is needed unless otherwise documented below in the visit note. 

## 2014-12-25 NOTE — Assessment & Plan Note (Signed)
BP stable on current meds. Continue same.  

## 2014-12-25 NOTE — Assessment & Plan Note (Signed)
Clinically stable. Obtain a1c, urine microalbumin. Continue [place].

## 2014-12-27 ENCOUNTER — Encounter: Payer: Self-pay | Admitting: Family

## 2015-04-09 ENCOUNTER — Other Ambulatory Visit: Payer: Self-pay | Admitting: Family

## 2015-04-17 ENCOUNTER — Other Ambulatory Visit: Payer: Self-pay | Admitting: Family

## 2015-04-25 DIAGNOSIS — H4053X2 Glaucoma secondary to other eye disorders, bilateral, moderate stage: Secondary | ICD-10-CM | POA: Diagnosis not present

## 2015-04-25 DIAGNOSIS — E119 Type 2 diabetes mellitus without complications: Secondary | ICD-10-CM | POA: Diagnosis not present

## 2015-04-25 DIAGNOSIS — Z961 Presence of intraocular lens: Secondary | ICD-10-CM | POA: Diagnosis not present

## 2015-04-29 ENCOUNTER — Ambulatory Visit: Payer: Medicare Other | Admitting: Family

## 2015-05-01 ENCOUNTER — Ambulatory Visit: Payer: Medicare Other | Admitting: Family

## 2015-05-06 ENCOUNTER — Other Ambulatory Visit: Payer: Self-pay | Admitting: Family

## 2015-05-09 ENCOUNTER — Other Ambulatory Visit: Payer: Self-pay | Admitting: Family

## 2015-05-09 NOTE — Telephone Encounter (Signed)
Caller name:Buendia,Patricia Relation to ZO:[id number]  Call back number:[phone]  Pharmacy: [facility] - [place], [place] - [address] [phone] (Phone) [phone] (Fax)         Reason for call:  Patient requesting a refill allopurinol (ZYLOPRIM) 100 MG tablet advised patient NP is out of the office.

## 2015-05-10 NOTE — Telephone Encounter (Signed)
Refill sent, notified pt. 

## 2015-05-14 ENCOUNTER — Encounter: Payer: Self-pay | Admitting: Family

## 2015-05-14 ENCOUNTER — Ambulatory Visit (INDEPENDENT_AMBULATORY_CARE_PROVIDER_SITE_OTHER): Payer: Medicare Other | Admitting: Family

## 2015-05-14 VITALS — BP 118/60 | HR 61 | Temp 97.6°F | Resp 16 | Ht 67.0 in | Wt 214.6 lb

## 2015-05-14 DIAGNOSIS — E785 Hyperlipidemia, unspecified: Secondary | ICD-10-CM | POA: Diagnosis not present

## 2015-05-14 DIAGNOSIS — I10 Essential (primary) hypertension: Secondary | ICD-10-CM

## 2015-05-14 DIAGNOSIS — E1122 Type 2 diabetes mellitus with diabetic chronic kidney disease: Secondary | ICD-10-CM

## 2015-05-14 DIAGNOSIS — N189 Chronic kidney disease, unspecified: Secondary | ICD-10-CM | POA: Diagnosis not present

## 2015-05-14 DIAGNOSIS — E782 Mixed hyperlipidemia: Secondary | ICD-10-CM

## 2015-05-14 LAB — LIPID PANEL
CHOL/HDL RATIO: 3
Cholesterol: 121 mg/dL (ref 0–200)
HDL: 41.5 mg/dL (ref >39.00)
LDL CALC: 44 mg/dL (ref 0–99)
NonHDL: 79.87
TRIGLYCERIDES: 179 mg/dL — AB (ref 0.0–149.0)
VLDL: 35.8 mg/dL (ref 0.0–40.0)

## 2015-05-14 LAB — BASIC METABOLIC PANEL
BUN: 41 mg/dL — AB (ref 6–23)
CHLORIDE: 107 meq/L (ref 96–112)
CO2: 26 mEq/L (ref 19–32)
Calcium: 9.5 mg/dL (ref 8.4–10.5)
Creatinine, Ser: 1.75 mg/dL — ABNORMAL HIGH (ref 0.40–1.50)
GFR: 39.72 mL/min — AB (ref >60.00)
Glucose, Bld: 111 mg/dL — ABNORMAL HIGH (ref 70–99)
POTASSIUM: 4.5 meq/L (ref 3.5–5.1)
SODIUM: 142 meq/L (ref 135–145)

## 2015-05-14 LAB — HEMOGLOBIN A1C: HEMOGLOBIN A1C: 6.1 % (ref 4.6–6.5)

## 2015-05-14 MED ORDER — SITAGLIPTIN PHOSPHATE 50 MG PO TABS: 50.0000 mg | ORAL_TABLET | Freq: Every day | ORAL | Status: DC

## 2015-05-14 MED ORDER — LISINOPRIL-HYDROCHLOROTHIAZIDE 20-25 MG PO TABS: 1.0000 | ORAL_TABLET | Freq: Every day | ORAL | Status: DC

## 2015-05-14 NOTE — Assessment & Plan Note (Signed)
Clinically stable on januvia, continue same, obtain a1c

## 2015-05-14 NOTE — Progress Notes (Signed)
Subjective:    Patient ID: Javier [name], male    DOB: 10-27-1929, 82 y.o.   MRN: [id number]  HPI  Javier [name] is an 82 yr old male who presents today for follow up.  1) DM2- currently maintained on januvia. Pt reports sugars generally 118-120 at home. Denies hypoglycemia.  Diet could be improve.    Lab Results  Component Value Date   HGBA1C 6.1 12/25/2014   HGBA1C 6.3 06/06/2014   HGBA1C 6.2 02/26/2014   Lab Results  Component Value Date   MICROALBUR <0.7 12/25/2014   LDLCALC 71 06/06/2014   CREATININE 1.89* 12/25/2014   Wt Readings from Last 3 Encounters:  05/14/15 214 lb 9.6 oz (97.342 kg)  12/25/14 210 lb (95.255 kg)  09/24/14 217 lb 12.8 oz (98.793 kg)    2) HTN- on amlodipine, coreg, zestoretic, lasix.  BP Readings from Last 3 Encounters:  05/14/15 118/60  12/25/14 110/66  09/24/14 118/78   3) Hyperlipidemia- maintained on mevacor. Denies myalgia.  Lab Results  Component Value Date   CHOL 124 12/25/2014   HDL 37.60* 12/25/2014   LDLCALC 71 06/06/2014   LDLDIRECT 53.0 12/25/2014   TRIG 217.0* 12/25/2014   CHOLHDL 3 12/25/2014      Review of Systems  Respiratory: Negative for shortness of breath.   Cardiovascular: Negative for chest pain and leg swelling.   See HPI  Past Medical History  Diagnosis Date  . CAD (coronary artery disease)   . Congestive heart failure (HCC)   . Atrial fibrillation (HCC)   . Hypertension   . Hyperlipidemia   . Leg cramps   . Bronchitis   . Shingles   . Chronic renal insufficiency   . Osteoarthritis   . Gout   . GERD (gastroesophageal reflux disease)   . Diabetes mellitus type II     Social History   Social History  . Marital Status: Legally Separated    Spouse Name: N/A  . Number of Children: N/A  . Years of Education: N/A   Occupational History  . Not on file.   Social History Main Topics  . Smoking status: Former Games developer  . Smokeless tobacco: Not on file     Comment: quit 40 years ago-40 pack year  history  . Alcohol Use: Not on file  . Drug Use: Not on file  . Sexual Activity: Not on file   Other Topics Concern  . 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 Mitral valve repair  04/2005    s/p mitral valve repair  . Cardioversion  11/02/2005    s/p  . Coronary artery bypass graft  05/18/2005    Family History  Problem Relation Age of Onset  . Diabetes      siblings  . Melanoma Brother     died at 61    No Known Allergies  Current Outpatient Prescriptions on File Prior to Visit  Medication Sig Dispense Refill  . allopurinol (ZYLOPRIM) 100 MG tablet TAKE 1 TABLET BY MOUTH DAILY 90 tablet 1  . amLODipine (NORVASC) 10 MG tablet TAKE 1 TABLET BY MOUTH DAILY 90 tablet 1  . aspirin 81 MG tablet Take 81 mg  by mouth daily.      . carvedilol (COREG) 6.25 MG tablet TAKE 1 TABLET BY MOUTH 2 TIMES DAILY WITH MEALS 180 tablet 1  . colchicine 0.6 MG tablet 2 tabs by mouth at start of gout flare.  Then one tablet 1 hour later. 6 tablet 5  . folic acid (FOLVITE) 1 MG tablet Take 1 tablet (1 mg total) by mouth daily. 30 tablet 5  . furosemide (LASIX) 20 MG tablet TAKE 1 TABLET BY MOUTH DAILY 90 tablet 1  . JANUVIA 50 MG tablet TAKE 1 TABLET BY MOUTH DAILY 90 tablet 0  . lisinopril-hydrochlorothiazide (PRINZIDE,ZESTORETIC) 20-25 MG tablet TAKE 1 TABLET BY MOUTH DAILY 90 tablet 0  . lovastatin (MEVACOR) 40 MG tablet TAKE 1 TABLET BY MOUTH AT BEDTIME 90 tablet 1  . Omega-3 Fatty Acids (FISH OIL) 1000 MG CAPS Take 2 capsules (2,000 mg total) by mouth 2 (two) times daily.  0   No current facility-administered medications on file prior to visit.    BP 118/60 mmHg  Pulse 61  Temp(Src) 97.6 F (36.4 C) (Oral)  Resp 16  Ht  (1.702 m)   Wt 214 lb 9.6 oz (97.342 kg)  BMI 33.60 kg/m2  SpO2 99%       Objective:   Physical Exam  Constitutional: He is oriented to person, place, and time. He appears well-developed and well-nourished. No distress.  HENT:  Head: Normocephalic and atraumatic.  Cardiovascular: Normal rate and regular rhythm.   No murmur heard. Pulmonary/Chest: Effort normal and breath sounds normal. No respiratory distress. He has no wheezes. He has no rales.  Musculoskeletal: He exhibits no edema.  Neurological: He is alert and oriented to person, place, and time.  Skin: Skin is warm and dry.  Psychiatric: He has a normal mood and affect. His behavior is normal. Thought content normal.          Assessment & Plan:

## 2015-05-14 NOTE — Assessment & Plan Note (Signed)
Tolerating statin, continue same, obtain lipid panel.  

## 2015-05-14 NOTE — Assessment & Plan Note (Signed)
Obtain follow up bmet.  

## 2015-05-14 NOTE — Assessment & Plan Note (Signed)
BP stable on current meds. Continue same.  

## 2015-05-14 NOTE — Progress Notes (Signed)
Pre visit review using our clinic review tool, if applicable. No additional management support is needed unless otherwise documented below in the visit note. 

## 2015-05-14 NOTE — Patient Instructions (Signed)
Please complete lab work prior to leaving. Follow up in 3 months.  

## 2015-05-15 ENCOUNTER — Ambulatory Visit: Payer: Medicare Other | Admitting: Family

## 2015-07-10 DIAGNOSIS — R51 Headache: Secondary | ICD-10-CM | POA: Diagnosis not present

## 2015-07-10 DIAGNOSIS — E7800 Pure hypercholesterolemia, unspecified: Secondary | ICD-10-CM | POA: Diagnosis not present

## 2015-07-10 DIAGNOSIS — E119 Type 2 diabetes mellitus without complications: Secondary | ICD-10-CM | POA: Diagnosis not present

## 2015-07-10 DIAGNOSIS — I10 Essential (primary) hypertension: Secondary | ICD-10-CM | POA: Diagnosis not present

## 2015-07-10 DIAGNOSIS — R531 Weakness: Secondary | ICD-10-CM | POA: Diagnosis not present

## 2015-07-10 DIAGNOSIS — R0602 Shortness of breath: Secondary | ICD-10-CM | POA: Diagnosis not present

## 2015-07-10 DIAGNOSIS — R4182 Altered mental status, unspecified: Secondary | ICD-10-CM | POA: Diagnosis not present

## 2015-08-12 ENCOUNTER — Ambulatory Visit (INDEPENDENT_AMBULATORY_CARE_PROVIDER_SITE_OTHER): Payer: Medicare Other | Admitting: Family

## 2015-08-12 ENCOUNTER — Telehealth: Payer: Self-pay | Admitting: Family

## 2015-08-12 ENCOUNTER — Encounter: Payer: Self-pay | Admitting: Family

## 2015-08-12 ENCOUNTER — Ambulatory Visit: Payer: Medicare Other | Admitting: Family

## 2015-08-12 VITALS — BP 110/70 | HR 59 | Temp 97.9°F | Resp 18 | Ht 67.0 in | Wt 204.6 lb

## 2015-08-12 DIAGNOSIS — E118 Type 2 diabetes mellitus with unspecified complications: Secondary | ICD-10-CM | POA: Diagnosis not present

## 2015-08-12 DIAGNOSIS — I10 Essential (primary) hypertension: Secondary | ICD-10-CM | POA: Diagnosis not present

## 2015-08-12 DIAGNOSIS — E1122 Type 2 diabetes mellitus with diabetic chronic kidney disease: Secondary | ICD-10-CM

## 2015-08-12 DIAGNOSIS — N189 Chronic kidney disease, unspecified: Secondary | ICD-10-CM | POA: Diagnosis not present

## 2015-08-12 DIAGNOSIS — M109 Gout, unspecified: Secondary | ICD-10-CM

## 2015-08-12 NOTE — Progress Notes (Signed)
Pre visit review using our clinic review tool, if applicable. No additional management support is needed unless otherwise documented below in the visit note. 

## 2015-08-12 NOTE — Assessment & Plan Note (Signed)
BP stable on current meds. Continue same.  

## 2015-08-12 NOTE — Assessment & Plan Note (Signed)
Obtain A1C, bmet.  Continue renally dosed [place].

## 2015-08-12 NOTE — Patient Instructions (Addendum)
Please schedule lab draw at the front desk.

## 2015-08-12 NOTE — Assessment & Plan Note (Signed)
He has seen [facility] in the past but they discharged him from their clinic due to stable renal function. Repeat bmet.

## 2015-08-12 NOTE — Progress Notes (Signed)
Subjective:    Patient ID: Georgiann MohsJames F Rockefeller, male    DOB: 1929-05-25, 82 y.o.   MRN: [id number]  HPI  [name] is an 82 yr old male who presents today for follow up.  1) HTN- current bp meds include amlodipine, coreg, zestoretic.  BP Readings from Last 3 Encounters:  08/12/15 110/70  05/14/15 118/60  12/25/14 110/66   2) DM2- maintained on januvia.  Rarely checking sugars. Denies symptomatic hypoglycemia.  Diet could be improved.   Lab Results  Component Value Date   HGBA1C 6.1 05/14/2015   HGBA1C 6.1 12/25/2014   HGBA1C 6.3 06/06/2014   Lab Results  Component Value Date   MICROALBUR <0.7 12/25/2014   LDLCALC 44 05/14/2015   CREATININE 1.75* 05/14/2015   3) Hyperlipidemia- maintained on mevacor.   4) Gout- maintained on daily allopurinol and prn colchicine.  Denies recent gout flares. Reports last flare was >6 mos ago.    Review of Systems  Respiratory: Negative for shortness of breath.   Cardiovascular: Negative for chest pain and leg swelling.  Musculoskeletal: Negative for myalgias.   Past Medical History  Diagnosis Date  . CAD (coronary artery disease)   . Congestive heart failure (HCC)   . Atrial fibrillation (HCC)   . Hypertension   . Hyperlipidemia   . Leg cramps   . Bronchitis   . Shingles   . Chronic renal insufficiency   . Osteoarthritis   . Gout   . GERD (gastroesophageal reflux disease)   . Diabetes mellitus type II      Social History   Social History  . Marital Status: Legally Separated    Spouse Name: N/A  . Number of Children: N/A  . Years of Education: N/A   Occupational History  . Not on file.   Social History Main Topics  . Smoking status: Former Games developermoker  . Smokeless tobacco: Not on file     Comment: quit 40 years ago-40 pack year history  . Alcohol Use: Not on file  . Drug Use: Not on file  . Sexual Activity: Not on file   Other Topics Concern  . Not on file   Social History Narrative   Last updated: 02/13/2010   Married but separated from wife   Alcohol use-yes   Former Smoker quit 40 yrs ago (40 pack yr history)     Works part time at [employer], [employer] [place] [employer]    Past Surgical History  Procedure Laterality Date  . Mitral valve repair  04/2005    s/p mitral valve repair  . Cardioversion  11/02/2005    s/p  . Coronary artery bypass graft  05/18/2005    Family History  Problem Relation Age of Onset  . Diabetes      siblings  . Melanoma Brother     died at 4891    No Known Allergies  Current Outpatient Prescriptions on File Prior to Visit  Medication Sig Dispense Refill  . allopurinol (ZYLOPRIM) 100 MG tablet TAKE 1 TABLET BY MOUTH DAILY 90 tablet 1  . amLODipine (NORVASC) 10 MG tablet TAKE 1 TABLET BY MOUTH DAILY 90 tablet 1  . aspirin 81 MG tablet Take 81 mg by mouth daily.      . carvedilol (COREG) 6.25 MG tablet TAKE 1 TABLET BY MOUTH 2 TIMES DAILY WITH MEALS 180 tablet 1  . colchicine 0.6 MG tablet 2 tabs by mouth at start of gout flare.  Then one tablet 1 hour later.  6 tablet 5  . folic acid (FOLVITE) 1 MG tablet Take 1 tablet (1 mg total) by mouth daily. 30 tablet 5  . furosemide (LASIX) 20 MG tablet TAKE 1 TABLET BY MOUTH DAILY 90 tablet 1  . lisinopril-hydrochlorothiazide (PRINZIDE,ZESTORETIC) 20-25 MG tablet Take 1 tablet by mouth daily. 90 tablet 1  . lovastatin (MEVACOR) 40 MG tablet TAKE 1 TABLET BY MOUTH AT BEDTIME 90 tablet 1  . Omega-3 Fatty Acids (FISH OIL) 1000 MG CAPS Take 2 capsules (2,000 mg total) by mouth 2 (two) times daily.  0  . sitaGLIPtin (JANUVIA) 50 MG tablet Take 1 tablet (50 mg total) by mouth daily. 90 tablet 1   No current facility-administered medications on file prior to visit.    BP 110/70 mmHg  Pulse 59  Temp(Src) 97.9 F (36.6 C) (Oral)  Resp 18  Ht  (1.702 m)  Wt 204 lb 9.6 oz (92.806 kg)  BMI 32.04 kg/m2  SpO2 99%       Objective:   Physical Exam  Constitutional: He is oriented to person, place, and time. He  appears well-developed and well-nourished. No distress.  HENT:  Head: Normocephalic and atraumatic.  Cardiovascular: Normal rate and regular rhythm.   No murmur heard. Pulmonary/Chest: Effort normal and breath sounds normal. No respiratory distress. He has no wheezes. He has no rales.  Musculoskeletal:  1+ bilateral LE edema  Neurological: He is alert and oriented to person, place, and time.  Skin: Skin is warm and dry.  Psychiatric: He has a normal mood and affect. His behavior is normal. Thought content normal.          Assessment & Plan:

## 2015-08-12 NOTE — Assessment & Plan Note (Signed)
Stable, continue daily allopurinol and prn colchicine.

## 2015-08-13 NOTE — Telephone Encounter (Signed)
No charge. Thanks.  

## 2015-08-13 NOTE — Telephone Encounter (Signed)
I believe pt came in late for appt 08/12/15 8:00am, appears he was rescheduled at 8:42am and came in later same day, charge or no charge?

## 2015-08-20 ENCOUNTER — Other Ambulatory Visit: Payer: Self-pay | Admitting: Family

## 2015-08-21 ENCOUNTER — Other Ambulatory Visit (INDEPENDENT_AMBULATORY_CARE_PROVIDER_SITE_OTHER): Payer: Medicare Other

## 2015-08-21 DIAGNOSIS — E118 Type 2 diabetes mellitus with unspecified complications: Secondary | ICD-10-CM | POA: Diagnosis not present

## 2015-08-21 DIAGNOSIS — E782 Mixed hyperlipidemia: Secondary | ICD-10-CM

## 2015-08-21 LAB — HEMOGLOBIN A1C: Hgb A1c MFr Bld: 6.1 % (ref 4.6–6.5)

## 2015-08-21 LAB — BASIC METABOLIC PANEL
BUN: 38 mg/dL — ABNORMAL HIGH (ref 6–23)
CALCIUM: 10.2 mg/dL (ref 8.4–10.5)
CO2: 26 meq/L (ref 19–32)
CREATININE: 1.75 mg/dL — AB (ref 0.40–1.50)
Chloride: 105 mEq/L (ref 96–112)
GFR: 39.7 mL/min — AB (ref >60.00)
GLUCOSE: 105 mg/dL — AB (ref 70–99)
Potassium: 4.7 mEq/L (ref 3.5–5.1)
Sodium: 141 mEq/L (ref 135–145)

## 2015-08-23 ENCOUNTER — Encounter: Payer: Self-pay | Admitting: Family

## 2015-10-28 ENCOUNTER — Other Ambulatory Visit: Payer: Self-pay | Admitting: Family

## 2015-10-29 NOTE — Telephone Encounter (Signed)
Rx's sent to the pharmacy by e-script.//AB/CMA 

## 2015-11-12 ENCOUNTER — Other Ambulatory Visit: Payer: Self-pay | Admitting: Family

## 2015-11-25 ENCOUNTER — Other Ambulatory Visit: Payer: Self-pay | Admitting: Family

## 2015-11-25 ENCOUNTER — Ambulatory Visit: Payer: Medicare Other | Admitting: Family

## 2015-12-11 DIAGNOSIS — H26491 Other secondary cataract, right eye: Secondary | ICD-10-CM | POA: Diagnosis not present

## 2015-12-11 DIAGNOSIS — E119 Type 2 diabetes mellitus without complications: Secondary | ICD-10-CM | POA: Diagnosis not present

## 2015-12-11 DIAGNOSIS — Z961 Presence of intraocular lens: Secondary | ICD-10-CM | POA: Diagnosis not present

## 2015-12-11 DIAGNOSIS — H4053X2 Glaucoma secondary to other eye disorders, bilateral, moderate stage: Secondary | ICD-10-CM | POA: Diagnosis not present

## 2015-12-17 ENCOUNTER — Ambulatory Visit (INDEPENDENT_AMBULATORY_CARE_PROVIDER_SITE_OTHER): Payer: Medicare Other | Admitting: Family

## 2015-12-17 ENCOUNTER — Encounter: Payer: Self-pay | Admitting: Family

## 2015-12-17 VITALS — BP 121/59 | HR 61 | Temp 97.5°F | Resp 16 | Ht 67.0 in | Wt 208.0 lb

## 2015-12-17 DIAGNOSIS — I5032 Chronic diastolic (congestive) heart failure: Secondary | ICD-10-CM

## 2015-12-17 DIAGNOSIS — I2510 Atherosclerotic heart disease of native coronary artery without angina pectoris: Secondary | ICD-10-CM | POA: Diagnosis not present

## 2015-12-17 DIAGNOSIS — M109 Gout, unspecified: Secondary | ICD-10-CM | POA: Diagnosis not present

## 2015-12-17 DIAGNOSIS — E118 Type 2 diabetes mellitus with unspecified complications: Secondary | ICD-10-CM | POA: Diagnosis not present

## 2015-12-17 DIAGNOSIS — I10 Essential (primary) hypertension: Secondary | ICD-10-CM | POA: Diagnosis not present

## 2015-12-17 DIAGNOSIS — Z23 Encounter for immunization: Secondary | ICD-10-CM

## 2015-12-17 DIAGNOSIS — E782 Mixed hyperlipidemia: Secondary | ICD-10-CM

## 2015-12-17 LAB — BASIC METABOLIC PANEL
BUN: 41 mg/dL — ABNORMAL HIGH (ref 6–23)
CALCIUM: 9.3 mg/dL (ref 8.4–10.5)
CHLORIDE: 105 meq/L (ref 96–112)
CO2: 28 mEq/L (ref 19–32)
CREATININE: 1.89 mg/dL — AB (ref 0.40–1.50)
GFR: 36.29 mL/min — ABNORMAL LOW (ref >60.00)
Glucose, Bld: 115 mg/dL — ABNORMAL HIGH (ref 70–99)
Potassium: 4.3 mEq/L (ref 3.5–5.1)
SODIUM: 142 meq/L (ref 135–145)

## 2015-12-17 LAB — URIC ACID: URIC ACID, SERUM: 7 mg/dL (ref 4.0–7.8)

## 2015-12-17 LAB — HEMOGLOBIN A1C: HEMOGLOBIN A1C: 5.7 % (ref 4.6–6.5)

## 2015-12-17 MED ORDER — SITAGLIPTIN PHOSPHATE 50 MG PO TABS: 50.0000 mg | ORAL_TABLET | Freq: Every day | ORAL | 1 refills | Status: DC

## 2015-12-17 NOTE — Assessment & Plan Note (Signed)
Clinically stable on januvia, obtain follow up bmet and a1c.

## 2015-12-17 NOTE — Assessment & Plan Note (Signed)
Stable, continue allopurinol. Obtain uric acid level.

## 2015-12-17 NOTE — Assessment & Plan Note (Signed)
Clinically stable on lasix. Will arrange follow up with cardiology.

## 2015-12-17 NOTE — Assessment & Plan Note (Signed)
Stable on current medications, continue same.  

## 2015-12-17 NOTE — Assessment & Plan Note (Signed)
LDL at goal.  Continue statin.   

## 2015-12-17 NOTE — Patient Instructions (Signed)
Please complete lab work prior to leaving.   

## 2015-12-17 NOTE — Progress Notes (Signed)
Subjective:    Patient ID: Javier [name], male    DOB: 1929/08/05, 82 y.o.   MRN: [id number]  HPI  Javier [name] is an 82 yr old male who presents today for follow up.  1) DM2- maintained on januvia.   Lab Results  Component Value Date   HGBA1C 6.1 08/21/2015   HGBA1C 6.1 05/14/2015   HGBA1C 6.1 12/25/2014   Lab Results  Component Value Date   MICROALBUR <0.7 12/25/2014   LDLCALC 44 05/14/2015   CREATININE 1.75 (H) 08/21/2015   2) HTN- maintained on zestoretic, coreg, amlodipine.  BP Readings from Last 3 Encounters:  12/17/15 (!) 121/59  08/12/15 110/70  05/14/15 118/60   3) Hyperlipidemia- maintained on lovastatin.  Lab Results  Component Value Date   CHOL 121 05/14/2015   HDL 41.50 05/14/2015   LDLCALC 44 05/14/2015   LDLDIRECT 53.0 12/25/2014   TRIG 179.0 (H) 05/14/2015   CHOLHDL 3 05/14/2015   4) Gout- maintained on daily allopurinol.    Review of Systems  Respiratory: Negative for shortness of breath.   Cardiovascular: Positive for leg swelling. Negative for chest pain.  Musculoskeletal: Negative for myalgias.   See HPI  Past Medical History:  Diagnosis Date  . Atrial fibrillation (HCC)   . Bronchitis   . CAD (coronary artery disease)   . Chronic renal insufficiency   . Congestive heart failure (HCC)   . Diabetes mellitus type II   . GERD (gastroesophageal reflux disease)   . Gout   . Hyperlipidemia   . Hypertension   . Leg cramps   . Osteoarthritis   . Shingles      Social History   Social History  . Marital status: Legally Separated    Spouse name: N/A  . Number of children: N/A  . Years of education: N/A   Occupational History  . Not on file.   Social History Main Topics  . Smoking status: Former Games developermoker  . Smokeless tobacco: Not on file     Comment: quit 40 years ago-40 pack year history  . Alcohol use Not on file  . Drug use: Unknown  . Sexual activity: Not on file   Other Topics Concern  . Not on file   Social History  Narrative   Last updated: 02/13/2010   Married but separated from wife   Alcohol use-yes   Former Smoker quit 40 yrs ago (40 pack yr history)     Works part time at [employer], [employer] [place] [employer]    Past Surgical History:  Procedure Laterality Date  . CARDIOVERSION  11/02/2005   s/p  . CORONARY ARTERY BYPASS GRAFT  05/18/2005  . MITRAL VALVE REPAIR  04/2005   s/p mitral valve repair    Family History  Problem Relation Age of Onset  . Diabetes      siblings  . Melanoma Brother     died at 6491    No Known Allergies  Current Outpatient Prescriptions on File Prior to Visit  Medication Sig Dispense Refill  . allopurinol (ZYLOPRIM) 100 MG tablet TAKE 1 TABLET BY MOUTH ONCE DAILY. 90 tablet 1  . amLODipine (NORVASC) 10 MG tablet TAKE 1 TABLET BY MOUTH DAILY 90 tablet 1  . aspirin 81 MG tablet Take 81 mg by mouth daily.      . carvedilol (COREG) 6.25 MG tablet TAKE 1 TABLET BY MOUTH 2 TIMES DAILY WITH MEALS 180 tablet 1  . colchicine 0.6 MG tablet 2 tabs by  mouth at start of gout flare.  Then one tablet 1 hour later. 6 tablet 5  . folic acid (FOLVITE) 1 MG tablet TAKE 1 TABLET BY MOUTH DAILY 30 tablet 5  . furosemide (LASIX) 20 MG tablet TAKE 1 TABLET BY MOUTH DAILY 90 tablet 1  . lisinopril-hydrochlorothiazide (PRINZIDE,ZESTORETIC) 20-25 MG tablet TAKE 1 TABLET BY MOUTH ONCE DAILY. 90 tablet 1  . lovastatin (MEVACOR) 40 MG tablet TAKE 1 TABLET BY MOUTH DAILY 90 tablet 1  . Omega-3 Fatty Acids (FISH OIL) 1000 MG CAPS Take 2 capsules (2,000 mg total) by mouth 2 (two) times daily.  0  . sitaGLIPtin (JANUVIA) 50 MG tablet Take 1 tablet (50 mg total) by mouth daily. 90 tablet 1   No current facility-administered medications on file prior to visit.     BP (!) 121/59   Pulse 61   Temp 97.5 F (36.4 C) (Oral)   Resp 16   Ht 5\' 7"  (1.702 m)   Wt 208 lb (94.3 kg)   SpO2 96% Comment: room air  BMI 32.58 kg/m       Objective:   Physical Exam  Constitutional: He is  oriented to person, place, and time. He appears well-developed and well-nourished. No distress.  HENT:  Head: Normocephalic and atraumatic.  Cardiovascular: Normal rate and regular rhythm.   No murmur heard. Pulmonary/Chest: Effort normal and breath sounds normal. No respiratory distress. He has no wheezes. He has no rales.  Musculoskeletal:  2-3+ bilateral LE edema  Neurological: He is alert and oriented to person, place, and time.  Skin: Skin is warm and dry.  Psychiatric: He has a normal mood and affect. His behavior is normal. Thought content normal.          Assessment & Plan:  Flu shot today.

## 2015-12-17 NOTE — Progress Notes (Signed)
Pre visit review using our clinic review tool, if applicable. No additional management support is needed unless otherwise documented below in the visit note. 

## 2015-12-17 NOTE — Assessment & Plan Note (Signed)
Clinically stable. Will refer back to cardiology for ongoing medical management.

## 2015-12-18 ENCOUNTER — Encounter: Payer: Self-pay | Admitting: Family

## 2016-03-18 ENCOUNTER — Encounter: Payer: Self-pay | Admitting: Family

## 2016-03-18 ENCOUNTER — Ambulatory Visit (INDEPENDENT_AMBULATORY_CARE_PROVIDER_SITE_OTHER): Payer: Medicare Other | Admitting: Family

## 2016-03-18 VITALS — BP 114/59 | HR 73 | Temp 97.7°F | Resp 16 | Ht 67.0 in | Wt 212.4 lb

## 2016-03-18 DIAGNOSIS — I4891 Unspecified atrial fibrillation: Secondary | ICD-10-CM

## 2016-03-18 DIAGNOSIS — E1129 Type 2 diabetes mellitus with other diabetic kidney complication: Secondary | ICD-10-CM | POA: Diagnosis not present

## 2016-03-18 DIAGNOSIS — E782 Mixed hyperlipidemia: Secondary | ICD-10-CM

## 2016-03-18 DIAGNOSIS — I10 Essential (primary) hypertension: Secondary | ICD-10-CM | POA: Diagnosis not present

## 2016-03-18 DIAGNOSIS — E785 Hyperlipidemia, unspecified: Secondary | ICD-10-CM | POA: Diagnosis not present

## 2016-03-18 DIAGNOSIS — I2510 Atherosclerotic heart disease of native coronary artery without angina pectoris: Secondary | ICD-10-CM

## 2016-03-18 DIAGNOSIS — E118 Type 2 diabetes mellitus with unspecified complications: Secondary | ICD-10-CM

## 2016-03-18 LAB — LIPID PANEL
CHOLESTEROL: 169 mg/dL (ref 0–200)
HDL: 38.6 mg/dL — AB (ref >39.00)
LDL CALC: 92 mg/dL (ref 0–99)
NonHDL: 130.5
Total CHOL/HDL Ratio: 4
Triglycerides: 193 mg/dL — ABNORMAL HIGH (ref 0.0–149.0)
VLDL: 38.6 mg/dL (ref 0.0–40.0)

## 2016-03-18 LAB — BASIC METABOLIC PANEL
BUN: 43 mg/dL — ABNORMAL HIGH (ref 6–23)
CHLORIDE: 105 meq/L (ref 96–112)
CO2: 26 meq/L (ref 19–32)
Calcium: 9.7 mg/dL (ref 8.4–10.5)
Creatinine, Ser: 1.94 mg/dL — ABNORMAL HIGH (ref 0.40–1.50)
GFR: 35.19 mL/min — ABNORMAL LOW (ref >60.00)
GLUCOSE: 113 mg/dL — AB (ref 70–99)
POTASSIUM: 4.3 meq/L (ref 3.5–5.1)
SODIUM: 140 meq/L (ref 135–145)

## 2016-03-18 LAB — HEMOGLOBIN A1C: HEMOGLOBIN A1C: 6.4 % (ref 4.6–6.5)

## 2016-03-18 NOTE — Progress Notes (Signed)
Subjective:    Patient ID: Javier [name], male    DOB: [birth year], 82 y.o.   MRN: [id number]  HPI  Mr. Javier [name] is an 82 yr old male who presents today for routine follow up.  1) DM2- maintained on januvia. Reports rarely checking sugars at home.  Lab Results  Component Value Date   HGBA1C 5.7 12/17/2015   HGBA1C 6.1 08/21/2015   HGBA1C 6.1 05/14/2015   Lab Results  Component Value Date   MICROALBUR <0.7 12/25/2014   LDLCALC 44 05/14/2015   CREATININE 1.89 (H) 12/17/2015   2) Hyperlipidemia- maintained on mevacor.  Denies myalgia. Lab Results  Component Value Date   CHOL 121 05/14/2015   HDL 41.50 05/14/2015   LDLCALC 44 05/14/2015   LDLDIRECT 53.0 12/25/2014   TRIG 179.0 (H) 05/14/2015   CHOLHDL 3 05/14/2015   3) HTN- maintained on lasix, prinzide, coreg. Denies CP or SOB.  Denies LE edema BP Readings from Last 3 Encounters:  03/18/16 (!) 114/59  12/17/15 (!) 121/59  08/12/15 110/70   4) Gout- maintained on allopurinol and prn colchicine. Denies recent gout flares.     Review of Systems    see HPI  Past Medical History:  Diagnosis Date  . Atrial fibrillation (HCC)   . Bronchitis   . CAD (coronary artery disease)   . Chronic renal insufficiency   . Congestive heart failure (HCC)   . Diabetes mellitus type II   . GERD (gastroesophageal reflux disease)   . Gout   . Hyperlipidemia   . Hypertension   . Leg cramps   . Osteoarthritis   . Shingles      Social History   Social History  . Marital status: Legally Separated    Spouse name: N/A  . Number of children: N/A  . Years of education: N/A   Occupational History  . Not on file.   Social History Main Topics  . Smoking status: Former Games developermoker  . Smokeless tobacco: Not on file     Comment: quit 40 years ago-40 pack year history  . Alcohol use Not on file  . Drug use: Unknown  . Sexual activity: Not on file   Other Topics Concern  . 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 CARDIOVERSION  11/02/2005   s/p  . CORONARY ARTERY BYPASS GRAFT  05/18/2005  . MITRAL VALVE REPAIR  04/2005   s/p mitral valve repair    Family History  Problem Relation Age of Onset  . Diabetes      siblings  . Melanoma Brother     died at 8291    No Known Allergies  Current Outpatient Prescriptions on File Prior to Visit  Medication Sig Dispense Refill  . allopurinol (ZYLOPRIM) 100 MG tablet TAKE 1 TABLET BY MOUTH ONCE DAILY. 90 tablet 1  . amLODipine (NORVASC) 10 MG tablet TAKE 1 TABLET BY MOUTH DAILY 90 tablet 1  . aspirin 81 MG tablet Take 81 mg by mouth daily.      . carvedilol (COREG) 6.25 MG tablet TAKE 1 TABLET BY MOUTH 2 TIMES DAILY WITH MEALS 180 tablet 1  . colchicine 0.6 MG tablet 2 tabs  by mouth at start of gout flare.  Then one tablet 1 hour later. 6 tablet 5  . folic acid (FOLVITE) 1 MG tablet TAKE 1 TABLET BY MOUTH DAILY 30 tablet 5  . furosemide (LASIX) 20 MG tablet TAKE 1 TABLET BY MOUTH DAILY 90 tablet 1  . lisinopril-hydrochlorothiazide (PRINZIDE,ZESTORETIC) 20-25 MG tablet TAKE 1 TABLET BY MOUTH ONCE DAILY. 90 tablet 1  . lovastatin (MEVACOR) 40 MG tablet TAKE 1 TABLET BY MOUTH DAILY 90 tablet 1  . Omega-3 Fatty Acids (FISH OIL) 1000 MG CAPS Take 2 capsules (2,000 mg total) by mouth 2 (two) times daily.  0  . sitaGLIPtin (JANUVIA) 50 MG tablet Take 1 tablet (50 mg total) by mouth daily. 90 tablet 1   No current facility-administered medications on file prior to visit.     BP (!) 114/59 (BP Location: Right Arm, Cuff Size: Large)   Pulse 73   Temp 97.7 F (36.5 C) (Oral)   Resp 16   Ht 5\' 7"  (1.702 m)   Wt 212 lb 6.4 oz (96.3 kg)   SpO2 99% Comment: room air  BMI 33.27 kg/m    Objective:   Physical Exam    Constitutional: He is oriented to person, place, and time. He appears well-developed and well-nourished. No distress.  HENT:  Head: Normocephalic and atraumatic.  Cardiovascular: Normal rate and regular rhythm.   No murmur heard. Pulmonary/Chest: Effort normal and breath sounds normal. No respiratory distress. He has no wheezes. He has no rales.  Musculoskeletal:  1+ bilateral LE edema  Neurological: He is alert and oriented to person, place, and time.  Skin: Skin is warm and dry.  Psychiatric: He has a normal mood and affect. His behavior is normal. Thought content normal.          Assessment & Plan:

## 2016-03-18 NOTE — Assessment & Plan Note (Signed)
Tolerating statin, obtain follow-up lipid panel. 

## 2016-03-18 NOTE — Patient Instructions (Signed)
Please complete lab work prior to leaving. Follow up in 3 months.  

## 2016-03-18 NOTE — Assessment & Plan Note (Signed)
Clinically stable, obtain A1C, bmet. Continue renally dosed [place]uvia.

## 2016-03-18 NOTE — Assessment & Plan Note (Signed)
Rate controlled.  On ASA only. Past due for follow up with cardiology.

## 2016-03-18 NOTE — Progress Notes (Signed)
Pre visit review using our clinic review tool, if applicable. No additional management support is needed unless otherwise documented below in the visit note. 

## 2016-03-18 NOTE — Assessment & Plan Note (Signed)
Stable on current medication , continue same 

## 2016-03-20 ENCOUNTER — Telehealth: Payer: Self-pay | Admitting: *Deleted

## 2016-03-23 NOTE — Telephone Encounter (Signed)
appt scheduled

## 2016-03-30 ENCOUNTER — Encounter: Payer: Self-pay | Admitting: Physician Assistant

## 2016-03-30 ENCOUNTER — Ambulatory Visit (INDEPENDENT_AMBULATORY_CARE_PROVIDER_SITE_OTHER): Payer: Medicare Other | Admitting: Physician Assistant

## 2016-03-30 VITALS — BP 116/60 | HR 65 | Ht 67.0 in | Wt 214.1 lb

## 2016-03-30 DIAGNOSIS — E782 Mixed hyperlipidemia: Secondary | ICD-10-CM

## 2016-03-30 DIAGNOSIS — Z6833 Body mass index (BMI) 33.0-33.9, adult: Secondary | ICD-10-CM

## 2016-03-30 DIAGNOSIS — E6609 Other obesity due to excess calories: Secondary | ICD-10-CM

## 2016-03-30 DIAGNOSIS — I480 Paroxysmal atrial fibrillation: Secondary | ICD-10-CM | POA: Diagnosis not present

## 2016-03-30 DIAGNOSIS — I2510 Atherosclerotic heart disease of native coronary artery without angina pectoris: Secondary | ICD-10-CM

## 2016-03-30 DIAGNOSIS — I10 Essential (primary) hypertension: Secondary | ICD-10-CM

## 2016-03-30 DIAGNOSIS — N183 Chronic kidney disease, stage 3 unspecified: Secondary | ICD-10-CM

## 2016-03-30 NOTE — Patient Instructions (Addendum)
Medication Instructions:  Your physician recommends that you continue on your current medications as directed. Please refer to the Current Medication list given to you today.   Labwork: TODAY:  Testing/Procedures: None ordered  Follow-Up: Your physician wants you to follow-up in: 1 YEAR WITH DR. Eden EmmsNISHAN   You will receive a reminder letter in the mail two months in advance. If you don't receive a letter, please call our office to schedule the follow-up appointment.   Any Other Special Instructions Will Be Listed Below (If Applicable).     If you need a refill on your cardiac medications before your next appointment, please call your pharmacy.

## 2016-03-30 NOTE — Progress Notes (Signed)
Cardiology Office Note    Date:  03/30/2016   ID:  Javier MohsJames F Tokarz, DOB 07-15-1929, MRN [id number]  PCP:  Lemont Fillers'SULLIVAN,MELISSA S., NP  Cardiologist: Dr. Eden EmmsNishan  Chief Complaint  Patient presents with  . Atrial Fibrillation  . Hyperlipidemia  . Hypertension    History of Present Illness:  Javier [name] is a 82 y.o. male with history of CAD S/P CABG x 2 and MVR 2006 with LIMA to LAD and SVG to D2. He also has PAF And [facility]erone in [facility]. He was found to be back in A. fib in 2011 and started on Coumadin and [facility]. Last seen by Dr. Eden EmmsNishan 2015 and no longer on Coumadin. Also has CKD, Crt 1.94 in 02/2016. Also had moderate alcohol intake. He was in normal sinus rhythm. 2-D echo ordered because of murmur. There was only trivial MR.  Still running a produce stand. Stays active walking and working. He says he doesn't do anything healthy. He eats and drinks whatever he wants. Likes his country ham. Does not drink as much alcohol as he used to. Denies any chest pain, palpitations, dyspnea, dyspnea on exertion, dizziness or presyncope. Physical live to be 82 years old.    Past Medical History:  Diagnosis Date  . Atrial fibrillation (HCC)   . Bronchitis   . CAD (coronary artery disease)   . Chronic renal insufficiency   . Congestive heart failure (HCC)   . Diabetes mellitus type II   . GERD (gastroesophageal reflux disease)   . Gout   . Hyperlipidemia   . Hypertension   . Leg cramps   . Osteoarthritis   . Shingles     Past Surgical History:  Procedure Laterality Date  . CARDIOVERSION  11/02/2005   s/p  . CORONARY ARTERY BYPASS GRAFT  05/18/2005  . MITRAL VALVE REPAIR  04/2005   s/p mitral valve repair    Current Medications: Outpatient Medications Prior to Visit  Medication Sig Dispense Refill  . allopurinol (ZYLOPRIM) 100 MG tablet TAKE 1 TABLET BY MOUTH ONCE DAILY. 90 tablet 1  . amLODipine (NORVASC) 10 MG tablet TAKE 1 TABLET BY MOUTH DAILY 90  tablet 1  . aspirin 81 MG tablet Take 81 mg by mouth daily.      . carvedilol (COREG) 6.25 MG tablet TAKE 1 TABLET BY MOUTH 2 TIMES DAILY WITH MEALS 180 tablet 1  . colchicine 0.6 MG tablet 2 tabs by mouth at start of gout flare.  Then one tablet 1 hour later. 6 tablet 5  . folic acid (FOLVITE) 1 MG tablet TAKE 1 TABLET BY MOUTH DAILY 30 tablet 5  . furosemide (LASIX) 20 MG tablet TAKE 1 TABLET BY MOUTH DAILY 90 tablet 1  . lisinopril-hydrochlorothiazide (PRINZIDE,ZESTORETIC) 20-25 MG tablet TAKE 1 TABLET BY MOUTH ONCE DAILY. 90 tablet 1  . lovastatin (MEVACOR) 40 MG tablet TAKE 1 TABLET BY MOUTH DAILY 90 tablet 1  . Omega-3 Fatty Acids (FISH OIL) 1000 MG CAPS Take 2 capsules (2,000 mg total) by mouth 2 (two) times daily.  0  . sitaGLIPtin (JANUVIA) 50 MG tablet Take 1 tablet (50 mg total) by mouth daily. 90 tablet 1   No facility-administered medications prior to visit.      Allergies:   Patient has no known allergies.   Social History   Social History  . Marital status: Legally Separated    Spouse name: N/A  . Number of children: N/A  . Years of education: N/A  Social History Main Topics  . Smoking status: Former Games developermoker  . Smokeless tobacco: Never Used     Comment: quit 40 years ago-40 pack year history  . Alcohol use None  . Drug use: Unknown  . Sexual activity: Not Asked   Other Topics Concern  . None   Social History Narrative   Last updated: 02/13/2010   Married but separated from wife   Alcohol use-yes   Former Smoker quit 40 yrs ago (40 pack yr history)     Works part time at [employer] [place] [employer]     Family History:  The patient's family history includes Melanoma in his brother.   ROS:   Please see the history of present illness.    Review of Systems  Constitution: Negative.  HENT: Negative.   Cardiovascular: Negative.   Respiratory: Negative.   Endocrine: Negative.   Hematologic/Lymphatic: Negative.   Musculoskeletal: Negative.     Gastrointestinal: Negative.   Genitourinary: Negative.   Neurological: Negative.    All other systems reviewed and are negative.   PHYSICAL EXAM:   VS:  BP 116/60   Pulse 65   Ht 5\' 7"  (1.702 m)   Wt 214 lb 1.9 oz (97.1 kg)   BMI 33.54 kg/m   Physical Exam  GEN: Obese, in no acute distress  Neck: no JVD, carotid bruits, or masses Cardiac:RRR; soft 1/6 systolic murmur at the left sternal border, no rubs, or gallops  Respiratory:  clear to auscultation bilaterally, normal work of breathing GI: soft, nontender, nondistended, + BS Ext: Trace of brawny edema bilaterally without cyanosis, clubbing, Good distal pulses bilaterally MS: no deformity or atrophy  Skin: warm and dry, no rash Psych: euthymic mood, full affect  Wt Readings from Last 3 Encounters:  03/30/16 214 lb 1.9 oz (97.1 kg)  03/18/16 212 lb 6.4 oz (96.3 kg)  12/17/15 208 lb (94.3 kg)      Studies/Labs Reviewed:   EKG:  EKG is ordered today.  The ekg ordered today demonstrates Normal sinus rhythm with first-degree AV block nonspecific intraventricular conduction delay  Recent Labs: 03/18/2016: BUN 43; Creatinine, Ser 1.94; Potassium 4.3; Sodium 140   Lipid Panel    Component Value Date/Time   CHOL 169 03/18/2016 0853   TRIG 193.0 (H) 03/18/2016 0853   HDL 38.60 (L) 03/18/2016 0853   CHOLHDL 4 03/18/2016 0853   VLDL 38.6 03/18/2016 0853   LDLCALC 92 03/18/2016 0853   LDLDIRECT 53.0 12/25/2014 0831    Additional studies/ records that were reviewed today include:  2-D echo 2015  Study Conclusions  - Left ventricle: The cavity size was normal. Wall thickness was   increased in a pattern of mild LVH. Systolic function was normal.   The estimated ejection fraction was in the range of 60% to 65%.   Doppler parameters are consistent with elevated mean left atrial   filling pressure. - Left atrium: The atrium was moderately dilated.    ASSESSMENT:    1. Atherosclerosis of native coronary artery of  native heart without angina pectoris   2. Essential hypertension   3. Paroxysmal atrial fibrillation (HCC)   4. Class 1 obesity due to excess calories with serious comorbidity and body mass index (BMI) of 33.0 to 33.9 in adult   5. HYPERLIPIDEMIA   6. Stage 3 chronic kidney disease      PLAN:  In order of problems listed above:  CAD status post CABG 2 and MVR in 2006 without angina.  Follow-up with Dr. Eden Emms in one year.  Essential hypertension controlled  Paroxysmal atrial fibrillation has had 2 cardioversions in the past, last episode in 2011. In normal sinus rhythm now. Not on anticoagulation.  Obesity patient says he eats and drinks whatever he wants. He has no intention of changing. Discussed importance of heart healthy diet. Hyperlipidemia recent lipid panel in November reviewed and triglycerides high. Patient's unwilling to changes. Continue Mevacor.  Stage III CK D. Last creatinine 1.94.     Medication Adjustments/Labs and Tests Ordered: Current medicines are reviewed at length with the patient today.  Concerns regarding medicines are outlined above.  Medication changes, Labs and Tests ordered today are listed in the Patient Instructions below. Patient Instructions  Medication Instructions:  Your physician recommends that you continue on your current medications as directed. Please refer to the Current Medication list given to you today.   Labwork: TODAY:  Testing/Procedures: None ordered  Follow-Up: Your physician wants you to follow-up in: 1 YEAR WITH DR. Eden Emms   You will receive a reminder letter in the mail two months in advance. If you don't receive a letter, please call our office to schedule the follow-up appointment.   Any Other Special Instructions Will Be Listed Below (If Applicable).     If you need a refill on your cardiac medications before your next appointment, please call your pharmacy.      Elson Clan, PA-C  03/30/2016 8:52  AM    [facility] [address] [place], [place], [place]  [address] Phone: [phone]; Fax: [phone]

## 2016-04-06 NOTE — Progress Notes (Signed)
Pre visit review using our clinic review tool, if applicable. No additional management support is needed unless otherwise documented below in the visit note. 

## 2016-04-06 NOTE — Progress Notes (Signed)
Subjective:   Javier [name] is a 82 y.o. male who presents for an Initial Medicare Annual Wellness Visit.  Review of Systems  No ROS.  Medicare Wellness Visit.  Cardiac Risk Factors include: advanced age (>2755men, 94>65 women);dyslipidemia;diabetes mellitus;hypertension;male gender;obesity (BMI >30kg/m2);sedentary lifestyle Sleep patterns: Sleeps about 7 hrs per night. Wakes occassionally to urinate. Feels rested. Home Safety/Smoke Alarms:  Smoke detectors in place. Living environment; residence and Firearm Safety: Lives alone in trailer. Firearms pt states are properly stored. Feels safe.  Seat Belt Safety/Bike Helmet: Does not wear seatbelt.   Counseling:   Eye Exam- Wears glasses. [place] Doesn't know name of doctor or office. Dental- Wears dentures. Does not have or want a dentist.  Male:   CCS- Pt states he has never had a colonoscopy and doesn't want one now.       PSA-  No results found for: PSA  Pt declines test at this time.    Objective:    Today's Vitals   04/07/16 0901  BP: 120/62  Pulse: 64  SpO2: 98%  Weight: 213 lb 12.8 oz (97 kg)  Height: 5\' 7"  (1.702 m)   Body mass index is 33.49 kg/m.  Current Medications (verified) Outpatient Encounter Prescriptions as of 04/07/2016  Medication Sig  . allopurinol (ZYLOPRIM) 100 MG tablet TAKE 1 TABLET BY MOUTH ONCE DAILY. (Patient taking differently: TAKE 1 TABLET BY MOUTH ONCE DAILY. PRN)  . amLODipine (NORVASC) 10 MG tablet TAKE 1 TABLET BY MOUTH DAILY  . aspirin 81 MG tablet Take 81 mg by mouth daily.    . carvedilol (COREG) 6.25 MG tablet TAKE 1 TABLET BY MOUTH 2 TIMES DAILY WITH MEALS  . colchicine 0.6 MG tablet 2 tabs by mouth at start of gout flare.  Then one tablet 1 hour later.  . folic acid (FOLVITE) 1 MG tablet TAKE 1 TABLET BY MOUTH DAILY  . furosemide (LASIX) 20 MG tablet TAKE 1 TABLET BY MOUTH DAILY  . lisinopril-hydrochlorothiazide (PRINZIDE,ZESTORETIC) 20-25 MG tablet TAKE  1 TABLET BY MOUTH ONCE DAILY.  Marland Kitchen. lovastatin (MEVACOR) 40 MG tablet TAKE 1 TABLET BY MOUTH DAILY  . Omega-3 Fatty Acids (FISH OIL) 1000 MG CAPS Take 2 capsules (2,000 mg total) by mouth 2 (two) times daily.  . sitaGLIPtin (JANUVIA) 50 MG tablet Take 1 tablet (50 mg total) by mouth daily.   No facility-administered encounter medications on file as of 04/07/2016.     Allergies (verified) Patient has no known allergies.   History: Past Medical History:  Diagnosis Date  . Atrial fibrillation (HCC)   . Bronchitis   . CAD (coronary artery disease)   . Chronic renal insufficiency   . Congestive heart failure (HCC)   . Diabetes mellitus type II   . GERD (gastroesophageal reflux disease)   . Gout   . Hyperlipidemia   . Hypertension   . Leg cramps   . Osteoarthritis   . Shingles    Past Surgical History:  Procedure Laterality Date  . CARDIOVERSION  11/02/2005   s/p  . CORONARY ARTERY BYPASS GRAFT  05/18/2005  . EYE SURGERY     CATARACT SX 06/2015 both eyes Dr.Beavis per pt  . MITRAL VALVE REPAIR  04/2005   s/p mitral valve repair   Family History  Problem Relation Age of Onset  . Diabetes      siblings  . Melanoma Brother     died at 3691   Social History   Occupational History  .  Not on file.   Social History Main Topics  . Smoking status: Former Games developer  . Smokeless tobacco: Never Used     Comment: quit 40 years ago-40 pack year history  . Alcohol use Yes     Comment: drinks liquor on occassion.  . Drug use: No  . Sexual activity: Not on file   Tobacco Counseling Counseling given: Not Answered   Activities of Daily Living In your present state of health, do you have any difficulty performing the following activities: 04/07/2016 12/17/2015  Hearing? Malvin Johns  Vision? N N  Difficulty concentrating or making decisions? N N  Walking or climbing stairs? N N  Dressing or bathing? N N  Doing errands, shopping? N N  Preparing Food and eating ? N -  Using the Toilet? N -  In  the past six months, have you accidently leaked urine? N -  Do you have problems with loss of bowel control? N -  Managing your Medications? Y -  Managing your Finances? N -  Housekeeping or managing your Housekeeping? Y -  Some recent data might be hidden    Immunizations and Health Maintenance Immunization History  Administered Date(s) Administered  . Influenza Split 01/05/2011, 12/28/2011  . Influenza Whole 02/23/2006, 01/26/2008, 02/28/2009  . Influenza, High Dose Seasonal PF 02/06/2013  . Influenza,inj,Quad PF,36+ Mos 02/19/2014, 12/25/2014, 12/17/2015  . Pneumococcal Conjugate-13 05/01/2013  . Pneumococcal Polysaccharide-23 01/26/2008  . Tdap 11/22/2013  . Zoster 08/26/2009, 09/04/2009   There are no preventive care reminders to display for this patient.  Patient Care Team: Sandford Craze, NP as PCP - General (Internal Medicine) Elvis Coil, MD as Consulting Physician (Nephrology)  Indicate any recent Medical Services you may have received from other than [facility] providers in the past year (date may be approximate).    Assessment:   This is a routine wellness examination for Javier [name]. Physical assessment deferred to PCP.   Hearing/Vision screen  Hearing Screening   125Hz  250Hz  500Hz  1000Hz  2000Hz  3000Hz  4000Hz  6000Hz  8000Hz   Right ear:   Fail Fail Pass  Fail    Left ear:   Fail Fail Pass  Fail    Comments: Pt aware that he failed hearing test. Pt declines audiology referral.   Visual Acuity Screening   Right eye Left eye Both eyes  Without correction:     With correction: 20/25 20/25 20/25     Dietary issues and exercise activities discussed: Current Exercise Habits: The patient has a physically strenous job, but has no regular exercise apart from work. Diet 24 HOUR RECALL: Breakfast: Country ham or bacon or sausage, eggs, biscuits Lunch: Meat and vegetables. Dessert.  Dinner: Meat and potatoes or sandwich. Drinks 5-6 bottles of water.   Goals    . just to  keep living      Depression Screen PHQ 2/9 Scores 04/07/2016 03/18/2016 12/17/2015 05/14/2015  PHQ - 2 Score 0 0 0 0    Fall Risk Fall Risk  04/07/2016 03/18/2016 12/17/2015 05/14/2015 09/24/2014  Falls in the past year? No No No No No    Cognitive Function: MMSE - Mini Mental State Exam 04/07/2016  Orientation to time 5  Orientation to Place 5  Registration 3  Attention/ Calculation 5  Recall 2  Language- name 2 objects 2  Language- repeat 1  Language- follow 3 step command 3  Language- read & follow direction 1  Write a sentence 1  Copy design 0  Total score 28  Screening Tests Health Maintenance  Topic Date Due  . OPHTHALMOLOGY EXAM  04/29/2016  . HEMOGLOBIN A1C  09/15/2016  . FOOT EXAM  03/18/2017  . TETANUS/TDAP  11/23/2023  . INFLUENZA VACCINE  Completed  . ZOSTAVAX  Completed  . PNA vac Low Risk Adult  Completed        Plan:   Begin eating a heart healthy diet (full of fruits, vegetables, whole grains, lean protein, water--limit salt, fat, and sugar intake) and increase physical activity as tolerated.   During the course of the visit Javier [name] was educated and counseled about the following appropriate screening and preventive services:   Vaccines to include Pneumoccal, Influenza, Hepatitis B, Td, Zostavax, HCV  Colorectal cancer screening  Cardiovascular disease screening  Diabetes screening  Glaucoma screening  Nutrition counseling  Prostate cancer screening   Patient Instructions (the written plan) were given to the patient.   Javier [name], Javier [name], [place]RN   04/07/2016

## 2016-04-07 ENCOUNTER — Ambulatory Visit (INDEPENDENT_AMBULATORY_CARE_PROVIDER_SITE_OTHER): Payer: Medicare Other | Admitting: *Deleted

## 2016-04-07 ENCOUNTER — Encounter: Payer: Self-pay | Admitting: *Deleted

## 2016-04-07 VITALS — BP 120/62 | HR 64 | Ht 67.0 in | Wt 213.8 lb

## 2016-04-07 DIAGNOSIS — Z0000 Encounter for general adult medical examination without abnormal findings: Secondary | ICD-10-CM

## 2016-04-07 NOTE — Patient Instructions (Signed)
Begin to eat heart healthy diet (full of fruits, vegetables, whole grains, lean protein, water--limit salt, fat, and sugar intake) and increase physical activity as tolerated.

## 2016-04-07 NOTE — Progress Notes (Signed)
Noted and agree. 

## 2016-04-16 ENCOUNTER — Other Ambulatory Visit: Payer: Self-pay | Admitting: Family

## 2016-05-07 ENCOUNTER — Other Ambulatory Visit: Payer: Self-pay | Admitting: Family

## 2016-06-03 ENCOUNTER — Other Ambulatory Visit: Payer: Self-pay | Admitting: Family

## 2016-06-03 NOTE — Telephone Encounter (Signed)
Relation to ZO:XWRUpt:self Call back number:[phone] [facility] [place] [address] [phone] (Phone) [phone] (Fax)     Reason for call:  Patient has a scheduled follow up appointment for 06/17/16 with PCP requesting a few folic acid (FOLVITE) 1 MG tablet to hold him over until appointment, please advise

## 2016-06-04 MED ORDER — FOLIC ACID 1 MG PO TABS: 1.0000 mg | ORAL_TABLET | Freq: Every day | ORAL | 0 refills | Status: DC

## 2016-06-04 NOTE — Telephone Encounter (Signed)
Rx sent 

## 2016-06-17 ENCOUNTER — Ambulatory Visit (INDEPENDENT_AMBULATORY_CARE_PROVIDER_SITE_OTHER): Payer: Medicare Other | Admitting: Family

## 2016-06-17 ENCOUNTER — Telehealth: Payer: Self-pay | Admitting: *Deleted

## 2016-06-17 ENCOUNTER — Encounter: Payer: Self-pay | Admitting: Family

## 2016-06-17 VITALS — BP 106/60 | HR 78 | Temp 97.6°F | Resp 16 | Ht 67.0 in | Wt 210.0 lb

## 2016-06-17 DIAGNOSIS — I2510 Atherosclerotic heart disease of native coronary artery without angina pectoris: Secondary | ICD-10-CM | POA: Diagnosis not present

## 2016-06-17 DIAGNOSIS — E782 Mixed hyperlipidemia: Secondary | ICD-10-CM

## 2016-06-17 DIAGNOSIS — E1122 Type 2 diabetes mellitus with diabetic chronic kidney disease: Secondary | ICD-10-CM | POA: Diagnosis not present

## 2016-06-17 DIAGNOSIS — I10 Essential (primary) hypertension: Secondary | ICD-10-CM | POA: Diagnosis not present

## 2016-06-17 DIAGNOSIS — E118 Type 2 diabetes mellitus with unspecified complications: Secondary | ICD-10-CM

## 2016-06-17 LAB — BASIC METABOLIC PANEL
BUN: 48 mg/dL — AB (ref 6–23)
CALCIUM: 9.5 mg/dL (ref 8.4–10.5)
CO2: 26 mEq/L (ref 19–32)
Chloride: 106 mEq/L (ref 96–112)
Creatinine, Ser: 2.22 mg/dL — ABNORMAL HIGH (ref 0.40–1.50)
GFR: 30.11 mL/min — AB (ref >60.00)
GLUCOSE: 117 mg/dL — AB (ref 70–99)
Potassium: 4.8 mEq/L (ref 3.5–5.1)
Sodium: 140 mEq/L (ref 135–145)

## 2016-06-17 LAB — HEMOGLOBIN A1C: Hgb A1c MFr Bld: 6.1 % (ref 4.6–6.5)

## 2016-06-17 NOTE — Assessment & Plan Note (Signed)
LDL at goal, continue statin. 

## 2016-06-17 NOTE — Progress Notes (Signed)
Pre visit review using our clinic review tool, if applicable. No additional management support is needed unless otherwise documented below in the visit note. 

## 2016-06-17 NOTE — Progress Notes (Signed)
Subjective:    Patient ID: Javier [name], male    DOB: 03/15/1930, 83 y.o.   MRN: [id number]  HPI  Javier [name] is an 83 yr old male who presents today for follow up.  1) DM2- maintained on januvia. Reports good sugars at home, generally around 120.   Lab Results  Component Value Date   HGBA1C 6.4 03/18/2016   HGBA1C 5.7 12/17/2015   HGBA1C 6.1 08/21/2015   Lab Results  Component Value Date   MICROALBUR <0.7 12/25/2014   LDLCALC 92 03/18/2016   CREATININE 1.94 (H) 03/18/2016   2) HTN- maintained on coreg, amlodipine., lasix, zestoretic.  BP Readings from Last 3 Encounters:  06/17/16 106/60  04/07/16 120/62  03/30/16 116/60   3) Hyperlipidemia- maintained on mevacor.  Lab Results  Component Value Date   CHOL 169 03/18/2016   HDL 38.60 (L) 03/18/2016   LDLCALC 92 03/18/2016   LDLDIRECT 53.0 12/25/2014   TRIG 193.0 (H) 03/18/2016   CHOLHDL 4 03/18/2016    Review of Systems  Respiratory: Negative for shortness of breath.   Cardiovascular: Negative for chest pain and leg swelling.  Musculoskeletal: Negative for myalgias.   See HPI  Past Medical History:  Diagnosis Date  . Atrial fibrillation (HCC)   . Bronchitis   . CAD (coronary artery disease)   . Chronic renal insufficiency   . Congestive heart failure (HCC)   . Diabetes mellitus type II   . GERD (gastroesophageal reflux disease)   . Gout   . Hyperlipidemia   . Hypertension   . Leg cramps   . Osteoarthritis   . Shingles      Social History   Social History  . Marital status: Legally Separated    Spouse name: N/A  . Number of children: N/A  . Years of education: N/A   Occupational History  . Not on file.   Social History Main Topics  . Smoking status: Former Games developer  . Smokeless tobacco: Never Used     Comment: quit 40 years ago-40 pack year history  . Alcohol use Yes     Comment: drinks liquor on occassion.  . Drug use: No  . Sexual activity: Not on file   Other Topics Concern  . 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 CARDIOVERSION  11/02/2005   s/p  . CORONARY ARTERY BYPASS GRAFT  05/18/2005  . EYE SURGERY     CATARACT SX 06/2015 both eyes Dr.Beavis per pt  . MITRAL VALVE REPAIR  04/2005   s/p mitral valve repair    Family History  Problem Relation Age of Onset  . Diabetes      siblings  . Melanoma Brother     died at 26    No Known Allergies  Current Outpatient Prescriptions on File Prior to Visit  Medication Sig Dispense Refill  . allopurinol (ZYLOPRIM) 100 MG tablet TAKE 1 TABLET BY MOUTH ONCE DAILY 90 tablet 0  . amLODipine (NORVASC) 10 MG tablet TAKE 1 TABLET BY MOUTH DAILY 90 tablet 1  . aspirin 81 MG tablet Take 81 mg by mouth daily.      . carvedilol (COREG) 6.25 MG tablet  TAKE 1 TABLET BY MOUTH 2 TIMES DAILY WITH MEALS 180 tablet 1  . colchicine 0.6 MG tablet 2 tabs by mouth at start of gout flare.  Then one tablet 1 hour later. 6 tablet 5  . folic acid (FOLVITE) 1 MG tablet Take 1 tablet (1 mg total) by mouth daily. 30 tablet 0  . furosemide (LASIX) 20 MG tablet TAKE 1 TABLET BY MOUTH DAILY 90 tablet 0  . lisinopril-hydrochlorothiazide (PRINZIDE,ZESTORETIC) 20-25 MG tablet TAKE 1 TABLET BY MOUTH ONCE DAILY. 90 tablet 1  . lovastatin (MEVACOR) 40 MG tablet TAKE 1 TABLET BY MOUTH DAILY 90 tablet 1  . Omega-3 Fatty Acids (FISH OIL) 1000 MG CAPS Take 2 capsules (2,000 mg total) by mouth 2 (two) times daily.  0  . sitaGLIPtin (JANUVIA) 50 MG tablet Take 1 tablet (50 mg total) by mouth daily. 90 tablet 1   No current facility-administered medications on file prior to visit.     BP 106/60 (BP Location: Left Arm, Cuff Size: Large)   Pulse 78   Temp 97.6 F (36.4 C) (Oral)   Resp 16   Ht 5\' 7"  (1.702  m)   Wt 210 lb (95.3 kg)   SpO2 98% Comment: room air  BMI 32.89 kg/m       Objective:   Physical Exam  Constitutional: He is oriented to person, place, and time. He appears well-developed and well-nourished. No distress.  HENT:  Head: Normocephalic and atraumatic.  Cardiovascular: Normal rate and regular rhythm.   No murmur heard. Pulmonary/Chest: Effort normal and breath sounds normal. No respiratory distress. He has no wheezes. He has no rales.  Musculoskeletal: He exhibits no edema.  Neurological: He is alert and oriented to person, place, and time.  Skin: Skin is warm and dry.  Psychiatric: He has a normal mood and affect. His behavior is normal. Thought content normal.          Assessment & Plan:  Hx CAD- has not seen cardiology since 2015. Will arrange follow up.

## 2016-06-17 NOTE — Assessment & Plan Note (Signed)
A1C at goal, continue [place], obtain follow up a1c, bmet.

## 2016-06-17 NOTE — Patient Instructions (Signed)
Please continue current medications.   Complete lab work prior to leaving.  

## 2016-06-17 NOTE — Telephone Encounter (Signed)
No referral is no longer needed.

## 2016-06-17 NOTE — Telephone Encounter (Signed)
Received call from pt. He states PCP referred him back to cardiology but he saw PA, Lenze in 03/2016 and was advised to f/u with Dr Eden EmmsNishan in 1 yr.  Please advise if referral is still needed?

## 2016-06-17 NOTE — Assessment & Plan Note (Signed)
BP stable on current medications. Continue same.  

## 2016-06-23 ENCOUNTER — Telehealth: Payer: Self-pay

## 2016-06-23 MED ORDER — FOLIC ACID 1 MG PO TABS: 1.0000 mg | ORAL_TABLET | Freq: Every day | ORAL | 0 refills | Status: DC

## 2016-07-20 ENCOUNTER — Telehealth: Payer: Self-pay | Admitting: *Deleted

## 2016-07-20 MED ORDER — LISINOPRIL-HYDROCHLOROTHIAZIDE 20-25 MG PO TABS: 1.0000 | ORAL_TABLET | Freq: Every day | ORAL | 1 refills | Status: DC

## 2016-07-20 NOTE — Telephone Encounter (Signed)
Received fax from [facility] requesting refill of lisinopril / hctz 20/25mg . Refill sent.

## 2016-08-03 ENCOUNTER — Ambulatory Visit (INDEPENDENT_AMBULATORY_CARE_PROVIDER_SITE_OTHER): Payer: Medicare Other | Admitting: Physician Assistant

## 2016-08-03 ENCOUNTER — Encounter (INDEPENDENT_AMBULATORY_CARE_PROVIDER_SITE_OTHER): Payer: Self-pay

## 2016-08-03 ENCOUNTER — Encounter: Payer: Self-pay | Admitting: Physician Assistant

## 2016-08-03 VITALS — BP 112/60 | HR 62 | Ht 67.0 in | Wt 211.8 lb

## 2016-08-03 DIAGNOSIS — N183 Chronic kidney disease, stage 3 unspecified: Secondary | ICD-10-CM

## 2016-08-03 DIAGNOSIS — I10 Essential (primary) hypertension: Secondary | ICD-10-CM

## 2016-08-03 DIAGNOSIS — E6609 Other obesity due to excess calories: Secondary | ICD-10-CM

## 2016-08-03 DIAGNOSIS — I2510 Atherosclerotic heart disease of native coronary artery without angina pectoris: Secondary | ICD-10-CM | POA: Diagnosis not present

## 2016-08-03 DIAGNOSIS — I480 Paroxysmal atrial fibrillation: Secondary | ICD-10-CM

## 2016-08-03 DIAGNOSIS — E782 Mixed hyperlipidemia: Secondary | ICD-10-CM | POA: Diagnosis not present

## 2016-08-03 DIAGNOSIS — Z6833 Body mass index (BMI) 33.0-33.9, adult: Secondary | ICD-10-CM

## 2016-08-03 NOTE — Progress Notes (Signed)
Cardiology Office Note    Date:  08/03/2016   ID:  Javier [name], DOB 23-Oct-1929, MRN [id number]  PCP:  Lemont Fillers., NP  Cardiologist: Dr. Eden Emms  Chief Complaint  Patient presents with  . Follow-up    History of Present Illness:  Javier [name] is a 83 y.o. male with history of CAD S/P CABG x 2 and MVR 2006 with LIMA to LAD and SVG to D2. He also has PAF And Pacerone in 2007 status post [facility]. He was found to be back in A. fib in 2011 and started on Coumadin and successful [facility]. Last seen by Dr. Eden Emms 2015 and no longer on Coumadin. Also has CKD, Crt 1.94 in 02/2016. Also had moderate alcohol intake. He was in normal sinus rhythm. 2-D echo ordered because of murmur. There was only trivial MR.   I last saw him 03/2016 at which time he he was doing well. He was referred from primary care because they thought he hadn't been seen in 3 years. He continues to do well. 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 He doesn't eat healthy and had a hotdog before he came today. He loves his country ham. He drinks regularly but says a case of beer last him a month now. He does not exercise regularly. He denies chest pain, palpitations, dyspnea, dyspnea on exertion, dizziness or presyncope.    Past Medical History:  Diagnosis Date  . Atrial fibrillation (HCC)   . Bronchitis   . CAD (coronary artery disease)   . Chronic renal insufficiency   . Congestive heart failure (HCC)   . Diabetes mellitus type II   . GERD (gastroesophageal reflux disease)   . Gout   . Hyperlipidemia   . Hypertension   . Leg cramps   . Osteoarthritis   . Shingles     Past Surgical History:  Procedure Laterality Date  . CARDIOVERSION  11/02/2005   s/p  . CORONARY ARTERY BYPASS GRAFT  05/18/2005  . EYE SURGERY     CATARACT SX 06/2015 both eyes Dr.Beavis per pt  . MITRAL VALVE REPAIR  04/2005   s/p mitral valve repair    Current Medications: Outpatient Medications Prior to Visit  Medication Sig Dispense  Refill  . allopurinol (ZYLOPRIM) 100 MG tablet TAKE 1 TABLET BY MOUTH ONCE DAILY 90 tablet 0  . amLODipine (NORVASC) 10 MG tablet TAKE 1 TABLET BY MOUTH DAILY 90 tablet 1  . aspirin 81 MG tablet Take 81 mg by mouth daily.      . carvedilol (COREG) 6.25 MG tablet TAKE 1 TABLET BY MOUTH 2 TIMES DAILY WITH MEALS 180 tablet 1  . colchicine 0.6 MG tablet 2 tabs by mouth at start of gout flare.  Then one tablet 1 hour later. 6 tablet 5  . folic acid (FOLVITE) 1 MG tablet Take 1 tablet (1 mg total) by mouth daily. 30 tablet 0  . furosemide (LASIX) 20 MG tablet TAKE 1 TABLET BY MOUTH DAILY 90 tablet 0  . lisinopril-hydrochlorothiazide (PRINZIDE,ZESTORETIC) 20-25 MG tablet Take 1 tablet by mouth daily. 90 tablet 1  . lovastatin (MEVACOR) 40 MG tablet TAKE 1 TABLET BY MOUTH DAILY 90 tablet 1  . Omega-3 Fatty Acids (FISH OIL) 1000 MG CAPS Take 2 capsules (2,000 mg total) by mouth 2 (two) times daily.  0  . sitaGLIPtin (JANUVIA) 50 MG tablet Take 1 tablet (50 mg total) by mouth daily. 90 tablet 1   No facility-administered medications prior to visit.  Allergies:   Patient has no known allergies.   Social History   Social History  . Marital status: Legally Separated    Spouse name: N/A  . Number of children: N/A  . Years of education: N/A   Social History Main Topics  . Smoking status: Former Games developer  . Smokeless tobacco: Never Used     Comment: quit 40 years ago-40 pack year history  . Alcohol use Yes     Comment: drinks liquor on occassion.  . Drug use: No  . Sexual activity: Not Asked   Other Topics Concern  . None   Social History Narrative   Last updated: 02/13/2010   Married but separated from wife   Alcohol use-yes   Former Smoker quit 40 yrs ago (40 pack yr history)     Works part time at [employer] near [employer]     Family History:  The patient's   family history includes Melanoma in his brother.   ROS:   Please see the history of present illness.      Review of Systems  Constitution: Negative.  HENT: Positive for hearing loss.   Eyes: Positive for visual disturbance.  Cardiovascular: Negative.   Respiratory: Negative.   Endocrine: Negative.   Hematologic/Lymphatic: Bruises/bleeds easily.  Musculoskeletal: Negative.   Gastrointestinal: Negative.   Genitourinary: Negative.   Neurological: Negative.    All other systems reviewed and are negative.   PHYSICAL EXAM:   VS:  BP 112/60 (BP Location: Right Arm, Patient Position: Sitting, Cuff Size: Normal)   Pulse 62   Ht  (1.702 m)   Wt 211 lb 12.8 oz (96.1 kg)   SpO2 97%   BMI 33.17 kg/m   Physical Exam  GEN: Obese, in no acute distress Neck: no JVD, carotid bruits, or masses Cardiac:RRR; 1 to 2/6 systolic murmur at the left sternal border, no rubs, or gallops  Respiratory:  clear to auscultation bilaterally, normal work of breathing GI: soft, nontender, nondistended, + BS Ext: without cyanosis, clubbing, or edema, Good distal pulses bilaterally Psych: euthymic mood, full affect  Wt Readings from Last 3 Encounters:  08/03/16 211 lb 12.8 oz (96.1 kg)  06/17/16 210 lb (95.3 kg)  04/07/16 213 lb 12.8 oz (97 kg)      Studies/Labs Reviewed:   EKG:  EKG is not ordered today.    Recent Labs: 06/17/2016: BUN 48; Creatinine, Ser 2.22; Potassium 4.8; Sodium 140   Lipid Panel    Component Value Date/Time   CHOL 169 03/18/2016 0853   TRIG 193.0 (H) 03/18/2016 0853   HDL 38.60 (L) 03/18/2016 0853   CHOLHDL 4 03/18/2016 0853   VLDL 38.6 03/18/2016 0853   LDLCALC 92 03/18/2016 0853   LDLDIRECT 53.0 12/25/2014 0831    Additional studies/ records that were reviewed today include:   2-D echo 2015   Study Conclusions  - Left ventricle: The cavity size was normal. Wall thickness was   increased in a pattern of mild LVH. Systolic function was normal.   The estimated ejection fraction was in the range of 60% to 65%.   Doppler parameters are consistent with elevated mean  left atrial   filling pressure. - Left atrium: The atrium was moderately dilated.    ASSESSMENT:    1. Atherosclerosis of native coronary artery of native heart without angina pectoris   2. Essential hypertension   3. Paroxysmal atrial fibrillation (HCC)   4. Class 1 obesity due to excess calories with serious  comorbidity and body mass index (BMI) of 33.0 to 33.9 in adult   5. HYPERLIPIDEMIA   6. Stage 3 chronic kidney disease      PLAN:  In order of problems listed above:  CAD status post CABG 2 and MVR in 2006 without angina. Follow-up with Dr. Eden Emms in one year.   Essential hypertension controlled   Paroxysmal atrial fibrillation has had 2 cardioversions in the past, last episode in 2011. In normal sinus rhythm now. Not on anticoagulation.   Obesity patient says he eats and drinks whatever he wants. He has no intention of changing. Discussed importance of heart healthy diet.  Hyperlipidemia recent lipid panel in November reviewed and triglycerides high. Patient's unwilling to changes. Continue Mevacor.   Stage III CK D. Last creatinine 1.94.       Medication Adjustments/Labs and Tests Ordered: Current medicines are reviewed at length with the patient today.  Concerns regarding medicines are outlined above.  Medication changes, Labs and Tests ordered today are listed in the Patient Instructions below. Patient Instructions  Medication Instructions:  Your physician recommends that you continue on your current medications as directed. Please refer to the Current Medication list given to you today.   Labwork: None ordered  Testing/Procedures: None ordered  Follow-Up: Your physician wants you to follow-up in: 1 year with Dr.Nishan You will receive a reminder letter in the mail two months in advance. If you don't receive a letter, please call our office to schedule the follow-up appointment.   Any Other Special Instructions Will Be Listed Below (If  Applicable).     If you need a refill on your cardiac medications before your next appointment, please call your pharmacy.      Signed, Jacolyn Reedy, PA-C  08/03/2016 1:18 PM    [facility] [address] [place], [place], [place]  [address] Phone: [phone]; Fax: [phone]

## 2016-08-03 NOTE — Patient Instructions (Signed)
Medication Instructions:  Your physician recommends that you continue on your current medications as directed. Please refer to the Current Medication list given to you today.   Labwork: None ordered  Testing/Procedures: None ordered  Follow-Up: Your physician wants you to follow-up in: 1 year with Dr. Nishan. You will receive a reminder letter in the mail two months in advance. If you don't receive a letter, please call our office to schedule the follow-up appointment.   Any Other Special Instructions Will Be Listed Below (If Applicable).     If you need a refill on your cardiac medications before your next appointment, please call your pharmacy.   

## 2016-08-04 ENCOUNTER — Telehealth: Payer: Self-pay | Admitting: *Deleted

## 2016-08-04 MED ORDER — FOLIC ACID 1 MG PO TABS: 1.0000 mg | ORAL_TABLET | Freq: Every day | ORAL | 5 refills | Status: DC

## 2016-08-04 NOTE — Telephone Encounter (Signed)
Received fax from [facility] requesting refill of Folic Acid. Refills sent.

## 2016-08-11 ENCOUNTER — Telehealth: Payer: Self-pay

## 2016-08-11 ENCOUNTER — Telehealth: Payer: Self-pay | Admitting: Family

## 2016-08-11 MED ORDER — SITAGLIPTIN PHOSPHATE 50 MG PO TABS: 50.0000 mg | ORAL_TABLET | Freq: Every day | ORAL | 1 refills | Status: DC

## 2016-08-11 MED ORDER — CARVEDILOL 6.25 MG PO TABS: ORAL_TABLET | ORAL | 1 refills | Status: DC

## 2016-08-11 MED ORDER — ALLOPURINOL 100 MG PO TABS: 100.0000 mg | ORAL_TABLET | Freq: Every day | ORAL | 1 refills | Status: DC

## 2016-08-11 MED ORDER — FUROSEMIDE 20 MG PO TABS: 20.0000 mg | ORAL_TABLET | Freq: Every day | ORAL | 1 refills | Status: DC

## 2016-08-11 MED ORDER — COLCHICINE 0.6 MG PO TABS: ORAL_TABLET | ORAL | 5 refills | Status: DC

## 2016-08-11 MED ORDER — LOVASTATIN 40 MG PO TABS: 40.0000 mg | ORAL_TABLET | Freq: Every day | ORAL | 1 refills | Status: DC

## 2016-08-11 NOTE — Telephone Encounter (Signed)
Relation to ZO:XWRU Call back number:  [phone] Pharmacy: [facility] - [place], [place] - [address]  Reason for call:  Patient requesting a refill allopurinol (ZYLOPRIM) 100 MG tablet and colchicine 0.6 MG tablet

## 2016-08-11 NOTE — Telephone Encounter (Signed)
PA initiated via Covermymeds; KEY: [id number]. Awaiting determination.

## 2016-08-11 NOTE — Telephone Encounter (Signed)
Received PA approval, med approved 05/13/2016 through 08/11/2017. [facility] informed of PA approval. PA notification letter sent for scanning.

## 2016-08-11 NOTE — Telephone Encounter (Signed)
Spoke with pt's daughter. She states the pharmacy has faxed [place] twice for below refills. Advised her I cannot see any previous fax requests but we will send refills today. Also advised her that per our refill history, pt should need new refills on:  Carvedilol, furosemide, lovastatin and januvia. Will send refills on those as well. Refills sent.

## 2016-08-17 NOTE — Telephone Encounter (Signed)
Error

## 2016-09-16 ENCOUNTER — Encounter: Payer: Self-pay | Admitting: Family

## 2016-09-16 ENCOUNTER — Ambulatory Visit (INDEPENDENT_AMBULATORY_CARE_PROVIDER_SITE_OTHER): Payer: Medicare Other | Admitting: Family

## 2016-09-16 VITALS — BP 112/53 | HR 59 | Temp 97.6°F | Resp 18 | Ht 67.0 in | Wt 202.8 lb

## 2016-09-16 DIAGNOSIS — M109 Gout, unspecified: Secondary | ICD-10-CM | POA: Diagnosis not present

## 2016-09-16 DIAGNOSIS — I10 Essential (primary) hypertension: Secondary | ICD-10-CM

## 2016-09-16 DIAGNOSIS — E118 Type 2 diabetes mellitus with unspecified complications: Secondary | ICD-10-CM

## 2016-09-16 DIAGNOSIS — N183 Chronic kidney disease, stage 3 unspecified: Secondary | ICD-10-CM

## 2016-09-16 DIAGNOSIS — E782 Mixed hyperlipidemia: Secondary | ICD-10-CM | POA: Diagnosis not present

## 2016-09-16 DIAGNOSIS — I2510 Atherosclerotic heart disease of native coronary artery without angina pectoris: Secondary | ICD-10-CM | POA: Diagnosis not present

## 2016-09-16 DIAGNOSIS — E785 Hyperlipidemia, unspecified: Secondary | ICD-10-CM

## 2016-09-16 DIAGNOSIS — E1122 Type 2 diabetes mellitus with diabetic chronic kidney disease: Secondary | ICD-10-CM | POA: Diagnosis not present

## 2016-09-16 LAB — BASIC METABOLIC PANEL
BUN: 43 mg/dL — ABNORMAL HIGH (ref 6–23)
CALCIUM: 10.1 mg/dL (ref 8.4–10.5)
CHLORIDE: 107 meq/L (ref 96–112)
CO2: 28 meq/L (ref 19–32)
Creatinine, Ser: 1.98 mg/dL — ABNORMAL HIGH (ref 0.40–1.50)
GFR: 34.33 mL/min — ABNORMAL LOW (ref >60.00)
Glucose, Bld: 117 mg/dL — ABNORMAL HIGH (ref 70–99)
POTASSIUM: 4.4 meq/L (ref 3.5–5.1)
SODIUM: 141 meq/L (ref 135–145)

## 2016-09-16 LAB — HEMOGLOBIN A1C: Hgb A1c MFr Bld: 6.2 % (ref 4.6–6.5)

## 2016-09-16 LAB — LIPID PANEL
CHOL/HDL RATIO: 4
Cholesterol: 151 mg/dL (ref 0–200)
HDL: 33.7 mg/dL — AB (ref >39.00)
LDL CALC: 80 mg/dL (ref 0–99)
NonHDL: 117.45
TRIGLYCERIDES: 187 mg/dL — AB (ref 0.0–149.0)
VLDL: 37.4 mg/dL (ref 0.0–40.0)

## 2016-09-16 LAB — URIC ACID: Uric Acid, Serum: 7.2 mg/dL (ref 4.0–7.8)

## 2016-09-16 NOTE — Patient Instructions (Signed)
Please complete lab work prior to leaving.   

## 2016-09-16 NOTE — Progress Notes (Signed)
Subjective:    Patient ID: Javier [name], male    DOB: 10/11/1929, 83 y.o.   MRN: [id number]  HPI  Javier [name] is an 83 yr old male who presents today for follow up.  1) DM2- currently maintained on januvia.  Reports sugars generally in the low 100's when he checks at home.  Denies hypoglycemia.   Lab Results  Component Value Date   HGBA1C 6.1 06/17/2016   HGBA1C 6.4 03/18/2016   HGBA1C 5.7 12/17/2015   Lab Results  Component Value Date   MICROALBUR <0.7 12/25/2014   LDLCALC 92 03/18/2016   CREATININE 2.22 (H) 06/17/2016   2) HTN- maintained on zestorectic, lasix, amlodipine.  BP Readings from Last 3 Encounters:  09/16/16 (!) 112/53  08/03/16 112/60  06/17/16 106/60   3) Hyperlipidemia- maintained on mevacor.   Lab Results  Component Value Date   CHOL 169 03/18/2016   HDL 38.60 (L) 03/18/2016   LDLCALC 92 03/18/2016   LDLDIRECT 53.0 12/25/2014   TRIG 193.0 (H) 03/18/2016   CHOLHDL 4 03/18/2016   4) CKD-   Reports that he had a gout episode 1 month ago after eating shrimp and having "a couple beers."       Review of Systems  See HPI  Past Medical History:  Diagnosis Date  . Atrial fibrillation (HCC)   . Bronchitis   . CAD (coronary artery disease)   . Chronic renal insufficiency   . Congestive heart failure (HCC)   . Diabetes mellitus type II   . GERD (gastroesophageal reflux disease)   . Gout   . Hyperlipidemia   . Hypertension   . Leg cramps   . Osteoarthritis   . Shingles      Social History   Social History  . Marital status: Legally Separated    Spouse name: N/A  . Number of children: N/A  . Years of education: N/A   Occupational History  . Not on file.   Social History Main Topics  . Smoking status: Former Games developer  . Smokeless tobacco: Never Used     Comment: quit 40 years ago-40 pack year history  . Alcohol use Yes     Comment: drinks liquor on occassion.  . Drug use: No  . Sexual activity: Not on file   Other Topics Concern    . 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 CARDIOVERSION  11/02/2005   s/p  . CORONARY ARTERY BYPASS GRAFT  05/18/2005  . EYE SURGERY     CATARACT SX 06/2015 both eyes Dr.Beavis per pt  . MITRAL VALVE REPAIR  04/2005   s/p mitral valve repair    Family History  Problem Relation Age of Onset  . Diabetes Unknown        siblings  . Melanoma Brother        died at 46    No Known Allergies  Current Outpatient Prescriptions on File Prior to Visit  Medication Sig Dispense Refill  . allopurinol (ZYLOPRIM) 100 MG tablet Take 1 tablet (100 mg total) by mouth daily. 90 tablet 1  . amLODipine (NORVASC) 10 MG tablet TAKE 1 TABLET BY MOUTH DAILY 90 tablet 1  .  aspirin 81 MG tablet Take 81 mg by mouth daily.      . carvedilol (COREG) 6.25 MG tablet TAKE 1 TABLET BY MOUTH 2 TIMES DAILY WITH MEALS 180 tablet 1  . colchicine 0.6 MG tablet 2 tabs by mouth at start of gout flare.  Then one tablet 1 hour later. 6 tablet 5  . folic acid (FOLVITE) 1 MG tablet Take 1 tablet (1 mg total) by mouth daily. 30 tablet 5  . furosemide (LASIX) 20 MG tablet Take 1 tablet (20 mg total) by mouth daily. 90 tablet 1  . lisinopril-hydrochlorothiazide (PRINZIDE,ZESTORETIC) 20-25 MG tablet Take 1 tablet by mouth daily. 90 tablet 1  . lovastatin (MEVACOR) 40 MG tablet Take 1 tablet (40 mg total) by mouth daily. 90 tablet 1  . Omega-3 Fatty Acids (FISH OIL) 1000 MG CAPS Take 2 capsules (2,000 mg total) by mouth 2 (two) times daily.  0  . sitaGLIPtin (JANUVIA) 50 MG tablet Take 1 tablet (50 mg total) by mouth daily. 90 tablet 1   No current facility-administered medications on file prior to visit.     BP (!) 112/53 (BP Location: Right Arm, Cuff Size: Normal)   Pulse  (!) 59   Temp 97.6 F (36.4 C) (Oral)   Resp 18   Ht 5\' 7"  (1.702 m)   Wt 202 lb 12.8 oz (92 kg)   SpO2 98%   BMI 31.76 kg/m        Objective:   Physical Exam  Constitutional: He is oriented to person, place, and time. He appears well-developed and well-nourished. No distress.  HENT:  Head: Normocephalic and atraumatic.  Cardiovascular: Normal rate and regular rhythm.   No murmur heard. Pulmonary/Chest: Effort normal and breath sounds normal. No respiratory distress. He has no wheezes. He has no rales.  Musculoskeletal:  1-2+ bilateral LE edema  Neurological: He is alert and oriented to person, place, and time.  Skin: Skin is warm and dry.  Psychiatric: He has a normal mood and affect. His behavior is normal. Thought content normal.          Assessment & Plan:

## 2016-09-16 NOTE — Assessment & Plan Note (Signed)
Stable on statin, continue same.  

## 2016-09-16 NOTE — Assessment & Plan Note (Signed)
BP stable, continue current meds. 

## 2016-09-16 NOTE — Assessment & Plan Note (Signed)
Patient believes he is up to date with nephrology. Will request most recent OV note.

## 2016-09-16 NOTE — Assessment & Plan Note (Signed)
Stable on januvia. Obtain follow up A1C.

## 2016-09-17 ENCOUNTER — Encounter: Payer: Self-pay | Admitting: Family

## 2016-11-02 ENCOUNTER — Other Ambulatory Visit: Payer: Self-pay | Admitting: Family

## 2016-11-10 DIAGNOSIS — H4053X2 Glaucoma secondary to other eye disorders, bilateral, moderate stage: Secondary | ICD-10-CM | POA: Diagnosis not present

## 2016-11-10 DIAGNOSIS — H5202 Hypermetropia, left eye: Secondary | ICD-10-CM | POA: Diagnosis not present

## 2016-11-10 DIAGNOSIS — H40033 Anatomical narrow angle, bilateral: Secondary | ICD-10-CM | POA: Diagnosis not present

## 2016-11-10 DIAGNOSIS — H26493 Other secondary cataract, bilateral: Secondary | ICD-10-CM | POA: Diagnosis not present

## 2016-12-01 DIAGNOSIS — H26491 Other secondary cataract, right eye: Secondary | ICD-10-CM | POA: Diagnosis not present

## 2016-12-09 DIAGNOSIS — H26492 Other secondary cataract, left eye: Secondary | ICD-10-CM | POA: Diagnosis not present

## 2016-12-09 HISTORY — PX: EYE SURGERY: SHX253

## 2016-12-16 ENCOUNTER — Other Ambulatory Visit: Payer: Self-pay | Admitting: Family

## 2016-12-22 ENCOUNTER — Ambulatory Visit: Payer: Medicare Other | Admitting: Family

## 2016-12-29 ENCOUNTER — Encounter: Payer: Self-pay | Admitting: Family

## 2016-12-29 ENCOUNTER — Ambulatory Visit (INDEPENDENT_AMBULATORY_CARE_PROVIDER_SITE_OTHER): Payer: Medicare Other | Admitting: Family

## 2016-12-29 VITALS — BP 117/53 | HR 55 | Temp 97.6°F | Resp 16 | Ht 67.0 in | Wt 196.8 lb

## 2016-12-29 DIAGNOSIS — I10 Essential (primary) hypertension: Secondary | ICD-10-CM

## 2016-12-29 DIAGNOSIS — I2510 Atherosclerotic heart disease of native coronary artery without angina pectoris: Secondary | ICD-10-CM | POA: Diagnosis not present

## 2016-12-29 DIAGNOSIS — Z23 Encounter for immunization: Secondary | ICD-10-CM

## 2016-12-29 DIAGNOSIS — N183 Chronic kidney disease, stage 3 unspecified: Secondary | ICD-10-CM

## 2016-12-29 DIAGNOSIS — E782 Mixed hyperlipidemia: Secondary | ICD-10-CM | POA: Diagnosis not present

## 2016-12-29 DIAGNOSIS — E118 Type 2 diabetes mellitus with unspecified complications: Secondary | ICD-10-CM

## 2016-12-29 LAB — COMPREHENSIVE METABOLIC PANEL
ALBUMIN: 4.4 g/dL (ref 3.5–5.2)
ALT: 12 U/L (ref 0–53)
AST: 14 U/L (ref 0–37)
Alkaline Phosphatase: 59 U/L (ref 39–117)
BUN: 52 mg/dL — ABNORMAL HIGH (ref 6–23)
CALCIUM: 9.8 mg/dL (ref 8.4–10.5)
CHLORIDE: 107 meq/L (ref 96–112)
CO2: 27 mEq/L (ref 19–32)
CREATININE: 2.16 mg/dL — AB (ref 0.40–1.50)
GFR: 31.03 mL/min — ABNORMAL LOW (ref >60.00)
Glucose, Bld: 104 mg/dL — ABNORMAL HIGH (ref 70–99)
POTASSIUM: 4.2 meq/L (ref 3.5–5.1)
Sodium: 143 mEq/L (ref 135–145)
Total Bilirubin: 0.7 mg/dL (ref 0.2–1.2)
Total Protein: 7.5 g/dL (ref 6.0–8.3)

## 2016-12-29 LAB — HEMOGLOBIN A1C: Hgb A1c MFr Bld: 5.8 % (ref 4.6–6.5)

## 2016-12-29 MED ORDER — CARVEDILOL 6.25 MG PO TABS: ORAL_TABLET | ORAL | 1 refills | Status: DC

## 2016-12-29 MED ORDER — FUROSEMIDE 20 MG PO TABS: 20.0000 mg | ORAL_TABLET | Freq: Every day | ORAL | 1 refills | Status: DC

## 2016-12-29 MED ORDER — LISINOPRIL-HYDROCHLOROTHIAZIDE 20-25 MG PO TABS: 1.0000 | ORAL_TABLET | Freq: Every day | ORAL | 1 refills | Status: DC

## 2016-12-29 MED ORDER — ALLOPURINOL 100 MG PO TABS: 100.0000 mg | ORAL_TABLET | Freq: Every day | ORAL | 1 refills | Status: DC

## 2016-12-29 MED ORDER — LOVASTATIN 40 MG PO TABS: 40.0000 mg | ORAL_TABLET | Freq: Every day | ORAL | 1 refills | Status: DC

## 2016-12-29 MED ORDER — SITAGLIPTIN PHOSPHATE 50 MG PO TABS: 50.0000 mg | ORAL_TABLET | Freq: Every day | ORAL | 0 refills | Status: DC

## 2016-12-29 NOTE — Assessment & Plan Note (Signed)
Stable on januvia, continue same. Obtain A1c

## 2016-12-29 NOTE — Assessment & Plan Note (Signed)
At goal. Continue current meds.  

## 2016-12-29 NOTE — Progress Notes (Signed)
Subjective:    Patient ID: Javier [name], male    DOB: 1929/09/01, 83 y.o.   MRN: [id number]  HPI  Javier [name] is an 83 yr old male who presents today for follow up.  1) DM2- He continues [place].  Does not check his sugars.   Lab Results  Component Value Date   HGBA1C 6.2 09/16/2016   HGBA1C 6.1 06/17/2016   HGBA1C 6.4 03/18/2016   Lab Results  Component Value Date   MICROALBUR <0.7 12/25/2014   LDLCALC 80 09/16/2016   CREATININE 1.98 (H) 09/16/2016   2) HTN- maintained on amlodipine, carvedilol, zestoretic.  BP Readings from Last 3 Encounters:  12/29/16 (!) 117/53  09/16/16 (!) 112/53  08/03/16 112/60   3) Hyperlipidemia- maintained on mevacor.    Lab Results  Component Value Date   CHOL 151 09/16/2016   HDL 33.70 (L) 09/16/2016   LDLCALC 80 09/16/2016   LDLDIRECT 53.0 12/25/2014   TRIG 187.0 (H) 09/16/2016   CHOLHDL 4 09/16/2016   4) Gout- maintaind on allopurinol. Denies gout flare up x 2-3 months.    Review of Systems    see HPI  Past Medical History:  Diagnosis Date  . Atrial fibrillation (HCC)   . Bronchitis   . CAD (coronary artery disease)   . Chronic renal insufficiency   . Congestive heart failure (HCC)   . Diabetes mellitus type II   . GERD (gastroesophageal reflux disease)   . Gout   . Hyperlipidemia   . Hypertension   . Leg cramps   . Osteoarthritis   . Shingles      Social History   Social History  . Marital status: Legally Separated    Spouse name: N/A  . Number of children: N/A  . Years of education: N/A   Occupational History  . Not on file.   Social History Main Topics  . Smoking status: Former Games developer  . Smokeless tobacco: Never Used     Comment: quit 40 years ago-40 pack year history  . Alcohol use Yes     Comment: drinks liquor on occassion.  . Drug use: No  . Sexual activity: Not on file   Other Topics Concern  . 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 CARDIOVERSION  11/02/2005   s/p  . CORONARY ARTERY BYPASS GRAFT  05/18/2005  . EYE SURGERY     CATARACT SX 06/2015 both eyes Dr.Beavis per pt  . EYE SURGERY Bilateral 12/09/2016   revision of cataract surgery from 2017.  Marland Kitchen MITRAL VALVE REPAIR  04/2005   s/p mitral valve repair    Family History  Problem Relation Age of Onset  . Diabetes Unknown        siblings  . Melanoma Brother        died at 76    No Known Allergies  Current Outpatient Prescriptions on File Prior to Visit  Medication Sig Dispense Refill  . allopurinol (ZYLOPRIM) 100 MG tablet Take 1 tablet (100 mg total) by mouth daily. 90 tablet 1  . amLODipine (NORVASC) 10 MG tablet TAKE 1 TABLET BY MOUTH ONCE DAILY 90 tablet 1  . aspirin 81 MG tablet  Take 81 mg by mouth daily.      . carvedilol (COREG) 6.25 MG tablet TAKE 1 TABLET BY MOUTH 2 TIMES DAILY WITH MEALS 180 tablet 1  . colchicine 0.6 MG tablet 2 tabs by mouth at start of gout flare.  Then one tablet 1 hour later. 6 tablet 5  . folic acid (FOLVITE) 1 MG tablet Take 1 tablet (1 mg total) by mouth daily. 30 tablet 5  . furosemide (LASIX) 20 MG tablet Take 1 tablet (20 mg total) by mouth daily. 90 tablet 1  . JANUVIA 50 MG tablet TAKE 1 TABLET BY MOUTH DAILY 90 tablet 0  . lisinopril-hydrochlorothiazide (PRINZIDE,ZESTORETIC) 20-25 MG tablet Take 1 tablet by mouth daily. 90 tablet 1  . lovastatin (MEVACOR) 40 MG tablet Take 1 tablet (40 mg total) by mouth daily. 90 tablet 1  . Omega-3 Fatty Acids (FISH OIL) 1000 MG CAPS Take 2 capsules (2,000 mg total) by mouth 2 (two) times daily.  0   No current facility-administered medications on file prior to visit.     BP (!) 117/53 (BP Location: Left Arm, Cuff Size: Normal)   Pulse (!) 55   Temp 97.6 F (36.4 C) (Oral)   Resp  16   Ht  (1.702 m)   Wt 196 lb 12.8 oz (89.3 kg)   SpO2 99%   BMI 30.82 kg/m    Objective:   Physical Exam  Constitutional: He is oriented to person, place, and time. He appears well-developed and well-nourished. No distress.  HENT:  Head: Normocephalic and atraumatic.  Cardiovascular: Normal rate and regular rhythm.   No murmur heard. Pulmonary/Chest: Effort normal and breath sounds normal. No respiratory distress. He has no wheezes. He has no rales.  Musculoskeletal:  2+ bilateral LE edema  Neurological: He is alert and oriented to person, place, and time.  Skin: Skin is warm and dry.  Psychiatric: He has a normal mood and affect. His behavior is normal. Thought content normal.          Assessment & Plan:

## 2016-12-29 NOTE — Assessment & Plan Note (Signed)
Stable, obtain follow up Cr.

## 2016-12-29 NOTE — Assessment & Plan Note (Signed)
LDL at goal, continue statin. 

## 2016-12-29 NOTE — Patient Instructions (Signed)
Please complete lab work prior to leaving.   

## 2016-12-30 ENCOUNTER — Other Ambulatory Visit: Payer: Self-pay | Admitting: Family

## 2016-12-31 ENCOUNTER — Telehealth: Payer: Self-pay

## 2016-12-31 NOTE — Telephone Encounter (Signed)
Pt aware of lab results/also agrees to Charlina Dwight/C Januvia/thx dmf

## 2016-12-31 NOTE — Telephone Encounter (Signed)
-----   Message from Sandford CrazeMelissa O'Sullivan, NP sent at 12/30/2016  8:33 AM EDT ----- Sugar looks great.  I would like him to stop [place]ia.  Kidney function is stable.

## 2017-02-28 DIAGNOSIS — E860 Dehydration: Secondary | ICD-10-CM | POA: Diagnosis not present

## 2017-02-28 DIAGNOSIS — Z951 Presence of aortocoronary bypass graft: Secondary | ICD-10-CM | POA: Diagnosis not present

## 2017-02-28 DIAGNOSIS — I493 Ventricular premature depolarization: Secondary | ICD-10-CM | POA: Diagnosis not present

## 2017-02-28 DIAGNOSIS — N179 Acute kidney failure, unspecified: Secondary | ICD-10-CM | POA: Diagnosis not present

## 2017-02-28 DIAGNOSIS — I2510 Atherosclerotic heart disease of native coronary artery without angina pectoris: Secondary | ICD-10-CM | POA: Diagnosis not present

## 2017-02-28 DIAGNOSIS — A084 Viral intestinal infection, unspecified: Secondary | ICD-10-CM | POA: Diagnosis not present

## 2017-03-01 ENCOUNTER — Telehealth: Payer: Self-pay | Admitting: *Deleted

## 2017-03-01 NOTE — Telephone Encounter (Signed)
[facility] [place], stating, "Javier [name], Javier [name] received Emergency Treatment for Weakness or fatigue, unable to eat, loss of appetite and was given the diagnosis of Dehydration, Viral diarrhea. Patient's discharge/follow-up instructions are to follow-up with Melissa for next available appt and repeat BMET"; forwarded to provider/SLS 11/12  Please call patient for ED f/[facility] appointment [facility], if available within this week. Thanks/SLS 11/12

## 2017-03-01 NOTE — Telephone Encounter (Signed)
Patient scheduled with PCP for 03/08/2017 at 10:40am.

## 2017-03-07 ENCOUNTER — Telehealth: Payer: Self-pay | Admitting: Family

## 2017-03-07 NOTE — Progress Notes (Signed)
Subjective:    Patient ID: Javier [name], male    DOB: April 02, 1930, 83 y.o.   MRN: [id number]  HPI  Mr. Javier [name] is an 83 year old male who presents today for ER follow up of diarrhea.  He was evaluated in the ED at [facility] on 02/28/17 due to N/V/D and weakness which had been present for several days prior. Troponin was negative. Cr was elevated at 1.9.  (this is pt's baseline). WBC was normal, hgb was mildly low at 12.6  Pt reports feeling immediately better after he had IVF in the ED. Has had no further symptoms since returning home. "I think I picked up a bug at the flea market."   Lab Results  Component Value Date   CREATININE 2.16 (H) 12/29/2016      Review of Systems  Constitutional: Negative for fever.  Gastrointestinal: Negative for abdominal pain, constipation, diarrhea and nausea.   Past Medical History:  Diagnosis Date  . Atrial fibrillation (HCC)   . Bronchitis   . CAD (coronary artery disease)   . Chronic renal insufficiency   . Congestive heart failure (HCC)   . Diabetes mellitus type II   . GERD (gastroesophageal reflux disease)   . Gout   . Hyperlipidemia   . Hypertension   . Leg cramps   . Osteoarthritis   . Shingles      Social History   Socioeconomic History  . Marital status: Legally Separated    Spouse name: Not on file  . Number of children: Not on file  . Years of education: Not on file  . Highest education level: Not on file  Social Needs  . Financial resource strain: Not on file  . Food insecurity - worry: Not on file  . Food insecurity - inability: Not on file  . Transportation needs - medical: Not on file  . Transportation needs - non-medical: Not on file  Occupational History  . Not on file  Tobacco Use  . Smoking status: Former Games developermoker  . Smokeless tobacco: Never Used  . Tobacco comment: quit 40 years ago-40 pack year history  Substance and Sexual Activity  . Alcohol use: Yes    Comment: drinks liquor on occassion.  .  Drug use: No  . Sexual activity: Not on file  Other Topics Concern  . Not on file  Social History Narrative   Last updated: 02/13/2010   Married but separated from wife   Alcohol use-yes   Former Smoker quit 40 yrs ago (40 pack yr history)     Works part time at [employer] [place] [employer]    Past Surgical History:  Procedure Laterality Date  . CARDIOVERSION  11/02/2005   s/p  . CORONARY ARTERY BYPASS GRAFT  05/18/2005  . EYE SURGERY     CATARACT SX 06/2015 both eyes Dr.Beavis per pt  . EYE SURGERY Bilateral 12/09/2016   revision of cataract surgery from 2017.  Marland Kitchen. MITRAL VALVE REPAIR  04/2005   s/p mitral valve repair    Family History  Problem Relation Age of Onset  . Diabetes Unknown        siblings  . Melanoma Brother        died at 1491    No Known Allergies  Current Outpatient Medications on File Prior to Visit  Medication Sig Dispense Refill  . allopurinol (ZYLOPRIM) 100 MG tablet Take 1 tablet (100 mg total) by mouth daily. 90 tablet 1  . amLODipine (NORVASC)  10 MG tablet TAKE 1 TABLET BY MOUTH ONCE DAILY 90 tablet 1  . aspirin 81 MG tablet Take 81 mg by mouth daily.      . carvedilol (COREG) 6.25 MG tablet TAKE 1 TABLET BY MOUTH 2 TIMES DAILY WITH MEALS 180 tablet 1  . colchicine 0.6 MG tablet 2 tabs by mouth at start of gout flare.  Then one tablet 1 hour later. 6 tablet 5  . folic acid (FOLVITE) 1 MG tablet Take 1 tablet (1 mg total) by mouth daily. 30 tablet 5  . furosemide (LASIX) 20 MG tablet Take 1 tablet (20 mg total) by mouth daily. 90 tablet 1  . lisinopril-hydrochlorothiazide (PRINZIDE,ZESTORETIC) 20-25 MG tablet Take 1 tablet by mouth daily. 90 tablet 1  . lovastatin (MEVACOR) 40 MG tablet Take 1 tablet (40 mg total) by mouth daily. 90 tablet 1  . Omega-3 Fatty Acids (FISH OIL) 1000 MG CAPS Take 2 capsules (2,000 mg total) by mouth 2 (two) times daily.  0   No current facility-administered medications on file prior to visit.     BP 126/66  (BP Location: Right Arm, Cuff Size: Large)   Pulse (!) 59   Temp 97.9 F (36.6 C) (Oral)   Resp 16   Ht 5\' 7"  (1.702 m)   Wt 200 lb 3.2 oz (90.8 kg)   SpO2 96%   BMI 31.36 kg/m       Objective:   Physical Exam  Constitutional: He is oriented to person, place, and time. He appears well-developed and well-nourished. No distress.  HENT:  Head: Normocephalic and atraumatic.  Cardiovascular: Normal rate and regular rhythm.  No murmur heard. Pulmonary/Chest: Effort normal and breath sounds normal. No respiratory distress. He has no wheezes. He has no rales.  Abdominal: Soft. Bowel sounds are normal. He exhibits no distension. There is no tenderness. There is no rebound.  Musculoskeletal: He exhibits no edema.  Neurological: He is alert and oriented to person, place, and time.  Skin: Skin is warm and dry.  Psychiatric: He has a normal mood and affect. His behavior is normal. Thought content normal.          Assessment & Plan:  Viral gastroenteritis- clinically resolved.  Anemia- check follow up CBC.  Chronic renal insufficiency- check follow up CMET.

## 2017-03-07 NOTE — Telephone Encounter (Signed)
Please call pt and cancel appointment for 11/19 as I don't need to see him for 3 months after his last appointment.

## 2017-03-08 ENCOUNTER — Ambulatory Visit (INDEPENDENT_AMBULATORY_CARE_PROVIDER_SITE_OTHER): Payer: Medicare Other | Admitting: Family

## 2017-03-08 ENCOUNTER — Encounter: Payer: Self-pay | Admitting: Family

## 2017-03-08 VITALS — BP 126/66 | HR 59 | Temp 97.9°F | Resp 16 | Ht 67.0 in | Wt 200.2 lb

## 2017-03-08 DIAGNOSIS — D649 Anemia, unspecified: Secondary | ICD-10-CM | POA: Diagnosis not present

## 2017-03-08 DIAGNOSIS — A084 Viral intestinal infection, unspecified: Secondary | ICD-10-CM

## 2017-03-08 DIAGNOSIS — N189 Chronic kidney disease, unspecified: Secondary | ICD-10-CM

## 2017-03-08 DIAGNOSIS — I2510 Atherosclerotic heart disease of native coronary artery without angina pectoris: Secondary | ICD-10-CM | POA: Diagnosis not present

## 2017-03-08 DIAGNOSIS — R197 Diarrhea, unspecified: Secondary | ICD-10-CM

## 2017-03-08 LAB — CBC WITH DIFFERENTIAL/PLATELET
BASOS PCT: 0.8 % (ref 0.0–3.0)
Basophils Absolute: 0.1 10*3/uL (ref 0.0–0.1)
EOS PCT: 7.2 % — AB (ref 0.0–5.0)
Eosinophils Absolute: 0.4 10*3/uL (ref 0.0–0.7)
HCT: 34 % — ABNORMAL LOW (ref 39.0–52.0)
Hemoglobin: 11.4 g/dL — ABNORMAL LOW (ref 13.0–17.0)
LYMPHS ABS: 1.2 10*3/uL (ref 0.7–4.0)
Lymphocytes Relative: 18.7 % (ref 12.0–46.0)
MCHC: 33.6 g/dL (ref 30.0–36.0)
MCV: 100.5 fl — ABNORMAL HIGH (ref 78.0–100.0)
MONO ABS: 0.5 10*3/uL (ref 0.1–1.0)
Monocytes Relative: 8.3 % (ref 3.0–12.0)
NEUTROS ABS: 4 10*3/uL (ref 1.4–7.7)
NEUTROS PCT: 65 % (ref 43.0–77.0)
PLATELETS: 183 10*3/uL (ref 150.0–400.0)
RBC: 3.38 Mil/uL — ABNORMAL LOW (ref 4.22–5.81)
RDW: 14.7 % (ref 11.5–15.5)
WBC: 6.2 10*3/uL (ref 4.0–10.5)

## 2017-03-08 LAB — COMPREHENSIVE METABOLIC PANEL
ALT: 10 U/L (ref 0–53)
AST: 15 U/L (ref 0–37)
Albumin: 3.6 g/dL (ref 3.5–5.2)
Alkaline Phosphatase: 76 U/L (ref 39–117)
BUN: 19 mg/dL (ref 6–23)
CHLORIDE: 110 meq/L (ref 96–112)
CO2: 25 mEq/L (ref 19–32)
Calcium: 9.2 mg/dL (ref 8.4–10.5)
Creatinine, Ser: 1.31 mg/dL (ref 0.40–1.50)
GFR: 55.24 mL/min — ABNORMAL LOW (ref >60.00)
GLUCOSE: 96 mg/dL (ref 70–99)
POTASSIUM: 4.1 meq/L (ref 3.5–5.1)
SODIUM: 142 meq/L (ref 135–145)
Total Bilirubin: 0.6 mg/dL (ref 0.2–1.2)
Total Protein: 6.7 g/dL (ref 6.0–8.3)

## 2017-03-08 NOTE — Telephone Encounter (Signed)
Records were received and review with pt at [facility] today.

## 2017-03-08 NOTE — Telephone Encounter (Signed)
Chart reviewed. Pt actually had ER visit and this is an ER follow up.

## 2017-03-08 NOTE — Telephone Encounter (Signed)
Records [facility] to [facility] [place].

## 2017-03-08 NOTE — Patient Instructions (Signed)
Please complete lab work prior to leaving. Follow up in January as scheduled.

## 2017-03-09 ENCOUNTER — Other Ambulatory Visit (INDEPENDENT_AMBULATORY_CARE_PROVIDER_SITE_OTHER): Payer: Medicare Other

## 2017-03-09 DIAGNOSIS — D649 Anemia, unspecified: Secondary | ICD-10-CM

## 2017-03-09 LAB — VITAMIN B12: Vitamin B-12: 294 pg/mL (ref 211–911)

## 2017-03-09 LAB — FOLATE: Folate: 16.9 ng/mL (ref >5.9)

## 2017-05-03 ENCOUNTER — Ambulatory Visit: Payer: Medicare Other | Admitting: Family

## 2017-05-04 ENCOUNTER — Encounter: Payer: Self-pay | Admitting: Family

## 2017-05-04 ENCOUNTER — Ambulatory Visit (INDEPENDENT_AMBULATORY_CARE_PROVIDER_SITE_OTHER): Payer: Medicare Other | Admitting: Family

## 2017-05-04 VITALS — BP 118/60 | HR 57 | Temp 97.5°F | Resp 18 | Ht 67.0 in | Wt 195.0 lb

## 2017-05-04 DIAGNOSIS — E785 Hyperlipidemia, unspecified: Secondary | ICD-10-CM

## 2017-05-04 DIAGNOSIS — E119 Type 2 diabetes mellitus without complications: Secondary | ICD-10-CM

## 2017-05-04 DIAGNOSIS — I10 Essential (primary) hypertension: Secondary | ICD-10-CM | POA: Diagnosis not present

## 2017-05-04 LAB — COMPREHENSIVE METABOLIC PANEL
ALBUMIN: 4.3 g/dL (ref 3.5–5.2)
ALT: 10 U/L (ref 0–53)
AST: 12 U/L (ref 0–37)
Alkaline Phosphatase: 69 U/L (ref 39–117)
BUN: 49 mg/dL — AB (ref 6–23)
CHLORIDE: 105 meq/L (ref 96–112)
CO2: 28 mEq/L (ref 19–32)
Calcium: 9.7 mg/dL (ref 8.4–10.5)
Creatinine, Ser: 1.74 mg/dL — ABNORMAL HIGH (ref 0.40–1.50)
GFR: 39.8 mL/min — ABNORMAL LOW (ref >60.00)
Glucose, Bld: 109 mg/dL — ABNORMAL HIGH (ref 70–99)
POTASSIUM: 4.3 meq/L (ref 3.5–5.1)
SODIUM: 141 meq/L (ref 135–145)
Total Bilirubin: 0.7 mg/dL (ref 0.2–1.2)
Total Protein: 7.6 g/dL (ref 6.0–8.3)

## 2017-05-04 LAB — LIPID PANEL
CHOLESTEROL: 152 mg/dL (ref 0–200)
HDL: 39.2 mg/dL (ref >39.00)
LDL Cholesterol: 92 mg/dL (ref 0–99)
NONHDL: 113.09
Total CHOL/HDL Ratio: 4
Triglycerides: 106 mg/dL (ref 0.0–149.0)
VLDL: 21.2 mg/dL (ref 0.0–40.0)

## 2017-05-04 LAB — HEMOGLOBIN A1C: HEMOGLOBIN A1C: 6.1 % (ref 4.6–6.5)

## 2017-05-04 NOTE — Progress Notes (Signed)
Subjective:    Patient ID: Javier [name], male    DOB: 1929-08-03, 84 y.o.   MRN: [id number]  HPI   Last visit we stopped [place]ia.  Reports sugars have been well controlled.    Lab Results  Component Value Date   HGBA1C 5.8 12/29/2016   HGBA1C 6.2 09/16/2016   HGBA1C 6.1 06/17/2016   Lab Results  Component Value Date   MICROALBUR <0.7 12/25/2014   LDLCALC 80 09/16/2016   CREATININE 1.31 03/08/2017    HTN- tolerating medications without difficulty.  BP Readings from Last 3 Encounters:  05/04/17 118/60  03/08/17 126/66  12/29/16 (!) 117/53    Hyperlipidemia-maintained on mevacor.  Lab Results  Component Value Date   CHOL 151 09/16/2016   HDL 33.70 (L) 09/16/2016   LDLCALC 80 09/16/2016   LDLDIRECT 53.0 12/25/2014   TRIG 187.0 (H) 09/16/2016   CHOLHDL 4 09/16/2016    Review of Systems    see HPI  Past Medical History:  Diagnosis Date  . Atrial fibrillation (HCC)   . Bronchitis   . CAD (coronary artery disease)   . Chronic renal insufficiency   . Congestive heart failure (HCC)   . Diabetes mellitus type II   . GERD (gastroesophageal reflux disease)   . Gout   . Hyperlipidemia   . Hypertension   . Leg cramps   . Osteoarthritis   . Shingles      Social History   Socioeconomic History  . Marital status: Legally Separated    Spouse name: Not on file  . Number of children: Not on file  . Years of education: Not on file  . Highest education level: Not on file  Social Needs  . Financial resource strain: Not on file  . Food insecurity - worry: Not on file  . Food insecurity - inability: Not on file  . Transportation needs - medical: Not on file  . Transportation needs - non-medical: Not on file  Occupational History  . Not on file  Tobacco Use  . Smoking status: Former Games developermoker  . Smokeless tobacco: Never Used  . Tobacco comment: quit 40 years ago-40 pack year history  Substance and Sexual Activity  . Alcohol use: Yes    Comment: drinks liquor on  occassion.  . Drug use: No  . Sexual activity: Not on file  Other Topics Concern  . Not on file  Social History Narrative   Last updated: 02/13/2010   Married but separated from wife   Alcohol use-yes   Former Smoker quit 40 yrs ago (40 pack yr history)     Works part time at [employer], [employer] store [place] [employer]    Past Surgical History:  Procedure Laterality Date  . CARDIOVERSION  11/02/2005   s/p  . CORONARY ARTERY BYPASS GRAFT  05/18/2005  . EYE SURGERY     CATARACT SX 06/2015 both eyes Dr.Beavis per pt  . EYE SURGERY Bilateral 12/09/2016   revision of cataract surgery from 2017.  Marland Kitchen. MITRAL VALVE REPAIR  04/2005   s/p mitral valve repair    Family History  Problem Relation Age of Onset  . Diabetes Unknown        siblings  . Melanoma Brother        died at 9691    No Known Allergies  Current Outpatient Medications on File Prior to Visit  Medication Sig Dispense Refill  . allopurinol (ZYLOPRIM) 100 MG tablet Take 1 tablet (100 mg total) by mouth daily.  90 tablet 1  . amLODipine (NORVASC) 10 MG tablet TAKE 1 TABLET BY MOUTH ONCE DAILY 90 tablet 1  . aspirin 81 MG tablet Take 81 mg by mouth daily.      . carvedilol (COREG) 6.25 MG tablet TAKE 1 TABLET BY MOUTH 2 TIMES DAILY WITH MEALS 180 tablet 1  . colchicine 0.6 MG tablet 2 tabs by mouth at start of gout flare.  Then one tablet 1 hour later. 6 tablet 5  . folic acid (FOLVITE) 1 MG tablet Take 1 tablet (1 mg total) by mouth daily. 30 tablet 5  . furosemide (LASIX) 20 MG tablet Take 1 tablet (20 mg total) by mouth daily. 90 tablet 1  . lisinopril-hydrochlorothiazide (PRINZIDE,ZESTORETIC) 20-25 MG tablet Take 1 tablet by mouth daily. 90 tablet 1  . lovastatin (MEVACOR) 40 MG tablet Take 1 tablet (40 mg total) by mouth daily. 90 tablet 1  . Omega-3 Fatty Acids (FISH OIL) 1000 MG CAPS Take 2 capsules (2,000 mg total) by mouth 2 (two) times daily.  0   No current facility-administered medications on file prior to visit.      BP 118/60 (BP Location: Right Arm, Patient Position: Sitting)   Pulse (!) 57   Temp (!) 97.5 F (36.4 C) (Oral)   Resp 18   Ht 5\' 7"  (1.702 m)   Wt 195 lb (88.5 kg)   SpO2 99%   BMI 30.54 kg/m    Objective:   Physical Exam  Constitutional: He is oriented to person, place, and time. He appears well-developed and well-nourished. No distress.  HENT:  Head: Normocephalic and atraumatic.  Cardiovascular: Normal rate and regular rhythm.  No murmur heard. Pulmonary/Chest: Effort normal and breath sounds normal. No respiratory distress. He has no wheezes. He has no rales.  Musculoskeletal: He exhibits no edema.  Neurological: He is alert and oriented to person, place, and time.  Skin: Skin is warm and dry.  Psychiatric: He has a normal mood and affect. His behavior is normal. Thought content normal.          Assessment & Plan:  DM2- clinically stable off of meds. Obtain follow up cmet, a1c.  HTN- BP stable on current meds continue same,  Hyperlipidemia- obtain follow up lipid panel. Continue mevacor.

## 2017-05-04 NOTE — Patient Instructions (Signed)
Please complete lab work prior to leaving.   

## 2017-05-06 ENCOUNTER — Encounter: Payer: Self-pay | Admitting: Family

## 2017-05-12 DIAGNOSIS — Z961 Presence of intraocular lens: Secondary | ICD-10-CM | POA: Diagnosis not present

## 2017-05-12 DIAGNOSIS — E119 Type 2 diabetes mellitus without complications: Secondary | ICD-10-CM | POA: Diagnosis not present

## 2017-05-12 DIAGNOSIS — H4053X2 Glaucoma secondary to other eye disorders, bilateral, moderate stage: Secondary | ICD-10-CM | POA: Diagnosis not present

## 2017-05-26 ENCOUNTER — Other Ambulatory Visit: Payer: Self-pay | Admitting: Family

## 2017-06-14 DIAGNOSIS — R197 Diarrhea, unspecified: Secondary | ICD-10-CM | POA: Diagnosis not present

## 2017-06-14 DIAGNOSIS — A419 Sepsis, unspecified organism: Secondary | ICD-10-CM | POA: Diagnosis not present

## 2017-06-14 DIAGNOSIS — R001 Bradycardia, unspecified: Secondary | ICD-10-CM | POA: Diagnosis not present

## 2017-06-14 DIAGNOSIS — I959 Hypotension, unspecified: Secondary | ICD-10-CM | POA: Diagnosis not present

## 2017-06-14 DIAGNOSIS — E872 Acidosis: Secondary | ICD-10-CM | POA: Diagnosis not present

## 2017-06-14 DIAGNOSIS — N179 Acute kidney failure, unspecified: Secondary | ICD-10-CM | POA: Diagnosis not present

## 2017-08-19 ENCOUNTER — Other Ambulatory Visit: Payer: Self-pay | Admitting: Family

## 2017-08-20 NOTE — Telephone Encounter (Signed)
Received refill request for lovastatin, lisinopril-hctz, furosemide, carvedilol, allopurinol. Last office visit 05/04/17 and last refill 12/29/2016. Refills sent to patient's pharmacy.

## 2017-09-07 ENCOUNTER — Encounter: Payer: Self-pay | Admitting: Family

## 2017-09-07 ENCOUNTER — Ambulatory Visit (INDEPENDENT_AMBULATORY_CARE_PROVIDER_SITE_OTHER): Payer: Medicare Other | Admitting: Family

## 2017-09-07 VITALS — BP 112/60 | HR 56 | Temp 97.6°F | Resp 16 | Ht 66.0 in | Wt 187.4 lb

## 2017-09-07 DIAGNOSIS — I10 Essential (primary) hypertension: Secondary | ICD-10-CM | POA: Diagnosis not present

## 2017-09-07 DIAGNOSIS — M109 Gout, unspecified: Secondary | ICD-10-CM | POA: Diagnosis not present

## 2017-09-07 DIAGNOSIS — E119 Type 2 diabetes mellitus without complications: Secondary | ICD-10-CM

## 2017-09-07 DIAGNOSIS — E785 Hyperlipidemia, unspecified: Secondary | ICD-10-CM

## 2017-09-07 LAB — HEMOGLOBIN A1C: HEMOGLOBIN A1C: 5.9 % (ref 4.6–6.5)

## 2017-09-07 LAB — BASIC METABOLIC PANEL
BUN: 58 mg/dL — ABNORMAL HIGH (ref 6–23)
CALCIUM: 9.4 mg/dL (ref 8.4–10.5)
CHLORIDE: 106 meq/L (ref 96–112)
CO2: 27 mEq/L (ref 19–32)
CREATININE: 1.86 mg/dL — AB (ref 0.40–1.50)
GFR: 36.82 mL/min — ABNORMAL LOW (ref >60.00)
Glucose, Bld: 114 mg/dL — ABNORMAL HIGH (ref 70–99)
Potassium: 4.6 mEq/L (ref 3.5–5.1)
SODIUM: 142 meq/L (ref 135–145)

## 2017-09-07 MED ORDER — AMLODIPINE BESYLATE 10 MG PO TABS: 10.0000 mg | ORAL_TABLET | Freq: Every day | ORAL | 1 refills | Status: DC

## 2017-09-07 NOTE — Progress Notes (Signed)
Subjective:    Patient ID: Javier [name], male    DOB: 1929/12/09, 84 y.o.   MRN: [id number]  HPI  Javier [name] is an 84 yr old male who presents today for follow up.  DM2- reports that he is much more active due to work and has lost weight. Not checking sugars.   Lab Results  Component Value Date   HGBA1C 6.1 05/04/2017   HGBA1C 5.8 12/29/2016   HGBA1C 6.2 09/16/2016   Lab Results  Component Value Date   MICROALBUR <0.7 12/25/2014   LDLCALC 92 05/04/2017   CREATININE 1.74 (H) 05/04/2017   Wt Readings from Last 3 Encounters:  09/07/17 187 lb 6.4 oz (85 kg)  05/04/17 195 lb (88.5 kg)  03/08/17 200 lb 3.2 oz (90.8 kg)   HTN- on amlodipine and coreg and lisinopril hctz.  BP Readings from Last 3 Encounters:  09/07/17 112/60  05/04/17 118/60  03/08/17 126/66   Hyperlipidemia- denies myalgia. Continues mevacor.  Lab Results  Component Value Date   CHOL 152 05/04/2017   HDL 39.20 05/04/2017   LDLCALC 92 05/04/2017   LDLDIRECT 53.0 12/25/2014   TRIG 106.0 05/04/2017   CHOLHDL 4 05/04/2017   Gout- continues allopurinol, denies recent gout flare.   Review of Systems    see HPI  Past Medical History:  Diagnosis Date  . Atrial fibrillation (HCC)   . Bronchitis   . CAD (coronary artery disease)   . Chronic renal insufficiency   . Congestive heart failure (HCC)   . Diabetes mellitus type II   . GERD (gastroesophageal reflux disease)   . Gout   . Hyperlipidemia   . Hypertension   . Leg cramps   . Osteoarthritis   . Shingles      Social History   Socioeconomic History  . Marital status: Legally Separated    Spouse name: Not on file  . Number of children: Not on file  . Years of education: Not on file  . Highest education level: Not on file  Occupational History  . Not on file  Social Needs  . Financial resource strain: Not on file  . Food insecurity:    Worry: Not on file    Inability: Not on file  . Transportation needs:    Medical: Not on file   Non-medical: Not on file  Tobacco Use  . Smoking status: Former Games developer  . Smokeless tobacco: Never Used  . Tobacco comment: quit 40 years ago-40 pack year history  Substance and Sexual Activity  . Alcohol use: Yes    Comment: drinks liquor on occassion.  . Drug use: No  . Sexual activity: Not on file  Lifestyle  . Physical activity:    Days per week: Not on file    Minutes per session: Not on file  . Stress: Not on file  Relationships  . Social connections:    Talks on phone: Not on file    Gets together: Not on file    Attends religious service: Not on file    Active member of club or organization: Not on file    Attends meetings of clubs or organizations: Not on file    Relationship status: Not on file  . Intimate partner violence:    Fear of current or ex partner: Not on file    Emotionally abused: Not on file    Physically abused: Not on file    Forced sexual activity: Not on file  Other Topics Concern  .  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 CARDIOVERSION  11/02/2005   s/p  . CORONARY ARTERY BYPASS GRAFT  05/18/2005  . EYE SURGERY     CATARACT SX 06/2015 both eyes Dr.Beavis per pt  . EYE SURGERY Bilateral 12/09/2016   revision of cataract surgery from 2017.  Marland Kitchen MITRAL VALVE REPAIR  04/2005   s/p mitral valve repair    Family History  Problem Relation Age of Onset  . Diabetes Unknown        siblings  . Melanoma Brother        died at 59    No Known Allergies  Current Outpatient Medications on File Prior to Visit  Medication Sig Dispense Refill  . allopurinol (ZYLOPRIM) 100 MG tablet TAKE 1 TABLET BY MOUTH DAILY 90 tablet 1  . amLODipine (NORVASC) 10 MG tablet TAKE 1 TABLET BY MOUTH ONCE DAILY 90 tablet 1    . aspirin 81 MG tablet Take 81 mg by mouth daily.      . carvedilol (COREG) 6.25 MG tablet TAKE 1 TABLET BY MOUTH 2 TIMES DAILY WITH MEALS 180 tablet 1  . colchicine 0.6 MG tablet 2 tabs by mouth at start of gout flare.  Then one tablet 1 hour later. 6 tablet 5  . folic acid (FOLVITE) 1 MG tablet Take 1 tablet (1 mg total) by mouth daily. 30 tablet 5  . furosemide (LASIX) 20 MG tablet TAKE 1 TABLET BY MOUTH DAILY 90 tablet 1  . lisinopril-hydrochlorothiazide (PRINZIDE,ZESTORETIC) 20-25 MG tablet TAKE 1 TABLET BY MOUTH DAILY 90 tablet 1  . lovastatin (MEVACOR) 40 MG tablet TAKE 1 TABLET BY MOUTH DAILY 90 tablet 1  . Omega-3 Fatty Acids (FISH OIL) 1000 MG CAPS Take 2 capsules (2,000 mg total) by mouth 2 (two) times daily.  0   No current facility-administered medications on file prior to visit.     BP 112/60 (BP Location: Right Arm, Patient Position: Sitting, Cuff Size: Small)   Pulse (!) 56   Temp 97.6 F (36.4 C) (Oral)   Resp 16   Ht  (1.676 m)   Wt 187 lb 6.4 oz (85 kg)   SpO2 99%   BMI 30.25 kg/m    Objective:   Physical Exam  Constitutional: He is oriented to person, place, and time. He appears well-developed and well-nourished. No distress.  HENT:  Head: Normocephalic and atraumatic.  Cardiovascular: Normal rate and regular rhythm.  No murmur heard. Pulmonary/Chest: Effort normal and breath sounds normal. No respiratory distress. He has no wheezes. He has no rales.  Musculoskeletal:  1+ bilateral LE edema  Neurological: He is alert and oriented to person, place, and time.  Skin: Skin is warm and dry.  Psychiatric: He has a normal mood and affect. His behavior is normal. Thought content normal.          Assessment & Plan:  HTN- bp stable on current meds. Continue same.   DM2- clinically stable. Off meds. Encouraged continued exercise/weight loss efforts.   Hyperlipidemia- LDL at goal, continue statin.  Gout- stable, continue allopurinol.

## 2017-09-07 NOTE — Patient Instructions (Signed)
Please complete lab work prior to leaving. Continue the good work with exercise/weight loss.

## 2017-09-09 ENCOUNTER — Encounter: Payer: Self-pay | Admitting: Family

## 2017-12-14 ENCOUNTER — Ambulatory Visit (INDEPENDENT_AMBULATORY_CARE_PROVIDER_SITE_OTHER): Payer: Medicare Other | Admitting: Family

## 2017-12-14 VITALS — BP 122/62 | HR 76 | Temp 97.7°F | Resp 16 | Ht 66.0 in | Wt 192.0 lb

## 2017-12-14 DIAGNOSIS — M109 Gout, unspecified: Secondary | ICD-10-CM | POA: Diagnosis not present

## 2017-12-14 DIAGNOSIS — E119 Type 2 diabetes mellitus without complications: Secondary | ICD-10-CM | POA: Diagnosis not present

## 2017-12-14 DIAGNOSIS — N289 Disorder of kidney and ureter, unspecified: Secondary | ICD-10-CM

## 2017-12-14 DIAGNOSIS — E785 Hyperlipidemia, unspecified: Secondary | ICD-10-CM | POA: Diagnosis not present

## 2017-12-14 DIAGNOSIS — I10 Essential (primary) hypertension: Secondary | ICD-10-CM | POA: Diagnosis not present

## 2017-12-14 LAB — BASIC METABOLIC PANEL
BUN: 46 mg/dL — AB (ref 6–23)
CO2: 26 meq/L (ref 19–32)
Calcium: 9.6 mg/dL (ref 8.4–10.5)
Chloride: 108 mEq/L (ref 96–112)
Creatinine, Ser: 1.92 mg/dL — ABNORMAL HIGH (ref 0.40–1.50)
GFR: 35.47 mL/min — ABNORMAL LOW (ref >60.00)
GLUCOSE: 97 mg/dL (ref 70–99)
POTASSIUM: 4.4 meq/L (ref 3.5–5.1)
SODIUM: 141 meq/L (ref 135–145)

## 2017-12-14 LAB — HEMOGLOBIN A1C: Hgb A1c MFr Bld: 6.2 % (ref 4.6–6.5)

## 2017-12-14 MED ORDER — COLCHICINE 0.6 MG PO TABS: ORAL_TABLET | ORAL | 5 refills | Status: DC

## 2017-12-14 NOTE — Patient Instructions (Signed)
Please complete lab work prior to leaving.   

## 2017-12-14 NOTE — Progress Notes (Signed)
Subjective:    Patient ID: Javier [name], male    DOB: 02-15-1930, 84 y.o.   MRN: [id number]  HPI  Javier [name] is an 84 yr old male who presents today for follow up.    DM2- maintained on diet alone.  Reports diet has been healthy.  Wt Readings from Last 3 Encounters:  12/14/17 192 lb (87.1 kg)  09/07/17 187 lb 6.4 oz (85 kg)  05/04/17 195 lb (88.5 kg)    Lab Results  Component Value Date   HGBA1C 5.9 09/07/2017   HGBA1C 6.1 05/04/2017   HGBA1C 5.8 12/29/2016   Lab Results  Component Value Date   MICROALBUR <0.7 12/25/2014   LDLCALC 92 05/04/2017   CREATININE 1.86 (H) 09/07/2017   HTN-bp meds include amlodipine, coreg, zestoretic.  Reports swelling is at baseline.   Hyperlipidemia-on mevacor/fish oil. Lab Results  Component Value Date   CHOL 152 05/04/2017   HDL 39.20 05/04/2017   LDLCALC 92 05/04/2017   LDLDIRECT 53.0 12/25/2014   TRIG 106.0 05/04/2017   CHOLHDL 4 05/04/2017   Gout- on allopurinol. No recent flare  Renal insufficiency-  Lab Results  Component Value Date   CREATININE 1.86 (H) 09/07/2017       Review of Systems    see HPI  Past Medical History:  Diagnosis Date  . Atrial fibrillation (HCC)   . Bronchitis   . CAD (coronary artery disease)   . Chronic renal insufficiency   . Congestive heart failure (HCC)   . Diabetes mellitus type II   . GERD (gastroesophageal reflux disease)   . Gout   . Hyperlipidemia   . Hypertension   . Leg cramps   . Osteoarthritis   . Shingles      Social History   Socioeconomic History  . Marital status: Legally Separated    Spouse name: Not on file  . Number of children: Not on file  . Years of education: Not on file  . Highest education level: Not on file  Occupational History  . Not on file  Social Needs  . Financial resource strain: Not on file  . Food insecurity:    Worry: Not on file    Inability: Not on file  . Transportation needs:    Medical: Not on file    Non-medical: Not on  file  Tobacco Use  . Smoking status: Former Games developermoker  . Smokeless tobacco: Never Used  . Tobacco comment: quit 40 years ago-40 pack year history  Substance and Sexual Activity  . Alcohol use: Yes    Comment: drinks liquor on occassion.  . Drug use: No  . Sexual activity: Not on file  Lifestyle  . Physical activity:    Days per week: Not on file    Minutes per session: Not on file  . Stress: Not on file  Relationships  . Social connections:    Talks on phone: Not on file    Gets together: Not on file    Attends religious service: Not on file    Active member of club or organization: Not on file    Attends meetings of clubs or organizations: Not on file    Relationship status: Not on file  . Intimate partner violence:    Fear of current or ex partner: Not on file    Emotionally abused: Not on file    Physically abused: Not on file    Forced sexual activity: Not on file  Other Topics Concern  . 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 CARDIOVERSION  11/02/2005   s/p  . CORONARY ARTERY BYPASS GRAFT  05/18/2005  . EYE SURGERY     CATARACT SX 06/2015 both eyes Dr.Beavis per pt  . EYE SURGERY Bilateral 12/09/2016   revision of cataract surgery from 2017.  Marland Kitchen MITRAL VALVE REPAIR  04/2005   s/p mitral valve repair    Family History  Problem Relation Age of Onset  . Diabetes Unknown        siblings  . Melanoma Brother        died at 46    No Known Allergies  Current Outpatient Medications on File Prior to Visit  Medication Sig Dispense Refill  . allopurinol (ZYLOPRIM) 100 MG tablet TAKE 1 TABLET BY MOUTH DAILY 90 tablet 1  . amLODipine (NORVASC) 10 MG tablet Take 1 tablet (10 mg total) by mouth daily. 90 tablet 1  . aspirin  81 MG tablet Take 81 mg by mouth daily.      . carvedilol (COREG) 6.25 MG tablet TAKE 1 TABLET BY MOUTH 2 TIMES DAILY WITH MEALS 180 tablet 1  . colchicine 0.6 MG tablet 2 tabs by mouth at start of gout flare.  Then one tablet 1 hour later. 6 tablet 5  . folic acid (FOLVITE) 1 MG tablet Take 1 tablet (1 mg total) by mouth daily. 30 tablet 5  . furosemide (LASIX) 20 MG tablet TAKE 1 TABLET BY MOUTH DAILY 90 tablet 1  . lisinopril-hydrochlorothiazide (PRINZIDE,ZESTORETIC) 20-25 MG tablet TAKE 1 TABLET BY MOUTH DAILY 90 tablet 1  . lovastatin (MEVACOR) 40 MG tablet TAKE 1 TABLET BY MOUTH DAILY 90 tablet 1  . Omega-3 Fatty Acids (FISH OIL) 1000 MG CAPS Take 2 capsules (2,000 mg total) by mouth 2 (two) times daily.  0   No current facility-administered medications on file prior to visit.     BP 122/62 (BP Location: Left Arm, Patient Position: Sitting, Cuff Size: Small)   Pulse 76   Temp 97.7 F (36.5 C) (Oral)   Resp 16   Ht 5\' 6"  (1.676 m)   Wt 192 lb (87.1 kg)   SpO2 98%   BMI 30.99 kg/m    Objective:   Physical Exam  Constitutional: He is oriented to person, place, and time. He appears well-developed and well-nourished. No distress.  HENT:  Head: Normocephalic and atraumatic.  Cardiovascular: Normal rate and regular rhythm.  No murmur heard. Pulmonary/Chest: Effort normal and breath sounds normal. No respiratory distress. He has no wheezes. He has no rales.  Musculoskeletal:  2+ bilateral LE edema  Neurological: He is alert and oriented to person, place, and time.  Skin: Skin is warm and dry.  Psychiatric: He has a normal mood and affect. His behavior is normal. Thought content normal.          Assessment & Plan:  HTN- bp stable, continue current meds. Obtain follow up bmet.  Renal insufficiency- refer back to nephrology- reports last visit was 1 year ago.  DM2- clinically stable. Obtain follow up A1C.  Hyperlipidemia- LDL at goal, tolerating statin, continue same.    Gout- stable on allopurinol. Continue same.

## 2017-12-15 ENCOUNTER — Encounter: Payer: Self-pay | Admitting: Family

## 2018-02-19 ENCOUNTER — Other Ambulatory Visit: Payer: Self-pay | Admitting: Family

## 2018-04-05 ENCOUNTER — Ambulatory Visit (INDEPENDENT_AMBULATORY_CARE_PROVIDER_SITE_OTHER): Payer: Medicare Other | Admitting: Family Medicine

## 2018-04-05 ENCOUNTER — Ambulatory Visit (HOSPITAL_BASED_OUTPATIENT_CLINIC_OR_DEPARTMENT_OTHER)
Admission: RE | Admit: 2018-04-05 | Discharge: 2018-04-05 | Disposition: A | Discharge status: Home / Self Care | Payer: Medicare Other | Source: Ambulatory Visit | Attending: Family Medicine | Admitting: Family Medicine

## 2018-04-05 ENCOUNTER — Encounter: Payer: Self-pay | Admitting: Family Medicine

## 2018-04-05 VITALS — BP 108/62 | HR 73 | Temp 98.1°F | Ht 66.0 in | Wt 185.4 lb

## 2018-04-05 DIAGNOSIS — M79601 Pain in right arm: Secondary | ICD-10-CM

## 2018-04-05 DIAGNOSIS — M79621 Pain in right upper arm: Secondary | ICD-10-CM | POA: Diagnosis not present

## 2018-04-05 DIAGNOSIS — S79921A Unspecified injury of right thigh, initial encounter: Secondary | ICD-10-CM | POA: Diagnosis not present

## 2018-04-05 DIAGNOSIS — Z23 Encounter for immunization: Secondary | ICD-10-CM | POA: Diagnosis not present

## 2018-04-05 MED ORDER — TRAMADOL HCL 50 MG PO TABS: 25.0000 mg | ORAL_TABLET | Freq: Two times a day (BID) | ORAL | 0 refills | Status: DC | PRN

## 2018-04-05 NOTE — Progress Notes (Signed)
Musculoskeletal Exam  Patient: Georgiann MohsJames F Freel DOB: 14-Jun-1929  DOS: 04/05/2018  SUBJECTIVE:  Chief Complaint:   Chief Complaint  Patient presents with  . Fall    right shoulder pain    Georgiann MohsJames F Bun is a 84 y.o.  male for evaluation and treatment of R shoulder pain.   Onset:  5 days ago.  Fell onto it. Location: r shoulder Character:  aching, sharp, shooting and stabbing  Associated symptoms: decreased ROM, weak from pain, getting worse Treatment: to date has been acetaminophen.   Neurovascular symptoms: no  ROS: Musculoskeletal/Extremities: +R shoulder pain  Past Medical History:  Diagnosis Date  . Atrial fibrillation (HCC)   . Bronchitis   . CAD (coronary artery disease)   . Chronic renal insufficiency   . Congestive heart failure (HCC)   . Diabetes mellitus type II   . GERD (gastroesophageal reflux disease)   . Gout   . Hyperlipidemia   . Hypertension   . Leg cramps   . Osteoarthritis   . Shingles     Objective: VITAL SIGNS: BP 108/62 (BP Location: Left Arm, Patient Position: Sitting, Cuff Size: Normal)   Pulse 73   Temp 98.1 F (36.7 C) (Oral)   Ht 5\' 6"  (1.676 m)   Wt 185 lb 6 oz (84.1 kg)   SpO2 95%   BMI 29.92 kg/m  Constitutional: Well formed, well developed. No acute distress. Thorax & Lungs: No accessory muscle use Musculoskeletal: R shoulder.   Normal active range of motion: no.   Normal passive range of motion: no, 2/2 pain Tenderness to palpation: yes over out mid humerous Deformity: no Ecchymosis: no No bony ttp over acromion or coracoid Neurologic: Normal sensory function. No focal deficits noted.  Psychiatric: Normal mood. Age appropriate judgment and insight. Alert & oriented x 3.    Assessment:  Pain of right upper extremity - Plan: DG Humerus Right, traMADol (ULTRAM) 50 MG tablet  Need for influenza vaccination - Plan: Flu vaccine HIGH DOSE PF (Fluzone High dose)  Plan: XR humerus shows no acute changes, await official read.   Ice. Activity as tolerated. Try to keep moving. Sling for comfort. Low dose of tramadol at night.  F/u prn. The patient voiced understanding and agreement to the plan.   Jilda Rocheicholas Paul Em[place]ium[place], DO 04/05/18  2:12 PM

## 2018-04-05 NOTE — Progress Notes (Signed)
Pre visit review using our clinic review tool, if applicable. No additional management support is needed unless otherwise documented below in the visit note. 

## 2018-04-05 NOTE — Patient Instructions (Addendum)
Ice/cold pack over area for 10-15 min twice daily.  OK to take Tylenol 1000 mg (2 extra strength tabs) or 975 mg (3 regular strength tabs) every 6 hours as needed.  Wear the sling as needed for comfort.  Activity as tolerated, but try to keep moving.    Do not drink alcohol, do any illicit/street drugs, drive or do anything that requires alertness while on this medicine.   If you aren't any better after around 2-3 weeks, please let me know. Let me know sooner if worsening.   Let us know if you need anything.

## 2018-04-18 ENCOUNTER — Encounter: Payer: Self-pay | Admitting: Family

## 2018-04-18 ENCOUNTER — Ambulatory Visit (INDEPENDENT_AMBULATORY_CARE_PROVIDER_SITE_OTHER): Payer: Medicare Other | Admitting: Family

## 2018-04-18 VITALS — BP 103/70 | HR 61 | Temp 98.5°F | Resp 16 | Ht 66.0 in | Wt 182.0 lb

## 2018-04-18 DIAGNOSIS — N183 Chronic kidney disease, stage 3 unspecified: Secondary | ICD-10-CM

## 2018-04-18 DIAGNOSIS — G4700 Insomnia, unspecified: Secondary | ICD-10-CM

## 2018-04-18 DIAGNOSIS — M25511 Pain in right shoulder: Secondary | ICD-10-CM

## 2018-04-18 DIAGNOSIS — I10 Essential (primary) hypertension: Secondary | ICD-10-CM | POA: Diagnosis not present

## 2018-04-18 DIAGNOSIS — E118 Type 2 diabetes mellitus with unspecified complications: Secondary | ICD-10-CM

## 2018-04-18 DIAGNOSIS — E782 Mixed hyperlipidemia: Secondary | ICD-10-CM

## 2018-04-18 LAB — LIPID PANEL
Cholesterol: 96 mg/dL (ref 0–200)
HDL: 39.9 mg/dL (ref >39.00)
LDL Cholesterol: 41 mg/dL (ref 0–99)
NONHDL: 55.75
Total CHOL/HDL Ratio: 2
Triglycerides: 74 mg/dL (ref 0.0–149.0)
VLDL: 14.8 mg/dL (ref 0.0–40.0)

## 2018-04-18 LAB — BASIC METABOLIC PANEL
BUN: 51 mg/dL — ABNORMAL HIGH (ref 6–23)
CO2: 28 mEq/L (ref 19–32)
Calcium: 9.4 mg/dL (ref 8.4–10.5)
Chloride: 106 mEq/L (ref 96–112)
Creatinine, Ser: 1.89 mg/dL — ABNORMAL HIGH (ref 0.40–1.50)
GFR: 36.09 mL/min — ABNORMAL LOW (ref >60.00)
Glucose, Bld: 97 mg/dL (ref 70–99)
Potassium: 4.5 mEq/L (ref 3.5–5.1)
Sodium: 142 mEq/L (ref 135–145)

## 2018-04-18 LAB — HEMOGLOBIN A1C: Hgb A1c MFr Bld: 6 % (ref 4.6–6.5)

## 2018-04-18 NOTE — Progress Notes (Signed)
Subjective:    Patient ID: Javier [name], male    DOB: [birth year], 84 y.o.   MRN: [id number]  HPI  Patient is an 84 yr old male who presents today for follow up.  DM2-  Maintained on diet alone.  Lab Results  Component Value Date   HGBA1C 6.2 12/14/2017   HGBA1C 5.9 09/07/2017   HGBA1C 6.1 05/04/2017   Lab Results  Component Value Date   MICROALBUR <0.7 12/25/2014   LDLCALC 92 05/04/2017   CREATININE 1.92 (H) 12/14/2017   Renal insufficiency- referral was made to [facility] last visit.  Lab Results  Component Value Date   CREATININE 1.92 (H) 12/14/2017   R shoulder pain- reports pain began after an accidental fall on 03/06/18.  He saw Dr. Carmelia Roller on 12/17 for this complaint. Xray of humerus was negative for acute fracture. Incidental note was made of right 6th and 7th anterolateral fractures.  He reports that he has difficulty going to sleep.  Reports that he has been using xanax off of the street.  Does not nap much during the day. Gets up a few times at night to urinate.   Wakes up at 5 AM. Denies any coffee.  Rare iced tea.  He drinks 2 diet pepsi's a day.    Notes that he has been working on watching his diet and losing weight.  Wt Readings from Last 3 Encounters:  04/18/18 182 lb (82.6 kg)  04/05/18 185 lb 6 oz (84.1 kg)  12/14/17 192 lb (87.1 kg)      Review of Systems   See HPI  Past Medical History:  Diagnosis Date  . Atrial fibrillation (HCC)   . Bronchitis   . CAD (coronary artery disease)   . Chronic renal insufficiency   . Congestive heart failure (HCC)   . Diabetes mellitus type II   . GERD (gastroesophageal reflux disease)   . Gout   . Hyperlipidemia   . Hypertension   . Leg cramps   . Osteoarthritis   . Shingles      Social History   Socioeconomic History  . Marital status: Legally Separated    Spouse name: Not on file  . Number of children: Not on file  . Years of education: Not on file  . Highest education  level: Not on file  Occupational History  . Not on file  Social Needs  . Financial resource strain: Not on file  . Food insecurity:    Worry: Not on file    Inability: Not on file  . Transportation needs:    Medical: Not on file    Non-medical: Not on file  Tobacco Use  . Smoking status: Former Games developer  . Smokeless tobacco: Never Used  . Tobacco comment: quit 40 years ago-40 pack year history  Substance and Sexual Activity  . Alcohol use: Yes    Comment: drinks liquor on occassion.  . Drug use: No  . Sexual activity: Not on file  Lifestyle  . Physical activity:    Days per week: Not on file    Minutes per session: Not on file  . Stress: Not on file  Relationships  . Social connections:    Talks on phone: Not on file    Gets together: Not on file    Attends religious service: Not on file    Active member of club or organization: Not on file    Attends meetings of clubs or organizations: Not on file  Relationship status: Not on file  . Intimate partner violence:    Fear of current or ex partner: Not on file    Emotionally abused: Not on file    Physically abused: Not on file    Forced sexual activity: Not on file  Other Topics Concern  . Not on file  Social History Narrative   Last updated: 02/13/2010   Married but separated from wife   Alcohol use-yes   Former Smoker quit 40 yrs ago (40 pack yr history)     Works part time at [employer] [place] [employer]    Past Surgical History:  Procedure Laterality Date  . CARDIOVERSION  11/02/2005   s/p  . CORONARY ARTERY BYPASS GRAFT  05/18/2005  . EYE SURGERY     CATARACT SX 06/2015 both eyes Dr.Beavis per pt  . EYE SURGERY Bilateral 12/09/2016   revision of cataract surgery from 2017.  Marland Kitchen. MITRAL VALVE REPAIR  04/2005   s/p mitral valve repair    Family History  Problem Relation Age of Onset  . Diabetes Unknown        siblings  . Melanoma Brother        died at 6491    No Known Allergies  Current  Outpatient Medications on File Prior to Visit  Medication Sig Dispense Refill  . allopurinol (ZYLOPRIM) 100 MG tablet TAKE 1 TABLET BY MOUTH ONCE DAILY 90 tablet 1  . amLODipine (NORVASC) 10 MG tablet Take 1 tablet (10 mg total) by mouth daily. 90 tablet 1  . aspirin 81 MG tablet Take 81 mg by mouth daily.      . carvedilol (COREG) 6.25 MG tablet TAKE 1 TABLET BY MOUTH 2 TIMES DAILY WITH MEALS 180 tablet 1  . colchicine 0.6 MG tablet 2 tabs by mouth at start of gout flare.  Then one tablet 1 hour later. 6 tablet 5  . folic acid (FOLVITE) 1 MG tablet Take 1 tablet (1 mg total) by mouth daily. 30 tablet 5  . furosemide (LASIX) 20 MG tablet TAKE 1 TABLET BY MOUTH DAILY 90 tablet 1  . lisinopril-hydrochlorothiazide (PRINZIDE,ZESTORETIC) 20-25 MG tablet TAKE 1 TABLET BY MOUTH DAILY 90 tablet 1  . lovastatin (MEVACOR) 40 MG tablet TAKE 1 TABLET BY MOUTH DAILY 90 tablet 1  . Omega-3 Fatty Acids (FISH OIL) 1000 MG CAPS Take 2 capsules (2,000 mg total) by mouth 2 (two) times daily.  0  . traMADol (ULTRAM) 50 MG tablet Take 0.5-1 tablets (25-50 mg total) by mouth every 12 (twelve) hours as needed for moderate pain. 10 tablet 0   No current facility-administered medications on file prior to visit.     BP 103/70 (BP Location: Right Arm, Patient Position: Sitting, Cuff Size: Small)   Pulse 61   Temp 98.5 F (36.9 C) (Oral)   Resp 16   Ht 5\' 6"  (1.676 m)   Wt 182 lb (82.6 kg)   SpO2 98%   BMI 29.38 kg/m       Objective:   Physical Exam Constitutional:      General: He is not in acute distress.    Appearance: He is well-developed.  HENT:     Head: Normocephalic and atraumatic.  Cardiovascular:     Rate and Rhythm: Normal rate and regular rhythm.     Heart sounds: No murmur.     Comments: 2+ bilateral LE edema.  Pulmonary:     Effort: Pulmonary effort is normal. No respiratory distress.  Breath sounds: Normal breath sounds. No wheezing or rales.  Musculoskeletal:     Comments: R  shoulder is tender to palpation. No swelling. + pain with active and passive ROM, decreased right shoulder abduction.   Skin:    General: Skin is warm and dry.  Neurological:     Mental Status: He is alert and oriented to person, place, and time.  Psychiatric:        Behavior: Behavior normal.        Thought Content: Thought content normal.           Assessment & Plan:  Insomnia- advised pt that xanax is not considered a safe medication in patients his age. Advised trial of melatonin 1mg  instead.  DM2- not checking sugars but he has been working on diet/weight loss so I suspect his sugars will look better. Check A1C.  R shoulder pain- suspect rotator cuff involvement.  Will refer to sports medicine.  Renal insufficiency- declines follow up with nephrology at this time. Will continue to monitor Cr.  HTN- BP stable on current meds. Continue same.  BP Readings from Last 3 Encounters:  04/18/18 103/70  04/05/18 108/62  12/14/17 122/62   Hyperlipidemia-  Lab Results  Component Value Date   CHOL 152 05/04/2017   HDL 39.20 05/04/2017   LDLCALC 92 05/04/2017   LDLDIRECT 53.0 12/25/2014   TRIG 106.0 05/04/2017   CHOLHDL 4 05/04/2017   Due for repeat lipid panel. Continue statin.

## 2018-04-18 NOTE — Patient Instructions (Addendum)
You may try using melatonin 1mg  one hour before bedtime. Complete lab work prior to leaving.

## 2018-04-21 ENCOUNTER — Encounter: Payer: Self-pay | Admitting: Family

## 2018-04-25 ENCOUNTER — Ambulatory Visit: Payer: Medicare Other

## 2018-04-25 ENCOUNTER — Ambulatory Visit (INDEPENDENT_AMBULATORY_CARE_PROVIDER_SITE_OTHER): Payer: Medicare Other | Admitting: Family Medicine

## 2018-04-25 ENCOUNTER — Encounter: Payer: Self-pay | Admitting: Family Medicine

## 2018-04-25 VITALS — BP 113/63 | HR 74 | Ht 67.0 in | Wt 175.0 lb

## 2018-04-25 DIAGNOSIS — M25511 Pain in right shoulder: Secondary | ICD-10-CM

## 2018-04-25 MED ORDER — HYDROCODONE-ACETAMINOPHEN 5-325 MG PO TABS: 1.0000 | ORAL_TABLET | Freq: Four times a day (QID) | ORAL | 0 refills | Status: DC | PRN

## 2018-04-25 MED ORDER — METHYLPREDNISOLONE ACETATE 40 MG/ML IJ SUSP
40.0000 mg | Freq: Once | INTRAMUSCULAR | Status: AC
  Administered 2018-04-25: 40 mg

## 2018-04-25 NOTE — Progress Notes (Signed)
PCP: Sandford Craze, NP  Subjective:   HPI: Patient is a 85 y.o. male here for right shoulder pain.  Patient reports 7/10 right shoulder pain.  Patient suffered what sounds to be a mechanical fall about 4 weeks ago.  He was seen by his PCP who obtained x-rays of his right humerus.  He is tried Ultram 50 mg which he does not feel helps his pain.  He does note that he gets some pain relief while in a hot shower.  He does report some pain at rest but this is made worse with trying to raise his hand overhead.  He has difficulty getting his arm above 90 degrees.  He denies any swelling or erythema.  No distal numbness or tingling.  No associated skin changes  Past Medical History:  Diagnosis Date  . Atrial fibrillation (HCC)   . Bronchitis   . CAD (coronary artery disease)   . Chronic renal insufficiency   . Congestive heart failure (HCC)   . Diabetes mellitus type II   . GERD (gastroesophageal reflux disease)   . Gout   . Hyperlipidemia   . Hypertension   . Leg cramps   . Osteoarthritis   . Shingles     Current Outpatient Medications on File Prior to Visit  Medication Sig Dispense Refill  . allopurinol (ZYLOPRIM) 100 MG tablet TAKE 1 TABLET BY MOUTH ONCE DAILY 90 tablet 1  . amLODipine (NORVASC) 10 MG tablet Take 1 tablet (10 mg total) by mouth daily. 90 tablet 1  . aspirin 81 MG tablet Take 81 mg by mouth daily.      . carvedilol (COREG) 6.25 MG tablet TAKE 1 TABLET BY MOUTH 2 TIMES DAILY WITH MEALS 180 tablet 1  . colchicine 0.6 MG tablet 2 tabs by mouth at start of gout flare.  Then one tablet 1 hour later. 6 tablet 5  . folic acid (FOLVITE) 1 MG tablet Take 1 tablet (1 mg total) by mouth daily. 30 tablet 5  . furosemide (LASIX) 20 MG tablet TAKE 1 TABLET BY MOUTH DAILY 90 tablet 1  . lisinopril-hydrochlorothiazide (PRINZIDE,ZESTORETIC) 20-25 MG tablet TAKE 1 TABLET BY MOUTH DAILY 90 tablet 1  . lovastatin (MEVACOR) 40 MG tablet TAKE 1 TABLET BY MOUTH DAILY 90 tablet 1  .  Omega-3 Fatty Acids (FISH OIL) 1000 MG CAPS Take 2 capsules (2,000 mg total) by mouth 2 (two) times daily.  0  . traMADol (ULTRAM) 50 MG tablet Take 0.5-1 tablets (25-50 mg total) by mouth every 12 (twelve) hours as needed for moderate pain. 10 tablet 0   No current facility-administered medications on file prior to visit.     Past Surgical History:  Procedure Laterality Date  . CARDIOVERSION  11/02/2005   s/p  . CORONARY ARTERY BYPASS GRAFT  05/18/2005  . EYE SURGERY     CATARACT SX 06/2015 both eyes Dr.Beavis per pt  . EYE SURGERY Bilateral 12/09/2016   revision of cataract surgery from 2017.  Marland Kitchen MITRAL VALVE REPAIR  04/2005   s/p mitral valve repair    No Known Allergies  Social History   Socioeconomic History  . Marital status: Legally Separated    Spouse name: Not on file  . Number of children: Not on file  . Years of education: Not on file  . Highest education level: Not on file  Occupational History  . Not on file  Social Needs  . Financial resource strain: Not on file  . Food insecurity:  Worry: Not on file    Inability: Not on file  . Transportation needs:    Medical: Not on file    Non-medical: Not on file  Tobacco Use  . Smoking status: Former Games developermoker  . Smokeless tobacco: Never Used  . Tobacco comment: quit 40 years ago-40 pack year history  Substance and Sexual Activity  . Alcohol use: Yes    Comment: drinks liquor on occassion.  . Drug use: No  . Sexual activity: Not on file  Lifestyle  . Physical activity:    Days per week: Not on file    Minutes per session: Not on file  . Stress: Not on file  Relationships  . Social connections:    Talks on phone: Not on file    Gets together: Not on file    Attends religious service: Not on file    Active member of club or organization: Not on file    Attends meetings of clubs or organizations: Not on file    Relationship status: Not on file  . Intimate partner violence:    Fear of current or ex partner: Not  on file    Emotionally abused: Not on file    Physically abused: Not on file    Forced sexual activity: Not on file  Other Topics Concern  . Not on file  Social History Narrative   Last updated: 02/13/2010   Married but separated from wife   Alcohol use-yes   Former Smoker quit 40 yrs ago (40 pack yr history)     Works part time at [employer], [employer] [place] [employer]    Family History  Problem Relation Age of Onset  . Diabetes Unknown        siblings  . Melanoma Brother        died at 91    BP 113/63   Pulse 74   Ht 5\' 7"  (1.702 m)   Wt 175 lb (79.4 kg)   BMI 27.41 kg/m   Review of Systems: See HPI above.     Objective:  Physical Exam:  Gen: awake, alert, NAD, comfortable in exam room Pulm: breathing unlabored  Right shoulder: No obvious deformity or asymmetry. No bruising. No swelling Tenderness over the lateral shoulder Limited active range of motion in flexion and abduction.  Passive motion is limited due to pain.  Symmetric range of motion bilaterally in internal rotation and external rotation NV intact distally Special Tests:  - Impingement: Positive Hawkins and Neers.  - Supraspinatus: Positive empty can.  5/5 strength - Infraspinatus/Teres: 5/5 strength with ER - Subscapularis: negative belly press, negative bear hug. 5/5 strength with IR  Left shoulder: No obvious deformity Full range of motion N/V intact distally  Ultrasound of Shoulder  BT short: BT appears thickened, intact, no tenosynovitis BT long: thickened, intact Supraspinatus tendon: full thickness tear with retraction Subscapularis tendon: normal appearance without tears or degenerative changes Infraspinatus tendon:  appears atrophied, possible presence of old tear and subsequent remodeling GH joint: slight calcification seen within the posterior labrum AC joint: moderate degenerative changes, positive geyser sign  Summary and Additional findings- full thickness tear with  retraction  Assessment & Plan:  1. Right shoulder pain secondary to traumatic rotator cuff tear.  Previous x-rays reviewed which show no acute abnormalities of the shoulder or humerus.  Ultrasound performed today shows a full-thickness tear of the supraspinatus with retraction.  This is consistent with his exam.  Surgical and nonsurgical options were discussed with  the patient.  Patient is adamant that given his age, he is not interested in pursuing surgery - advised he will have lack of motion, weakness long term without surgical intervention. He verbalized understanding; he would like to proceed with steroid injection to help with the pain. - Subacromial injection performed today - Norco for severe pain - Patient given home exercises for range of motion - f/u 4 weeks  Procedure performed: subacromial corticosteroid injection; ultrasound guided  Consent obtained and verified. Time-out conducted. Noted no overlying erythema, induration, or other signs of local infection. The right lateral subacromial space and defect in the supraspinatus was identified with [place] and marked. The overlying skin was prepped in a sterile fashion. Needle was advanced under US guidance. Topical analgesic spray: Ethyl chloride. Joint: RIGHT subacromial Needle: 25ga, 1.5" Completed without difficulty. Meds: depomedrol 40mg , lidocaine 3cc

## 2018-04-25 NOTE — Patient Instructions (Signed)
You have torn your rotator cuff. Do arm circles, swings, table slides to try to keep the motion. Long term your motion and strength will be affected without going ahead with surgery. You were given a steroid injection today to help with the pain. Take norco as needed for severe pain. Follow up with me in 4 weeks for reevaluation. We will consider physical therapy to help regain as much motion and strength as you can.

## 2018-05-23 ENCOUNTER — Ambulatory Visit: Payer: Medicare Other | Admitting: Family Medicine

## 2018-05-23 ENCOUNTER — Other Ambulatory Visit: Payer: Self-pay | Admitting: Family

## 2018-07-18 ENCOUNTER — Other Ambulatory Visit: Payer: Self-pay

## 2018-07-18 ENCOUNTER — Ambulatory Visit (INDEPENDENT_AMBULATORY_CARE_PROVIDER_SITE_OTHER): Payer: Medicare Other | Admitting: Family

## 2018-07-18 DIAGNOSIS — E1122 Type 2 diabetes mellitus with diabetic chronic kidney disease: Secondary | ICD-10-CM | POA: Diagnosis not present

## 2018-07-18 DIAGNOSIS — E785 Hyperlipidemia, unspecified: Secondary | ICD-10-CM

## 2018-07-18 DIAGNOSIS — G4700 Insomnia, unspecified: Secondary | ICD-10-CM | POA: Diagnosis not present

## 2018-07-18 DIAGNOSIS — I10 Essential (primary) hypertension: Secondary | ICD-10-CM

## 2018-07-18 MED ORDER — AMLODIPINE BESYLATE 10 MG PO TABS: 10.0000 mg | ORAL_TABLET | Freq: Every day | ORAL | 1 refills | Status: DC

## 2018-07-18 MED ORDER — LOVASTATIN 40 MG PO TABS: 40.0000 mg | ORAL_TABLET | Freq: Every day | ORAL | 1 refills | Status: DC

## 2018-07-18 MED ORDER — ALLOPURINOL 100 MG PO TABS: 100.0000 mg | ORAL_TABLET | Freq: Every day | ORAL | 1 refills | Status: DC

## 2018-07-18 MED ORDER — FUROSEMIDE 20 MG PO TABS: 20.0000 mg | ORAL_TABLET | Freq: Every day | ORAL | 1 refills | Status: DC

## 2018-07-18 MED ORDER — COLCHICINE 0.6 MG PO TABS: ORAL_TABLET | ORAL | 5 refills | Status: DC

## 2018-07-18 MED ORDER — LISINOPRIL-HYDROCHLOROTHIAZIDE 20-25 MG PO TABS: 1.0000 | ORAL_TABLET | Freq: Every day | ORAL | 1 refills | Status: DC

## 2018-07-18 NOTE — Progress Notes (Signed)
Virtual Visit via Telephone Note  I connected with Javier [name] on 07/18/18 at 10:00 AM EDT by telephone and verified that I am speaking with the correct person using two identifiers.   I discussed the limitations, risks, security and privacy concerns of performing an evaluation and management service by telephone and the availability of in person appointments. I also discussed with the patient that there may be a patient responsible charge related to this service. The patient expressed understanding and agreed to proceed.  The patient was at home and I was in my office.  History of Present Illness:  Patient is an 85 yr old male who presents today for follow up.  DM2- reports that he continues a healthy diet.   Wt Readings from Last 3 Encounters:  04/25/18 175 lb (79.4 kg)  04/18/18 182 lb (82.6 kg)  04/05/18 185 lb 6 oz (84.1 kg)    Lab Results  Component Value Date   HGBA1C 6.0 04/18/2018   HGBA1C 6.2 12/14/2017   HGBA1C 5.9 09/07/2017   Lab Results  Component Value Date   MICROALBUR <0.7 12/25/2014   LDLCALC 41 04/18/2018   CREATININE 1.89 (H) 04/18/2018   Gout- continues the allopurinol.  HTN- continues lisinopril-hctz. Reports that he denies any swelling in his ankles.  BP Readings from Last 3 Encounters:  04/25/18 113/63  04/18/18 103/70  04/05/18 108/62   Hyperlipidemia- continues mevacor.  Lab Results  Component Value Date   CHOL 96 04/18/2018   HDL 39.90 04/18/2018   LDLCALC 41 04/18/2018   LDLDIRECT 53.0 12/25/2014   TRIG 74.0 04/18/2018   CHOLHDL 2 04/18/2018   Insomnia- reports that the only thing that helps him is xanax to sleep. Reports that he "buys them on the street but it would be a lot cheaper if you would give them to me."     Observations/Objective:  Gen: Awake, alert, NAD Resp: breathing non-labored Psych: calm/pleasant affect Neuro: A and O x 3.   Assessment and Plan:  DM2- clinically stable. Will hold off on lab work at this time  due to Covid-19 and increased risk of complication if pt gets exposed.  Reinforced importance of diet and exercise.  Insomnia- advised pt against the use of xanax given his age and increased risk of falls due to his age.    HTN- reports good med compliance. Continue current meds.   Hyperlipidemia- Lipids stable.  Continue statin.    Follow Up Instructions:    I discussed the assessment and treatment plan with the patient. The patient was provided an opportunity to ask questions and all were answered. The patient agreed with the plan and demonstrated an understanding of the instructions.   The patient was advised to call back or seek an in-person evaluation if the symptoms worsen or if the condition fails to improve as anticipated.  I provided 11 minutes of non-face-to-face time during this encounter.   Lemont Fillers, NP

## 2018-08-19 ENCOUNTER — Other Ambulatory Visit: Payer: Self-pay | Admitting: Family

## 2018-11-07 ENCOUNTER — Telehealth: Payer: Self-pay | Admitting: *Deleted

## 2018-11-07 NOTE — Telephone Encounter (Signed)
Patient was scheduled for a 3 months follow up

## 2018-11-07 NOTE — Telephone Encounter (Signed)
Noted  

## 2018-11-07 NOTE — Telephone Encounter (Signed)
Melissa -- please see below and advise.  Copied from [place] [phone]. Topic: Appointment Scheduling - Scheduling Inquiry for Clinic >> Nov 03, 2018  5:19 PM Alanda Slim E wrote: Reason for CRM: Pt needs an appt for weight loss, arm pain from a fall and memory loss. Pt's Grand Daughter(Meredith) stated that he will not come in unless he thinks its for a check up/ she is asking for the office to call him and pretend it is for a check up and then when he arrives he can discuss things that have been going on. He will not go to the Dr. Unless he is called in and his family is worried about his stubbornness to go to the doctor / please advise >> Nov 04, 2018  8:22 AM Margot Ables wrote: Rod Holler - Does Melissa feel comfortable with pt being contacted stating we want him to come in for a check up given the info below?

## 2018-11-15 ENCOUNTER — Ambulatory Visit: Payer: Medicare Other | Admitting: Family

## 2018-11-15 ENCOUNTER — Other Ambulatory Visit: Payer: Self-pay
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
## 2018-11-16 ENCOUNTER — Ambulatory Visit (HOSPITAL_BASED_OUTPATIENT_CLINIC_OR_DEPARTMENT_OTHER)
Admission: RE | Admit: 2018-11-16 | Discharge: 2018-11-16 | Disposition: A | Discharge status: Home / Self Care | Payer: Medicare Other | Source: Ambulatory Visit | Attending: Family | Admitting: Family

## 2018-11-16 ENCOUNTER — Telehealth: Payer: Self-pay | Admitting: Family

## 2018-11-16 ENCOUNTER — Ambulatory Visit (INDEPENDENT_AMBULATORY_CARE_PROVIDER_SITE_OTHER): Payer: Medicare Other | Admitting: Family

## 2018-11-16 ENCOUNTER — Other Ambulatory Visit: Payer: Self-pay

## 2018-11-16 ENCOUNTER — Encounter: Payer: Self-pay | Admitting: Family

## 2018-11-16 VITALS — BP 96/50 | HR 68 | Temp 98.6°F | Resp 16 | Ht 68.0 in | Wt 152.2 lb

## 2018-11-16 DIAGNOSIS — I509 Heart failure, unspecified: Secondary | ICD-10-CM | POA: Diagnosis not present

## 2018-11-16 DIAGNOSIS — N289 Disorder of kidney and ureter, unspecified: Secondary | ICD-10-CM | POA: Diagnosis not present

## 2018-11-16 DIAGNOSIS — R5383 Other fatigue: Secondary | ICD-10-CM

## 2018-11-16 DIAGNOSIS — R05 Cough: Secondary | ICD-10-CM | POA: Diagnosis not present

## 2018-11-16 DIAGNOSIS — R21 Rash and other nonspecific skin eruption: Secondary | ICD-10-CM

## 2018-11-16 DIAGNOSIS — D649 Anemia, unspecified: Secondary | ICD-10-CM | POA: Diagnosis not present

## 2018-11-16 DIAGNOSIS — E860 Dehydration: Secondary | ICD-10-CM | POA: Diagnosis not present

## 2018-11-16 DIAGNOSIS — R531 Weakness: Secondary | ICD-10-CM

## 2018-11-16 DIAGNOSIS — I4891 Unspecified atrial fibrillation: Secondary | ICD-10-CM | POA: Diagnosis not present

## 2018-11-16 DIAGNOSIS — I480 Paroxysmal atrial fibrillation: Secondary | ICD-10-CM | POA: Diagnosis not present

## 2018-11-16 DIAGNOSIS — R5381 Other malaise: Secondary | ICD-10-CM | POA: Diagnosis not present

## 2018-11-16 DIAGNOSIS — R059 Cough, unspecified: Secondary | ICD-10-CM

## 2018-11-16 DIAGNOSIS — I499 Cardiac arrhythmia, unspecified: Secondary | ICD-10-CM | POA: Diagnosis not present

## 2018-11-16 DIAGNOSIS — R634 Abnormal weight loss: Secondary | ICD-10-CM

## 2018-11-16 DIAGNOSIS — R918 Other nonspecific abnormal finding of lung field: Secondary | ICD-10-CM | POA: Diagnosis not present

## 2018-11-16 LAB — CBC WITH DIFFERENTIAL/PLATELET
Basophils Absolute: 0 10*3/uL (ref 0.0–0.1)
Basophils Relative: 0.4 % (ref 0.0–3.0)
Eosinophils Absolute: 0.1 10*3/uL (ref 0.0–0.7)
Eosinophils Relative: 1.8 % (ref 0.0–5.0)
HCT: 34.9 % — ABNORMAL LOW (ref 39.0–52.0)
Hemoglobin: 11.8 g/dL — ABNORMAL LOW (ref 13.0–17.0)
Lymphocytes Relative: 12.5 % (ref 12.0–46.0)
Lymphs Abs: 0.6 10*3/uL — ABNORMAL LOW (ref 0.7–4.0)
MCHC: 33.7 g/dL (ref 30.0–36.0)
MCV: 101.7 fl — ABNORMAL HIGH (ref 78.0–100.0)
Monocytes Absolute: 0.8 10*3/uL (ref 0.1–1.0)
Monocytes Relative: 16.7 % — ABNORMAL HIGH (ref 3.0–12.0)
Neutro Abs: 3.2 10*3/uL (ref 1.4–7.7)
Neutrophils Relative %: 68.6 % (ref 43.0–77.0)
Platelets: 152 10*3/uL (ref 150.0–400.0)
RBC: 3.43 Mil/uL — ABNORMAL LOW (ref 4.22–5.81)
RDW: 14.5 % (ref 11.5–15.5)
WBC: 4.6 10*3/uL (ref 4.0–10.5)

## 2018-11-16 LAB — COMPREHENSIVE METABOLIC PANEL
ALT: 15 U/L (ref 0–53)
AST: 28 U/L (ref 0–37)
Albumin: 3.6 g/dL (ref 3.5–5.2)
Alkaline Phosphatase: 54 U/L (ref 39–117)
BUN: 56 mg/dL — ABNORMAL HIGH (ref 6–23)
CO2: 26 mEq/L (ref 19–32)
Calcium: 8.7 mg/dL (ref 8.4–10.5)
Chloride: 104 mEq/L (ref 96–112)
Creatinine, Ser: 2 mg/dL — ABNORMAL HIGH (ref 0.40–1.50)
GFR: 31.77 mL/min — ABNORMAL LOW (ref >60.00)
Glucose, Bld: 94 mg/dL (ref 70–99)
Potassium: 3.9 mEq/L (ref 3.5–5.1)
Sodium: 139 mEq/L (ref 135–145)
Total Bilirubin: 0.8 mg/dL (ref 0.2–1.2)
Total Protein: 6.6 g/dL (ref 6.0–8.3)

## 2018-11-16 LAB — POC URINALSYSI DIPSTICK (AUTOMATED)
Bilirubin, UA: NEGATIVE
Blood, UA: NEGATIVE
Glucose, UA: NEGATIVE
Ketones, UA: NEGATIVE
Leukocytes, UA: NEGATIVE
Nitrite, UA: NEGATIVE
Protein, UA: POSITIVE — AB
Spec Grav, UA: 1.015 (ref 1.010–1.025)
Urobilinogen, UA: NEGATIVE E.U./dL — AB
pH, UA: 5 (ref 5.0–8.0)

## 2018-11-16 LAB — LIPASE: Lipase: 20 U/L (ref 11.0–59.0)

## 2018-11-16 LAB — IRON: Iron: 26 ug/dL — ABNORMAL LOW (ref 42–165)

## 2018-11-16 LAB — POCT HEMOGLOBIN: Hemoglobin: 9.6 g/dL — AB (ref 11–14.6)

## 2018-11-16 LAB — TSH: TSH: 0.93 u[IU]/mL (ref 0.35–4.50)

## 2018-11-16 MED ORDER — BETAMETHASONE VALERATE 0.1 % EX OINT: 1.0000 "application " | TOPICAL_OINTMENT | Freq: Two times a day (BID) | CUTANEOUS | 0 refills | Status: DC | PRN

## 2018-11-16 NOTE — Patient Instructions (Addendum)
You may apply steroid cream to skin rash twice daily as needed for itching. Please complete lab work prior to leaving. Complete chest x-ray on the first floor. Continue to stay well hydrated. Go to the ER if you develop increased weakness, or if you are unable to keep down food/liquid. Stop Furosemide for now.

## 2018-11-16 NOTE — Progress Notes (Signed)
Subjective:    Patient ID: Javier [name], male    DOB: May 12, 1929, 85 y.o.   MRN: [id number]  HPI  Patient is an 85 yr old male who presents today with chief complaint of weight loss weakness. Weakness started 1 week ago.  Weight 11/18 was 200 lbs. Reports right shoulder pain with decrease mobility since a fall the week before christmas.   He reports + anorexia for 1 week.  He is drinking water/gatorade. He has been working. Last day of work was Sunday.  He has not returned.   Wt Readings from Last 3 Encounters:  04/25/18 175 lb (79.4 kg)  04/18/18 182 lb (82.6 kg)  04/05/18 185 lb 6 oz (84.1 kg)     Review of Systems  Constitutional: Negative for unexpected weight change.  Respiratory:       Mild cough x 2 days.    Gastrointestinal: Negative for blood in stool and nausea.       Denies melena  Genitourinary: Negative for difficulty urinating, dysuria and frequency.       Past Medical History:  Diagnosis Date   Atrial fibrillation (HCC)    Bronchitis    CAD (coronary artery disease)    Chronic renal insufficiency    Congestive heart failure (HCC)    Diabetes mellitus type II    GERD (gastroesophageal reflux disease)    Gout    Hyperlipidemia    Hypertension    Leg cramps    Osteoarthritis    Shingles      Social History   Socioeconomic History   Marital status: Legally Separated    Spouse name: Not on file   Number of children: Not on file   Years of education: Not on file   Highest education level: Not on file  Occupational History   Not on file  Social Needs   Financial resource strain: Not on file   Food insecurity    Worry: Not on file    Inability: Not on file   Transportation needs    Medical: Not on file    Non-medical: Not on file  Tobacco Use   Smoking status: Former Smoker   Smokeless tobacco: Never Used   Tobacco comment: quit 40 years ago-40 pack year history  Substance and Sexual Activity   Alcohol use: Yes    Comment: drinks liquor on occassion.   Drug use: No   Sexual activity: Not on file  Lifestyle   Physical activity    Days per week: Not on file    Minutes per session: Not on file   Stress: Not on file  Relationships   Social connections    Talks on phone: Not on file    Gets together: Not on file    Attends religious service: Not on file    Active member of club or organization: Not on file    Attends meetings of clubs or organizations: Not on file    Relationship status: Not on file   Intimate partner violence    Fear of current or ex partner: Not on file    Emotionally abused: Not on file    Physically abused: Not on file    Forced sexual activity: Not on file  Other Topics Concern   Not on file  Social History Narrative   Last updated: 02/13/2010   Married but separated from wife   Alcohol use-yes   Former Smoker quit 74 yrs ago (23 pack yr history)  Works part time at [employer] [place] [employer] [place] [employer]    Past Surgical History:  Procedure Laterality Date   CARDIOVERSION  11/02/2005   s/p   CORONARY ARTERY BYPASS GRAFT  05/18/2005   EYE SURGERY     CATARACT SX 06/2015 both eyes Dr.Beavis per pt   EYE SURGERY Bilateral 12/09/2016   revision of cataract surgery from 2017.   MITRAL VALVE REPAIR  04/2005   s/p mitral valve repair    Family History  Problem Relation Age of Onset   Diabetes Unknown        siblings   Melanoma Brother        died at 1691    No Known Allergies  Current Outpatient Medications on File Prior to Visit  Medication Sig Dispense Refill   allopurinol (ZYLOPRIM) 100 MG tablet Take 1 tablet (100 mg total) by mouth daily. 90 tablet 1   amLODipine (NORVASC) 10 MG tablet Take 1 tablet (10 mg total) by mouth daily. 90 tablet 1   aspirin 81 MG tablet Take 81 mg by mouth daily.       carvedilol (COREG) 6.25 MG tablet TAKE 1 TABLET BY MOUTH TWICE DAILY WITH MEALS 180 tablet 1   colchicine 0.6 MG tablet 2 tabs by mouth at  start of gout flare.  Then one tablet 1 hour later. 6 tablet 5   folic acid (FOLVITE) 1 MG tablet Take 1 tablet (1 mg total) by mouth daily. 30 tablet 5   furosemide (LASIX) 20 MG tablet Take 1 tablet (20 mg total) by mouth daily. 90 tablet 1   lisinopril-hydrochlorothiazide (PRINZIDE,ZESTORETIC) 20-25 MG tablet Take 1 tablet by mouth daily. 90 tablet 1   lovastatin (MEVACOR) 40 MG tablet Take 1 tablet (40 mg total) by mouth daily. 90 tablet 1   Omega-3 Fatty Acids (FISH OIL) 1000 MG CAPS Take 2 capsules (2,000 mg total) by mouth 2 (two) times daily.  0   No current facility-administered medications on file prior to visit.     BP (!) 96/50 (BP Location: Right Arm, Patient Position: Sitting, Cuff Size: Small)    Pulse 68    Temp 98.6 F (37 C) (Oral)    Resp 16    Ht 5\' 8"  (1.727 m)    Wt 152 lb 3.2 oz (69 kg)    SpO2 99%    BMI 23.14 kg/m    Objective:   Physical Exam Constitutional:      General: He is not in acute distress (weak appearing).    Appearance: Normal appearance. He is normal weight.  Cardiovascular:     Rate and Rhythm: Normal rate. Rhythm irregular.     Comments: Trace bilateral LE edema Pulmonary:     Effort: Pulmonary effort is normal.     Breath sounds: Normal breath sounds.  Genitourinary:    Rectum: Normal. Guaiac result negative.     Comments: Smooth enlarged prostate without any nodules Skin:    Comments: Mild rash noted on left forearm  Neurological:     Mental Status: He is alert.           Assessment & Plan:  Skin rash- will rx with hydrocortisone.  HTN- bp is low and I think he may be a bit dry. Will hold lasix for now. Plan to recheck in 1 week.  Weakness/weight loss- UA unremarkable (except note of protein).  Check CMET, TSH, lipase, CBC diff.  Anemia- check CBC, serum iron.   Renal insufficiency- check creatinine  Atrial fibrillation- EKG tracing is personally reviewed.  EKG notes NSR.  No acute changes.

## 2018-11-16 NOTE — Telephone Encounter (Signed)
[facility]

## 2018-11-16 NOTE — Telephone Encounter (Signed)
Spoke to patient- he has lost 40 pounds. Reports no energy at all.  Reports that he has felt increased weakness the last few days.  Reports that he is down to 160 lbs.  Reports that weakness started about 1 week ago.  I advised him to come to the office today at 11:20 AM to see me. Pt verbalizes understanding.

## 2018-11-16 NOTE — Telephone Encounter (Signed)
[facility] does not have this medication in stock. Patient requests it be resent to  [id number] - [place], [place] - [address] [phone] (Phone) [phone] (Fax

## 2018-11-17 ENCOUNTER — Telehealth: Payer: Self-pay | Admitting: Family

## 2018-11-17 MED ORDER — IRON 325 (65 FE) MG PO TABS: 1.0000 | ORAL_TABLET | Freq: Two times a day (BID) | ORAL | 0 refills | Status: DC

## 2018-11-17 MED ORDER — MULTI-VITAMIN/MINERALS PO TABS: 1.0000 | ORAL_TABLET | Freq: Every day | ORAL | Status: AC

## 2018-11-17 NOTE — Telephone Encounter (Signed)
Please contact pt and let him know that his lab work shows mild anemia.  I would like him to add a multivitamin once daily as well as iron 325mg  one tablet twice daily.    Chest xray looks good.    Does he drink alcohol? If so, how much?   Follow up in 1 week as scheduled, call sooner if new/worsening symptoms.

## 2018-11-17 NOTE — Telephone Encounter (Signed)
fyi Patient advised of results and provider's advise.  He reports he drinks maybe one beer twice a mont.

## 2018-11-22 ENCOUNTER — Ambulatory Visit: Payer: Medicare Other | Admitting: Family

## 2018-11-25 ENCOUNTER — Ambulatory Visit (HOSPITAL_BASED_OUTPATIENT_CLINIC_OR_DEPARTMENT_OTHER)
Admission: RE | Admit: 2018-11-25 | Discharge: 2018-11-25 | Disposition: A | Discharge status: Home / Self Care | Payer: Medicare Other | Source: Ambulatory Visit | Attending: Family | Admitting: Family

## 2018-11-25 ENCOUNTER — Ambulatory Visit (INDEPENDENT_AMBULATORY_CARE_PROVIDER_SITE_OTHER): Payer: Medicare Other | Admitting: Family

## 2018-11-25 ENCOUNTER — Other Ambulatory Visit: Payer: Self-pay

## 2018-11-25 ENCOUNTER — Encounter: Payer: Self-pay | Admitting: Family

## 2018-11-25 ENCOUNTER — Other Ambulatory Visit: Payer: Self-pay | Admitting: *Deleted

## 2018-11-25 ENCOUNTER — Telehealth: Payer: Self-pay | Admitting: Family

## 2018-11-25 VITALS — BP 102/61 | HR 82 | Temp 97.7°F | Resp 16 | Ht 68.0 in | Wt 149.2 lb

## 2018-11-25 DIAGNOSIS — R9389 Abnormal findings on diagnostic imaging of other specified body structures: Secondary | ICD-10-CM | POA: Insufficient documentation

## 2018-11-25 DIAGNOSIS — R6889 Other general symptoms and signs: Secondary | ICD-10-CM | POA: Diagnosis not present

## 2018-11-25 DIAGNOSIS — Z20822 Contact with and (suspected) exposure to covid-19: Secondary | ICD-10-CM

## 2018-11-25 DIAGNOSIS — Z125 Encounter for screening for malignant neoplasm of prostate: Secondary | ICD-10-CM

## 2018-11-25 DIAGNOSIS — R109 Unspecified abdominal pain: Secondary | ICD-10-CM | POA: Diagnosis not present

## 2018-11-25 DIAGNOSIS — D509 Iron deficiency anemia, unspecified: Secondary | ICD-10-CM | POA: Diagnosis not present

## 2018-11-25 DIAGNOSIS — R634 Abnormal weight loss: Secondary | ICD-10-CM | POA: Insufficient documentation

## 2018-11-25 DIAGNOSIS — L89152 Pressure ulcer of sacral region, stage 2: Secondary | ICD-10-CM

## 2018-11-25 DIAGNOSIS — R432 Parageusia: Secondary | ICD-10-CM

## 2018-11-25 DIAGNOSIS — R918 Other nonspecific abnormal finding of lung field: Secondary | ICD-10-CM | POA: Diagnosis not present

## 2018-11-25 LAB — PSA, MEDICARE: PSA: 3.71 ng/ml (ref 0.10–4.00)

## 2018-11-25 MED ORDER — AZITHROMYCIN 250 MG PO TABS: ORAL_TABLET | ORAL | 0 refills | Status: DC

## 2018-11-25 NOTE — Patient Instructions (Signed)
Please complete lab work prior to leaving then head down to imaging on the first floor. Go to [facility] before 3:45 today for covid testing.

## 2018-11-25 NOTE — Progress Notes (Signed)
Subjective:    Patient ID: Javier [name], male    DOB: 1929-09-22, 85 y.o.   MRN: [id number]  HPI  Patient is an 85 yr old male who presents today for follow up.  He is accompanied by his daughter Javier [name] and his caregiver Javier [name] today.  They are are both very concerned with his recent rapid weight loss and poor appetite.  Weight loss- having progressive weight loss.  He reports that food just does not have a good taste.  He does not think his sense of smell is very good either.  He is drinking water but eating very little.  Last visit we obtained cxr which noted atelectasis. Lab work revealed euthyroid, renal insufficiency (at baseline), iron deficiency anemia with hgb at 11.8. UA did not suggest infection.  Wt Readings from Last 3 Encounters:  11/25/18 149 lb 3.2 oz (67.7 kg)  11/16/18 152 lb 3.2 oz (69 kg)  04/25/18 175 lb (79.4 kg)   HTN- last visit bp was low and we held lasix.  He reports that he has stayed off of Lasix since last week. BP Readings from Last 3 Encounters:  11/25/18 102/61  11/16/18 (!) 96/50  04/25/18 113/63   Anemia-an iron supplement was added last week.  Lab Results  Component Value Date   WBC 4.6 11/16/2018   HGB 11.8 (L) 11/16/2018   HCT 34.9 (L) 11/16/2018   MCV 101.7 (H) 11/16/2018   PLT 152.0 11/16/2018   Renal insufficiency-  Lab Results  Component Value Date   CREATININE 2.00 (H) 11/16/2018   Skin ulcer- family is concerned about skin ulcer.    Review of Systems    see HPI  Past Medical History:  Diagnosis Date  . Atrial fibrillation ([place])   . Bronchitis   . CAD (coronary artery disease)   . Chronic renal insufficiency   . Congestive heart failure ([place])   . Diabetes mellitus type II   . GERD (gastroesophageal reflux disease)   . Gout   . Hyperlipidemia   . Hypertension   . Leg cramps   . Osteoarthritis   . Shingles      Social History   Socioeconomic History  . Marital status: Legally Separated    Spouse name: Not on  file  . Number of children: Not on file  . Years of education: Not on file  . Highest education level: Not on file  Occupational History  . Not on file  Social Needs  . Financial resource strain: Not on file  . Food insecurity    Worry: Not on file    Inability: Not on file  . Transportation needs    Medical: Not on file    Non-medical: Not on file  Tobacco Use  . Smoking status: Former Research scientist (life sciences)  . Smokeless tobacco: Never Used  . Tobacco comment: quit 40 years ago-40 pack year history  Substance and Sexual Activity  . Alcohol use: Yes    Comment: drinks liquor on occassion.  . Drug use: No  . Sexual activity: Not on file  Lifestyle  . Physical activity    Days per week: Not on file    Minutes per session: Not on file  . Stress: Not on file  Relationships  . Social Herbalist on phone: Not on file    Gets together: Not on file    Attends religious service: Not on file    Active member of club or organization: Not on file  Attends meetings of clubs or organizations: Not on file    Relationship status: Not on file  . Intimate partner violence    Fear of current or ex partner: Not on file    Emotionally abused: Not on file    Physically abused: Not on file    Forced sexual activity: Not on file  Other Topics Concern  . Not on file  Social History Narrative   Last updated: 02/13/2010   Married but separated from wife   Alcohol use-yes   Former Smoker quit 40 yrs ago (40 pack yr history)     Works part time at [employer] [place] [employer]    Past Surgical History:  Procedure Laterality Date  . CARDIOVERSION  11/02/2005   s/p  . CORONARY ARTERY BYPASS GRAFT  05/18/2005  . EYE SURGERY     CATARACT SX 06/2015 both eyes Dr.Beavis per pt  . EYE SURGERY Bilateral 12/09/2016   revision of cataract surgery from 2017.  [facility]. MITRAL VALVE REPAIR  04/2005   s/p mitral valve repair    Family History  Problem Relation Age of Onset  . Diabetes Other         siblings  . Melanoma Brother        died at 6091    No Known Allergies  Current Outpatient Medications on File Prior to Visit  Medication Sig Dispense Refill  . allopurinol (ZYLOPRIM) 100 MG tablet Take 1 tablet (100 mg total) by mouth daily. 90 tablet 1  . amLODipine (NORVASC) 10 MG tablet Take 1 tablet (10 mg total) by mouth daily. 90 tablet 1  . betamethasone valerate ointment (VALISONE) 0.1 % Apply 1 application topically 2 (two) times daily as needed. For rash 30 g 0  . carvedilol (COREG) 6.25 MG tablet TAKE 1 TABLET BY MOUTH TWICE DAILY WITH MEALS 180 tablet 1  . colchicine 0.6 MG tablet 2 tabs by mouth at start of gout flare.  Then one tablet 1 hour later. 6 tablet 5  . Ferrous Sulfate (IRON) 325 (65 Fe) MG TABS Take 1 tablet (325 mg total) by mouth 2 (two) times a day. 30 tablet 0  . folic acid (FOLVITE) 1 MG tablet Take 1 tablet (1 mg total) by mouth daily. 30 tablet 5  . lisinopril-hydrochlorothiazide (PRINZIDE,ZESTORETIC) 20-25 MG tablet Take 1 tablet by mouth daily. 90 tablet 1  . lovastatin (MEVACOR) 40 MG tablet Take 1 tablet (40 mg total) by mouth daily. 90 tablet 1  . Multiple Vitamins-Minerals (MULTIVITAMIN WITH MINERALS) tablet Take 1 tablet by mouth daily.    . Omega-3 Fatty Acids (FISH OIL) 1000 MG CAPS Take 2 capsules (2,000 mg total) by mouth 2 (two) times daily.  0  . aspirin 81 MG tablet Take 81 mg by mouth daily.      . furosemide (LASIX) 20 MG tablet Take 1 tablet (20 mg total) by mouth daily. (Patient not taking: Reported on 11/25/2018) 90 tablet 1   No current facility-administered medications on file prior to visit.     BP 102/61   Pulse 82   Temp 97.7 F (36.5 C) (Oral)   Resp 16   Ht 5\' 8"  (1.727 m)   Wt 149 lb 3.2 oz (67.7 kg)   SpO2 98%   BMI 22.69 kg/m    Objective:   Physical Exam Constitutional:      General: He is not in acute distress.    Appearance: He is well-developed.  HENT:  Head: Normocephalic and atraumatic.  Cardiovascular:      Rate and Rhythm: Normal rate and regular rhythm.     Heart sounds: No murmur.  Pulmonary:     Effort: Pulmonary effort is normal. No respiratory distress.     Breath sounds: Normal breath sounds. No wheezing or rales.  Skin:    General: Skin is warm and dry.     Comments: Stage 2 sacral pressure ulcer  Neurological:     Mental Status: He is alert and oriented to person, place, and time.  Psychiatric:        Behavior: Behavior normal.        Thought Content: Thought content normal.           Assessment & Plan:  Unexplained weight loss- add boost 2-3 cans once daily.  Concerning for underlying malignancy.  Obtain CT chest/abdomen and pelvis (no IV contrast due to renal insufficiency). Further recommendations following review of imaging results.  Family would also like the patient tested for COVID-19 (I think this is unlikely clinically though) and I have placed this order and given them instructions on our testing location/hours. Will also check PSA.   Pressure ulcer- advised family to purchase a foam cushion for his chair and to encourage frequent repositioning. Recommended that they apply duoderm but daughter declines "it won't stay put there, it will roll up."    Anemia- continue iron supplement.  Likely related to poor nutritional intake as well as his renal insufficiency.  CHF- appears euvolemic off of the furosemide- advised pt to remain off of furosemide.

## 2018-11-25 NOTE — Telephone Encounter (Signed)
Reviewed CT results with pt and daughter. Advised that pt should start zpak, go to ER if SOB, increased weakness.  Will await covid-19 results.

## 2018-11-27 ENCOUNTER — Telehealth: Payer: Self-pay | Admitting: Family Medicine

## 2018-11-27 LAB — NOVEL CORONAVIRUS, NAA: SARS-CoV-2, NAA: DETECTED — AB

## 2018-11-27 NOTE — Telephone Encounter (Signed)
Spoke w pt's caregiver regarding results. Warning signs and symptoms verbalized for when to seek care. Quarantining recommended.  All questions answered.

## 2018-11-29 ENCOUNTER — Telehealth: Payer: Self-pay | Admitting: Family

## 2018-11-29 NOTE — Telephone Encounter (Signed)
Tried to call pt to check  Up on him, no answer. Spoke to daughter, she has not talked to patient today.

## 2018-11-30 NOTE — Telephone Encounter (Signed)
Spoke to pt. States that he is feeling much better. Denies SOB/fever. Reports improved appetite and PO intake.

## 2018-12-13 ENCOUNTER — Ambulatory Visit (HOSPITAL_BASED_OUTPATIENT_CLINIC_OR_DEPARTMENT_OTHER)
Admission: RE | Admit: 2018-12-13 | Discharge: 2018-12-13 | Disposition: A | Discharge status: Home / Self Care | Payer: Medicare Other | Source: Ambulatory Visit | Attending: Family | Admitting: Family

## 2018-12-13 ENCOUNTER — Other Ambulatory Visit: Payer: Self-pay

## 2018-12-13 ENCOUNTER — Encounter: Payer: Self-pay | Admitting: Family

## 2018-12-13 ENCOUNTER — Ambulatory Visit (INDEPENDENT_AMBULATORY_CARE_PROVIDER_SITE_OTHER): Payer: Medicare Other | Admitting: Family

## 2018-12-13 VITALS — BP 108/62 | HR 70 | Temp 97.3°F | Resp 16 | Wt 154.8 lb

## 2018-12-13 DIAGNOSIS — E782 Mixed hyperlipidemia: Secondary | ICD-10-CM

## 2018-12-13 DIAGNOSIS — E538 Deficiency of other specified B group vitamins: Secondary | ICD-10-CM | POA: Diagnosis not present

## 2018-12-13 DIAGNOSIS — D7589 Other specified diseases of blood and blood-forming organs: Secondary | ICD-10-CM | POA: Diagnosis not present

## 2018-12-13 DIAGNOSIS — J189 Pneumonia, unspecified organism: Secondary | ICD-10-CM

## 2018-12-13 DIAGNOSIS — H6091 Unspecified otitis externa, right ear: Secondary | ICD-10-CM | POA: Diagnosis not present

## 2018-12-13 DIAGNOSIS — E118 Type 2 diabetes mellitus with unspecified complications: Secondary | ICD-10-CM

## 2018-12-13 DIAGNOSIS — D649 Anemia, unspecified: Secondary | ICD-10-CM

## 2018-12-13 DIAGNOSIS — U071 COVID-19: Secondary | ICD-10-CM

## 2018-12-13 LAB — B12 AND FOLATE PANEL
Folate: 12.4 ng/mL (ref >5.9)
Vitamin B-12: 339 pg/mL (ref 211–911)

## 2018-12-13 LAB — HEMOGLOBIN A1C: Hgb A1c MFr Bld: 6 % (ref 4.6–6.5)

## 2018-12-13 LAB — IRON: Iron: 63 ug/dL (ref 42–165)

## 2018-12-13 MED ORDER — NEOMYCIN-POLYMYXIN-HC 3.5-10000-1 OT SOLN: 3.0000 [drp] | Freq: Three times a day (TID) | OTIC | 0 refills | Status: AC

## 2018-12-13 NOTE — Progress Notes (Addendum)
Subjective:    Patient ID: Javier MohsJames F [name], male    DOB: 1929-06-05, 85 y.o.   MRN: [id number]  HPI  Patient is an 85 yr old male who presents today for follow up after his recent covid-19 infection. We last saw the patient on August 7 at which time he reported weakness anorexia loss of taste, and weight loss.Reports that his appetite is much improved.  Denies SOB or cough, denies fever.  The patient was sent for COVID-19 testing which was positive.  CT scan of the chest revealed bilateral groundglass opacities.  He was treated with a azithromycin for presumed COVID pneumonia.  Since his last visit he reports feeling much improved.  Reports voracious appetite.  His energy is improving.  He denies cough, shortness of breath, or fever.  Reports that he itches all the time.  He does use [place] soap. Wt Readings from Last 3 Encounters:  12/13/18 154 lb 12.8 oz (70.2 kg)  11/25/18 149 lb 3.2 oz (67.7 kg)  11/16/18 152 lb 3.2 oz (69 kg)    DM2-this has been diet controlled. Lab Results  Component Value Date   HGBA1C 6.0 04/18/2018   HGBA1C 6.2 12/14/2017   HGBA1C 5.9 09/07/2017   Lab Results  Component Value Date   MICROALBUR <0.7 12/25/2014   LDLCALC 41 04/18/2018   CREATININE 2.00 (H) 11/16/2018   HTN-blood pressure medication includes carvedilol 6.25 mg twice daily, lisinopril hydrochlorothiazide 20-25. BP Readings from Last 3 Encounters:  12/13/18 108/62  11/25/18 102/61  11/16/18 (!) 96/50   Gout-he reports that he did have a gout flare a couple weeks back and took a round of colchicine with complete resolution of his symptoms.  Anemia-he is maintained on an iron supplement. Lab Results  Component Value Date   WBC 4.6 11/16/2018   HGB 11.8 (L) 11/16/2018   HCT 34.9 (L) 11/16/2018   MCV 101.7 (H) 11/16/2018   PLT 152.0 11/16/2018      Review of Systems See HPI      Past Medical History:  Diagnosis Date  . Atrial fibrillation (HCC)   . Bronchitis   . CAD  (coronary artery disease)   . Chronic renal insufficiency   . Congestive heart failure (HCC)   . Diabetes mellitus type II   . GERD (gastroesophageal reflux disease)   . Gout   . Hyperlipidemia   . Hypertension   . Leg cramps   . Osteoarthritis   . Shingles      Social History   Socioeconomic History  . Marital status: Legally Separated    Spouse name: Not on file  . Number of children: Not on file  . Years of education: Not on file  . Highest education level: Not on file  Occupational History  . Not on file  Social Needs  . Financial resource strain: Not on file  . Food insecurity    Worry: Not on file    Inability: Not on file  . Transportation needs    Medical: Not on file    Non-medical: Not on file  Tobacco Use  . Smoking status: Former Games developermoker  . Smokeless tobacco: Never Used  . Tobacco comment: quit 40 years ago-40 pack year history  Substance and Sexual Activity  . Alcohol use: Yes    Comment: drinks liquor on occassion.  . Drug use: No  . Sexual activity: Not on file  Lifestyle  . Physical activity    Days per week: Not on file  Minutes per session: Not on file  . Stress: Not on file  Relationships  . Social Herbalist on phone: Not on file    Gets together: Not on file    Attends religious service: Not on file    Active member of club or organization: Not on file    Attends meetings of clubs or organizations: Not on file    Relationship status: Not on file  . Intimate partner violence    Fear of current or ex partner: Not on file    Emotionally abused: Not on file    Physically abused: Not on file    Forced sexual activity: Not on file  Other Topics Concern  . Not on file  Social History Narrative   Last updated: 02/13/2010   Married but separated from wife   Alcohol use-yes   Former Smoker quit 47 yrs ago (6 pack yr history)     Works part time at Retail buyer store near [employer]    Past Surgical History:  Procedure  Laterality Date  . CARDIOVERSION  11/02/2005   s/p  . CORONARY ARTERY BYPASS GRAFT  05/18/2005  . EYE SURGERY     CATARACT SX 06/2015 both eyes Dr.Beavis per pt  . EYE SURGERY Bilateral 12/09/2016   revision of cataract surgery from 2017.  Marland Kitchen MITRAL VALVE REPAIR  04/2005   s/p mitral valve repair    Family History  Problem Relation Age of Onset  . Diabetes Other        siblings  . Melanoma Brother        died at 41    No Known Allergies  Current Outpatient Medications on File Prior to Visit  Medication Sig Dispense Refill  . allopurinol (ZYLOPRIM) 100 MG tablet Take 1 tablet (100 mg total) by mouth daily. 90 tablet 1  . amLODipine (NORVASC) 10 MG tablet Take 1 tablet (10 mg total) by mouth daily. 90 tablet 1  . aspirin 81 MG tablet Take 81 mg by mouth daily.      Marland Kitchen azithromycin (ZITHROMAX) 250 MG tablet 2 tabs by mouth today, then 1 tab once daily for 4 more days 6 tablet 0  . betamethasone valerate ointment (VALISONE) 0.1 % Apply 1 application topically 2 (two) times daily as needed. For rash 30 g 0  . carvedilol (COREG) 6.25 MG tablet TAKE 1 TABLET BY MOUTH TWICE DAILY WITH MEALS 180 tablet 1  . colchicine 0.6 MG tablet 2 tabs by mouth at start of gout flare.  Then one tablet 1 hour later. 6 tablet 5  . Ferrous Sulfate (IRON) 325 (65 Fe) MG TABS Take 1 tablet (325 mg total) by mouth 2 (two) times a day. 30 tablet 0  . folic acid (FOLVITE) 1 MG tablet Take 1 tablet (1 mg total) by mouth daily. 30 tablet 5  . furosemide (LASIX) 20 MG tablet Take 1 tablet (20 mg total) by mouth daily. (Patient not taking: Reported on 11/25/2018) 90 tablet 1  . lisinopril-hydrochlorothiazide (PRINZIDE,ZESTORETIC) 20-25 MG tablet Take 1 tablet by mouth daily. 90 tablet 1  . lovastatin (MEVACOR) 40 MG tablet Take 1 tablet (40 mg total) by mouth daily. 90 tablet 1  . Multiple Vitamins-Minerals (MULTIVITAMIN WITH MINERALS) tablet Take 1 tablet by mouth daily.    . Omega-3 Fatty Acids (FISH OIL) 1000 MG CAPS  Take 2 capsules (2,000 mg total) by mouth 2 (two) times daily.  0   No current facility-administered medications on  file prior to visit.     There were no vitals taken for this visit.   Objective:   Physical Exam Constitutional:      General: He is not in acute distress.    Appearance: He is well-developed.  HENT:     Head: Normocephalic and atraumatic.     Comments: Mild erythema of R ear canal,, + white/watery drainage in ear canal noted.    Right Ear: Tympanic membrane normal.     Left Ear: Tympanic membrane and ear canal normal.  Cardiovascular:     Rate and Rhythm: Normal rate and regular rhythm.     Heart sounds: No murmur.  Pulmonary:     Effort: Pulmonary effort is normal. No respiratory distress.     Breath sounds: Normal breath sounds. No wheezing or rales.  Musculoskeletal:     Right lower leg: 2+ Edema present.     Left lower leg: 2+ Edema present.  Skin:    General: Skin is warm and dry.  Neurological:     Mental Status: He is alert and oriented to person, place, and time.  Psychiatric:        Behavior: Behavior normal.        Thought Content: Thought content normal.           Assessment & Plan:  COVID-19-clinically resolved.  Will obtain follow-up chest x-ray to ensure resolution of pneumonia changes.  His weight is coming up nicely.  We will continue to monitor.  Diabetes type 2-clinically stable, obtain follow-up A1c.  Hypertension- blood pressure is stable.  Continue current medications.  Gout-currently stable.  Continue allopurinol as well as PRN colchicine.  Anemia- he is noted to have a macrocytosis.  He has had a B12 done in the past which is in the low normal range.  Will recheck B12 and folate.  Hyperlipidemia- tolerating statin, obtain follow up lipid panel.   Right otitis externa- will rx with cortisporin otic.

## 2018-12-13 NOTE — Patient Instructions (Addendum)
Please complete lab work prior to leaving.  Complete chest x-ray on the first floor.  

## 2018-12-14 ENCOUNTER — Other Ambulatory Visit: Payer: Self-pay | Admitting: Family

## 2018-12-16 ENCOUNTER — Telehealth: Payer: Self-pay

## 2018-12-16 NOTE — Telephone Encounter (Signed)
Copied from [place] [id number]. Topic: General - Other >> Dec 13, 2018  5:11 PM Alanda Slim E wrote: Reason for CRM: Ms. Orvan Seen called and stated she missed a call from Debbrah Alar / please advise

## 2018-12-16 NOTE — Telephone Encounter (Signed)
See cxr result note message.

## 2019-02-13 ENCOUNTER — Other Ambulatory Visit: Payer: Self-pay | Admitting: Family

## 2019-02-20 ENCOUNTER — Ambulatory Visit (INDEPENDENT_AMBULATORY_CARE_PROVIDER_SITE_OTHER): Payer: Medicare Other | Admitting: Family

## 2019-02-20 ENCOUNTER — Other Ambulatory Visit: Payer: Self-pay

## 2019-02-20 DIAGNOSIS — M109 Gout, unspecified: Secondary | ICD-10-CM | POA: Diagnosis not present

## 2019-02-20 MED ORDER — PREDNISONE 10 MG PO TABS: ORAL_TABLET | ORAL | 0 refills | Status: DC

## 2019-02-20 NOTE — Progress Notes (Signed)
Virtual Visit via Video Note  I connected with Javier [name] on 02/20/19 at  8:40 AM EST by a video enabled telemedicine application and verified that I am speaking with the correct person using two identifiers.  Location: Patient: home Provider: homw   I discussed the limitations of evaluation and management by telemedicine and the availability of in person appointments. The patient expressed understanding and agreed to proceed.  History of Present Illness:  Patient is an 85 yr old male who presents today with chief complaint of right elbow pain. He reports that right elbow began to hurt on 10/30.  Denies known injury. Reports elbow is swollen and warm to the touch. Hurts to bend elbow or lift his arm. Has hx of gout and reports good compliance with his allopurinol.   Past Medical History:  Diagnosis Date  . Atrial fibrillation (HCC)   . Bronchitis   . CAD (coronary artery disease)   . Chronic renal insufficiency   . Congestive heart failure (HCC)   . Diabetes mellitus type II   . GERD (gastroesophageal reflux disease)   . Gout   . Hyperlipidemia   . Hypertension   . Leg cramps   . Osteoarthritis   . Shingles      Social History   Socioeconomic History  . Marital status: Legally Separated    Spouse name: Not on file  . Number of children: Not on file  . Years of education: Not on file  . Highest education level: Not on file  Occupational History  . Not on file  Social Needs  . Financial resource strain: Not on file  . Food insecurity    Worry: Not on file    Inability: Not on file  . Transportation needs    Medical: Not on file    Non-medical: Not on file  Tobacco Use  . Smoking status: Former Games developer  . Smokeless tobacco: Never Used  . Tobacco comment: quit 40 years ago-40 pack year history  Substance and Sexual Activity  . Alcohol use: Yes    Comment: drinks liquor on occassion.  . Drug use: No  . Sexual activity: Not on file  Lifestyle  . Physical  activity    Days per week: Not on file    Minutes per session: Not on file  . Stress: Not on file  Relationships  . Social Musician on phone: Not on file    Gets together: Not on file    Attends religious service: Not on file    Active member of club or organization: Not on file    Attends meetings of clubs or organizations: Not on file    Relationship status: Not on file  . Intimate partner violence    Fear of current or ex partner: Not on file    Emotionally abused: Not on file    Physically abused: Not on file    Forced sexual activity: Not on file  Other Topics Concern  . 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 CARDIOVERSION  11/02/2005   s/p  . CORONARY ARTERY BYPASS GRAFT  05/18/2005  . EYE SURGERY     CATARACT  SX 06/2015 both eyes Dr.Beavis per pt  . EYE SURGERY Bilateral 12/09/2016   revision of cataract surgery from 2017.  Marland Kitchen MITRAL VALVE REPAIR  04/2005   s/p mitral valve repair    Family History  Problem Relation Age of Onset  . Diabetes Other        siblings  . Melanoma Brother        died at 30    No Known Allergies  Current Outpatient Medications on File Prior to Visit  Medication Sig Dispense Refill  . allopurinol (ZYLOPRIM) 100 MG tablet TAKE 1 TABLET BY MOUTH DAILY 90 tablet 1  . amLODipine (NORVASC) 10 MG tablet Take 1 tablet (10 mg total) by mouth daily. 90 tablet 1  . aspirin 81 MG tablet Take 81 mg by mouth daily.      . betamethasone valerate ointment (VALISONE) 0.1 % Apply 1 application topically 2 (two) times daily as needed. For rash 30 g 0  . carvedilol (COREG) 6.25 MG tablet TAKE 1 TABLET BY MOUTH TWICE DAILY WITH MEALS 180 tablet 1  . colchicine 0.6 MG tablet 2 tabs by mouth at  start of gout flare.  Then one tablet 1 hour later. 6 tablet 5  . folic acid (FOLVITE) 1 MG tablet Take 1 tablet (1 mg total) by mouth daily. 30 tablet 5  . furosemide (LASIX) 20 MG tablet Take 1 tablet (20 mg total) by mouth daily. 90 tablet 1  . lisinopril-hydrochlorothiazide (ZESTORETIC) 20-25 MG tablet TAKE 1 TABLET BY MOUTH DAILY 90 tablet 1  . lovastatin (MEVACOR) 40 MG tablet TAKE 1 TABLET BY MOUTH DAILY 90 tablet 1  . Multiple Vitamins-Minerals (MULTIVITAMIN WITH MINERALS) tablet Take 1 tablet by mouth daily.    . Omega-3 Fatty Acids (FISH OIL) 1000 MG CAPS Take 2 capsules (2,000 mg total) by mouth 2 (two) times daily.  0   No current facility-administered medications on file prior to visit.     There were no vitals taken for this visit.     Observations/Objective:   Gen: Awake, alert, no acute distress Resp: Breathing is even and non-labored Psych: calm/pleasant demeanor Neuro: Alert and Oriented x 3, + facial symmetry, speech is clear. MS: + swelling and erythema surrounding right elbow.  Assessment and Plan:  Acute gout exacerbation- Hx and presentation is most consistent with acute gout exacerbation.  Will plan to treat with prednisone taper, continue allopurinol. Pt and caregiver are advised to call if increased pain, swelling, redness, fever or if not improved in 2-3 days. They verbalize understanding.   Follow Up Instructions:    I discussed the assessment and treatment plan with the patient. The patient was provided an opportunity to ask questions and all were answered. The patient agreed with the plan and demonstrated an understanding of the instructions.   The patient was advised to call back or seek an in-person evaluation if the symptoms worsen or if the condition fails to improve as anticipated.  [name], NP

## 2019-03-14 ENCOUNTER — Other Ambulatory Visit: Payer: Self-pay

## 2019-03-14 ENCOUNTER — Encounter: Payer: Self-pay | Admitting: Family

## 2019-03-14 ENCOUNTER — Ambulatory Visit (INDEPENDENT_AMBULATORY_CARE_PROVIDER_SITE_OTHER): Payer: Medicare Other | Admitting: Family

## 2019-03-14 VITALS — BP 113/74 | HR 59 | Temp 97.1°F | Resp 16 | Wt 153.0 lb

## 2019-03-14 DIAGNOSIS — E118 Type 2 diabetes mellitus with unspecified complications: Secondary | ICD-10-CM

## 2019-03-14 DIAGNOSIS — E782 Mixed hyperlipidemia: Secondary | ICD-10-CM

## 2019-03-14 DIAGNOSIS — N183 Chronic kidney disease, stage 3 unspecified: Secondary | ICD-10-CM | POA: Diagnosis not present

## 2019-03-14 DIAGNOSIS — I10 Essential (primary) hypertension: Secondary | ICD-10-CM | POA: Diagnosis not present

## 2019-03-14 DIAGNOSIS — M1A9XX0 Chronic gout, unspecified, without tophus (tophi): Secondary | ICD-10-CM

## 2019-03-14 DIAGNOSIS — L853 Xerosis cutis: Secondary | ICD-10-CM | POA: Diagnosis not present

## 2019-03-14 LAB — COMPREHENSIVE METABOLIC PANEL
ALT: 9 U/L (ref 0–53)
AST: 14 U/L (ref 0–37)
Albumin: 3.8 g/dL (ref 3.5–5.2)
Alkaline Phosphatase: 79 U/L (ref 39–117)
BUN: 32 mg/dL — ABNORMAL HIGH (ref 6–23)
CO2: 30 mEq/L (ref 19–32)
Calcium: 9.4 mg/dL (ref 8.4–10.5)
Chloride: 104 mEq/L (ref 96–112)
Creatinine, Ser: 1.35 mg/dL (ref 0.40–1.50)
GFR: 49.96 mL/min — ABNORMAL LOW (ref >60.00)
Glucose, Bld: 92 mg/dL (ref 70–99)
Potassium: 4.2 mEq/L (ref 3.5–5.1)
Sodium: 141 mEq/L (ref 135–145)
Total Bilirubin: 0.8 mg/dL (ref 0.2–1.2)
Total Protein: 7 g/dL (ref 6.0–8.3)

## 2019-03-14 LAB — LIPID PANEL
Cholesterol: 145 mg/dL (ref 0–200)
HDL: 62.7 mg/dL (ref >39.00)
LDL Cholesterol: 70 mg/dL (ref 0–99)
NonHDL: 82.26
Total CHOL/HDL Ratio: 2
Triglycerides: 59 mg/dL (ref 0.0–149.0)
VLDL: 11.8 mg/dL (ref 0.0–40.0)

## 2019-03-14 LAB — HEMOGLOBIN A1C: Hgb A1c MFr Bld: 5.6 % (ref 4.6–6.5)

## 2019-03-14 MED ORDER — BETAMETHASONE VALERATE 0.1 % EX OINT: 1.0000 "application " | TOPICAL_OINTMENT | Freq: Two times a day (BID) | CUTANEOUS | 2 refills | Status: DC | PRN

## 2019-03-14 MED ORDER — LORATADINE 10 MG PO TABS: 10.0000 mg | ORAL_TABLET | Freq: Every day | ORAL | 11 refills | Status: DC

## 2019-03-14 NOTE — Progress Notes (Signed)
Subjective:    Patient ID: Javier [name], male    DOB: 1929-10-20, 85 y.o.   MRN: [id number]  HPI  Patient is an 85 yr old male who presents today for follow up.  DM2- diet controlled.  Lab Results  Component Value Date   HGBA1C 6.0 12/13/2018   HGBA1C 6.0 04/18/2018   HGBA1C 6.2 12/14/2017   Lab Results  Component Value Date   MICROALBUR <0.7 12/25/2014   LDLCALC 41 04/18/2018   CREATININE 2.00 (H) 11/16/2018   Skin rash- reports that he had minimal improvement in his rash with betamethasone cream.  Not using a moisturizer on a regular basis.    Gout- denies any recent gout flare.  Hyperlipidemia- maintained on lovastatin.    HTN- Maintained on amloldipine 10mg .  BP Readings from Last 3 Encounters:  03/14/19 113/74  12/13/18 108/62  11/25/18 102/61   Shoulder pain- reports that he has some chronic right sided shoulder pain.      Review of Systems See HPI  Past Medical History:  Diagnosis Date  . Atrial fibrillation ([place])   . Bronchitis   . CAD (coronary artery disease)   . Chronic renal insufficiency   . Congestive heart failure ([place])   . Diabetes mellitus type II   . GERD (gastroesophageal reflux disease)   . Gout   . Hyperlipidemia   . Hypertension   . Leg cramps   . Osteoarthritis   . Shingles      Social History   Socioeconomic History  . Marital status: Legally Separated    Spouse name: Not on file  . Number of children: Not on file  . Years of education: Not on file  . Highest education level: Not on file  Occupational History  . Not on file  Social Needs  . Financial resource strain: Not on file  . Food insecurity    Worry: Not on file    Inability: Not on file  . Transportation needs    Medical: Not on file    Non-medical: Not on file  Tobacco Use  . Smoking status: Former Research scientist (life sciences)  . Smokeless tobacco: Never Used  . Tobacco comment: quit 40 years ago-40 pack year history  Substance and Sexual Activity  . Alcohol use: Yes   Comment: drinks liquor on occassion.  . Drug use: No  . Sexual activity: Not on file  Lifestyle  . Physical activity    Days per week: Not on file    Minutes per session: Not on file  . Stress: Not on file  Relationships  . Social Herbalist on phone: Not on file    Gets together: Not on file    Attends religious service: Not on file    Active member of club or organization: Not on file    Attends meetings of clubs or organizations: Not on file    Relationship status: Not on file  . Intimate partner violence    Fear of current or ex partner: Not on file    Emotionally abused: Not on file    Physically abused: Not on file    Forced sexual activity: Not on file  Other Topics Concern  . Not on file  Social History Narrative   Last updated: 02/13/2010   Married but separated from wife   Alcohol use-yes   Former Smoker quit 53 yrs ago (59 pack yr history)     Works part time at Insurance account manager [place]    Past Surgical History:  Procedure Laterality Date  . CARDIOVERSION  11/02/2005   s/p  . CORONARY ARTERY BYPASS GRAFT  05/18/2005  . EYE SURGERY     CATARACT SX 06/2015 both eyes Dr.Beavis per pt  . EYE SURGERY Bilateral 12/09/2016   revision of cataract surgery from 2017.  Marland Kitchen. MITRAL VALVE REPAIR  04/2005   s/p mitral valve repair    Family History  Problem Relation Age of Onset  . Diabetes Other        siblings  . Melanoma Brother        died at 3191    No Known Allergies  Current Outpatient Medications on File Prior to Visit  Medication Sig Dispense Refill  . allopurinol (ZYLOPRIM) 100 MG tablet TAKE 1 TABLET BY MOUTH DAILY 90 tablet 1  . amLODipine (NORVASC) 10 MG tablet Take 1 tablet (10 mg total) by mouth daily. 90 tablet 1  . aspirin 81 MG tablet Take 81 mg by mouth daily.      . betamethasone valerate ointment (VALISONE) 0.1 % Apply 1 application topically 2 (two) times daily as needed. For rash 30 g 0  . carvedilol (COREG) 6.25 MG  tablet TAKE 1 TABLET BY MOUTH TWICE DAILY WITH MEALS 180 tablet 1  . colchicine 0.6 MG tablet 2 tabs by mouth at start of gout flare.  Then one tablet 1 hour later. 6 tablet 5  . folic acid (FOLVITE) 1 MG tablet Take 1 tablet (1 mg total) by mouth daily. 30 tablet 5  . furosemide (LASIX) 20 MG tablet Take 1 tablet (20 mg total) by mouth daily. 90 tablet 1  . lisinopril-hydrochlorothiazide (ZESTORETIC) 20-25 MG tablet TAKE 1 TABLET BY MOUTH DAILY 90 tablet 1  . lovastatin (MEVACOR) 40 MG tablet TAKE 1 TABLET BY MOUTH DAILY 90 tablet 1  . Multiple Vitamins-Minerals (MULTIVITAMIN WITH MINERALS) tablet Take 1 tablet by mouth daily.    . Omega-3 Fatty Acids (FISH OIL) 1000 MG CAPS Take 2 capsules (2,000 mg total) by mouth 2 (two) times daily.  0   No current facility-administered medications on file prior to visit.     BP 113/74 (BP Location: Left Arm, Patient Position: Sitting, Cuff Size: Small)   Pulse (!) 59   Temp (!) 97.1 F (36.2 C) (Temporal)   Resp 16   Wt 153 lb (69.4 kg)   SpO2 100%   BMI 23.26 kg/m       Objective:   Physical Exam Constitutional:      General: He is not in acute distress.    Appearance: He is well-developed.  HENT:     Head: Normocephalic and atraumatic.  Cardiovascular:     Rate and Rhythm: Normal rate and regular rhythm.     Heart sounds: No murmur.  Pulmonary:     Effort: Pulmonary effort is normal. No respiratory distress.     Breath sounds: Normal breath sounds. No wheezing or rales.  Musculoskeletal:        General: No swelling.     Comments: Full ROM of right shoulder- some tenderness to palpation  Skin:    General: Skin is warm and dry.     Comments: Very dry skin noted  Neurological:     Mental Status: He is alert and oriented to person, place, and time.  Psychiatric:        Behavior: Behavior normal.        Thought Content: Thought content normal.  Assessment & Plan:  DM2- clinically stable. Obtain a follow up A1C.   Hyperlipidemia- maintained on statin, LDL at goal.  HTN- bp stable on current medication, continue same.  Dry skin- + pruritis.  Offered a referral to dermatology- he declines.  Advised as follows:  You may try sarna anti itch cream as needed- this is available over the counter. Apply a good lotion such as eucerin liberally every day. Apply betamethasone cream as needed for itching. Take claritin 10mg  once daily   Gout- stable on allopurinol, continue same.  CKD- obtain follow up Cr.   Right shoulder pain- offered referral to orthopedics but pt declines. He will let me know if he changes his mind.   Addendum: pt's daughter came and spoke to me privately after today's visit. She is concerned about his memory. Will plan to bring him back for memory testing evaluation.

## 2019-03-14 NOTE — Patient Instructions (Signed)
You may try sarna anti itch cream as needed- this is available over the counter. Apply a good lotion such as eucerin liberally every day. Apply betamethasone cream as needed for itching. Take claritin 10mg  once daily

## 2019-03-15 NOTE — Progress Notes (Signed)
Mailed out to patient 

## 2019-03-28 ENCOUNTER — Ambulatory Visit: Payer: Medicare Other | Admitting: Family

## 2019-04-03 ENCOUNTER — Ambulatory Visit: Payer: Medicare Other | Admitting: Family

## 2019-04-04 ENCOUNTER — Ambulatory Visit: Payer: Medicare Other | Admitting: Family

## 2019-04-05 ENCOUNTER — Ambulatory Visit (INDEPENDENT_AMBULATORY_CARE_PROVIDER_SITE_OTHER): Payer: Medicare Other | Admitting: Family

## 2019-04-05 ENCOUNTER — Other Ambulatory Visit: Payer: Self-pay

## 2019-04-05 ENCOUNTER — Encounter: Payer: Self-pay | Admitting: Family

## 2019-04-05 DIAGNOSIS — R21 Rash and other nonspecific skin eruption: Secondary | ICD-10-CM

## 2019-04-05 DIAGNOSIS — Z8619 Personal history of other infectious and parasitic diseases: Secondary | ICD-10-CM

## 2019-04-05 DIAGNOSIS — Z8616 Personal history of COVID-19: Secondary | ICD-10-CM

## 2019-04-05 NOTE — Progress Notes (Signed)
Virtual Visit via Telephone Note  I connected with Javier [name] on 04/05/19 at 11:40 AM EST by telephone and verified that I am speaking with the correct person using two identifiers.  Location: Patient: home Provider: work   I discussed the limitations, risks, security and privacy concerns of performing an evaluation and management service by telephone and the availability of in person appointments. I also discussed with the patient that there may be a patient responsible charge related to this service. The patient expressed understanding and agreed to proceed.   History of Present Illness:  Patient presents today for follow up.  Reports + rash- still has it.  He reports no significant improvement with topical steroid. Got an otc lotion which is helping, does not know the name.   Reports that he weighs 162 now.  He feels that his appetite is improving following his COVID-19 infection. Wt Readings from Last 3 Encounters:  03/14/19 153 lb (69.4 kg)  12/13/18 154 lb 12.8 oz (70.2 kg)  11/25/18 149 lb 3.2 oz (67.7 kg)   Last visit his daughter indicated concerns about his memory.  The plan today was to try to do a Mini-Mental status exam, however since we were meeting virtually over the phone it is difficult to complete.  I did discuss with the patient and he denies any current issues with memory.  He is currently staying with a friend through the end of the year.  Notes that he is getting stronger and feels like he can safely return to independent living.  He plans to return after the new year.  He reports that he has no problems with his memory.   Observations/Objective:   Gen: Awake, alert Resp: Breathing sounds even and non-labored Psych: calm/pleasant demeanor Neuro: Alert and Oriented x 3, speech sounds is clear.  Assessment and Plan:  Skin rash-I offered to send the patient for a dermatology evaluation, however he declines.  States overall rash is improving.  Recent COVID-19  infection-she continues to improve in strength and weight.  He denies any current issues of memory.  Perhaps his memory had worsened temporarily following his acute infection.  We discussed that if he has further issues or concerns about memory we can plan to do testing at his next in person visit.  Patient verbalizes understanding.  Follow Up Instructions:    I discussed the assessment and treatment plan with the patient. The patient was provided an opportunity to ask questions and all were answered. The patient agreed with the plan and demonstrated an understanding of the instructions.   The patient was advised to call back or seek an in-person evaluation if the symptoms worsen or if the condition fails to improve as anticipated.  I provided 11 minutes of non-face-to-face time during this encounter.   [name], NP

## 2019-04-11 ENCOUNTER — Ambulatory Visit: Payer: Medicare Other | Admitting: Family

## 2019-05-13 ENCOUNTER — Other Ambulatory Visit: Payer: Self-pay | Admitting: Family

## 2019-05-17 DIAGNOSIS — Z23 Encounter for immunization: Secondary | ICD-10-CM | POA: Diagnosis not present

## 2019-06-15 DIAGNOSIS — Z23 Encounter for immunization: Secondary | ICD-10-CM | POA: Diagnosis not present

## 2019-07-08 ENCOUNTER — Other Ambulatory Visit: Payer: Self-pay | Admitting: Family

## 2019-08-11 NOTE — Progress Notes (Signed)
Cardiology Office Note   Date:  08/21/2019   ID:  Javier [name], DOB 05/04/1929, MRN [id number]  PCP:  Javier Alar, NP  Cardiologist: Dr. Johnsie [name]   Chief Complaint  Patient presents with  . Follow-up    History of Present Illness: Javier [name] is a 86 y.o. male who presents for follow-up, seen by Dr. Johnsie [name].   Javier [name] has a history of CAD status post CABG x2 and MVR in 2006 with LIMA to LAD and SVG to D2.  Also with a history of PAF on amiodarone initially diagnosed in 2007 status post DCCV x1 who was found to be back in atrial fibrillation by 2011 at which time he was started on Coumadin therapy with successful cardioversion.  He was seen by Dr. Johnsie [name] in 2015 and was no longer on Coumadin.  History of CKD stage III.  He was last seen in follow-up 08/03/2016 by Javier Husk PA and was doing well from a CV standpoint.  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   Today he reports that he has been doing well from a CV standpoint without complaints of chest pain, SOB, palpitations, dizziness, LE edema, orthopnea or syncope. He is here with his son.  Reports he had Covid last summer and lost approximately 100 pounds due to poor appetite secondary to loss of taste.  Given this, his BP is on the lower side of normal at 108/64 and is currently on three antihypertensive medications.  We discussed reducing some of these down and also pursuing a stress test given the duration of time since his surgery.  He is in agreement.  He has plans to follow with his PCP in the next couple weeks for routine follow-up for issues of dry skin and ear drainage for which there appears to be no active signs of infection including redness or swelling. I have recommended to continue with coconut oil for his skin. May need that ENT referral.   Past Medical History:  Diagnosis Date  . Atrial fibrillation ([place])   . Bronchitis   . CAD (coronary  artery disease)   . Chronic renal insufficiency   . Congestive heart failure ([place])   . Diabetes mellitus type II   . GERD (gastroesophageal reflux disease)   . Gout   . Hyperlipidemia   . Hypertension   . Leg cramps   . Osteoarthritis   . Shingles     Past Surgical History:  Procedure Laterality Date  . CARDIOVERSION  11/02/2005   s/p  . CORONARY ARTERY BYPASS GRAFT  05/18/2005  . EYE SURGERY     CATARACT SX 06/2015 both eyes Dr.Beavis per pt  . EYE SURGERY Bilateral 12/09/2016   revision of cataract surgery from 2017.  Marland Kitchen MITRAL VALVE REPAIR  04/2005   s/p mitral valve repair     Current Outpatient Medications  Medication Sig Dispense Refill  . allopurinol (ZYLOPRIM) 100 MG tablet TAKE 1 TABLET BY MOUTH DAILY 90 tablet 1  . amLODipine (NORVASC) 10 MG tablet TAKE 1 TABLET BY MOUTH DAILY 90 tablet 0  . aspirin 81 MG tablet Take 81 mg by mouth daily.      . betamethasone valerate ointment (VALISONE) 0.1 % Apply 1 application topically 2 (two) times daily as needed. For rash 30 g 2  . carvedilol (COREG) 6.25 MG tablet TAKE 1 TABLET BY MOUTH 2 TIMES DAILY WITH MEALS 180  tablet 1  . colchicine 0.6 MG tablet 2 tabs by mouth at start of gout flare.  Then one tablet 1 hour later. 6 tablet 5  . folic acid (FOLVITE) 1 MG tablet Take 1 tablet (1 mg total) by mouth daily. 30 tablet 5  . furosemide (LASIX) 20 MG tablet TAKE 1 TABLET BY MOUTH DAILY 90 tablet 0  . lisinopril-hydrochlorothiazide (ZESTORETIC) 20-25 MG tablet TAKE 1 TABLET BY MOUTH DAILY 90 tablet 1  . loratadine (CLARITIN) 10 MG tablet Take 1 tablet (10 mg total) by mouth daily. 30 tablet 11  . lovastatin (MEVACOR) 40 MG tablet TAKE 1 TABLET BY MOUTH DAILY 90 tablet 1  . Multiple Vitamins-Minerals (MULTIVITAMIN WITH MINERALS) tablet Take 1 tablet by mouth daily.    . Omega-3 Fatty Acids (FISH OIL) 1000 MG CAPS Take 2 capsules (2,000 mg total) by mouth 2 (two) times daily.  0   No current facility-administered medications for  this visit.    Allergies:   Patient has no known allergies.    Social History:  The patient  reports that he has quit smoking. He has never used smokeless tobacco. He reports current alcohol use. He reports that he does not use drugs.   Family History:  The patient's family history includes Diabetes in an other family member; Melanoma in his brother.   ROS:  Please see the history of present illness.  Otherwise, review of systems are positive for none.  All other systems are reviewed and negative.    PHYSICAL EXAM: VS:  There were no vitals taken for this visit. , BMI There is no height or weight on file to calculate BMI.   General: Well developed, well nourished, NAD Neck: Negative for carotid bruits. No JVD Lungs:Clear to ausculation bilaterally.  Cardiovascular: RRR with S1 S2. No murmurs Extremities: No edema.  Radial pulses 2+ bilaterally Neuro: Alert and oriented. No focal deficits. No facial asymmetry. MAE spontaneously. Psych: Responds to questions appropriately with normal affect.     EKG:  EKG is ordered today. The ekg ordered today demonstrates NSR with 1st degree AV block    Recent Labs: 11/16/2018: Hemoglobin 11.8; Platelets 152.0; TSH 0.93 03/14/2019: ALT 9; BUN 32; Creatinine, Ser 1.35; Potassium 4.2; Sodium 141   Lipid Panel    Component Value Date/Time   CHOL 145 03/14/2019 1059   TRIG 59.0 03/14/2019 1059   HDL 62.70 03/14/2019 1059   CHOLHDL 2 03/14/2019 1059   VLDL 11.8 03/14/2019 1059   LDLCALC 70 03/14/2019 1059   LDLDIRECT 53.0 12/25/2014 0831      Wt Readings from Last 3 Encounters:  03/14/19 153 lb (69.4 kg)  12/13/18 154 lb 12.8 oz (70.2 kg)  11/25/18 149 lb 3.2 oz (67.7 kg)    Other studies Reviewed: Additional studies/ records that were reviewed today include:   Echocardiogram 01/01/2014:  - Left ventricle: The cavity size was normal. Wall thickness was  increased in a pattern of mild LVH. Systolic function was normal.  The  estimated ejection fraction was in the range of 60% to 65%.  Doppler parameters are consistent with elevated mean left atrial  filling pressure.  - Left atrium: The atrium was moderately dilated   ASSESSMENT AND PLAN  1.  CAD: -S/p remote CABG with MVR with LIMA to LAD and SVG to D2 in 2006 -Proceed with follow up CV surveillance at this time given the duration since last coronary evaluation -Denies anginal symptoms -Continue current regimen   2.  Paroxysmal atrial  fibrillation: -s/p DCCV x2 last episode in 2011 -Noted to be in normal sinus rhythm on last OV -Not on anticoagulation -Denies palpitations  -EKG with NSR and first-degree AV block  3.  Essential hypertension: -Low, 108/64>> likely in the setting of weight loss -Reduce amlodipine to 5, reduce carvedilol to 3.125  4.  Hyperlipidemia: -Unwilling to make dietary changes -Continue Mevacor?  5.  CKD stage III: -Most recent creatinine, 1.35 on 03/14/2019   Current medicines are reviewed at length with the patient today.  The patient does not have concerns regarding medicines.  The following changes have been made:  Reduce amlodipine to 5mg  PO QD, reduce carvedilol to 3.125mg  PO BID, add Kdur PO QD  Labs/ tests ordered today include: BMET No orders of the defined types were placed in this encounter.   Disposition:   FU with Dr. in 1 year  Signed, Eden Emms, NP  08/21/2019 10:11 AM    [facility] [address] [place], [place], [place]  [place] Phone: [phone]; Fax: [phone]

## 2019-08-21 ENCOUNTER — Ambulatory Visit (INDEPENDENT_AMBULATORY_CARE_PROVIDER_SITE_OTHER): Payer: Medicare Other | Admitting: Cardiology

## 2019-08-21 ENCOUNTER — Encounter: Payer: Self-pay | Admitting: Cardiology

## 2019-08-21 ENCOUNTER — Other Ambulatory Visit: Payer: Self-pay

## 2019-08-21 VITALS — BP 108/64 | HR 64 | Ht 67.5 in | Wt 156.8 lb

## 2019-08-21 DIAGNOSIS — I480 Paroxysmal atrial fibrillation: Secondary | ICD-10-CM | POA: Diagnosis not present

## 2019-08-21 DIAGNOSIS — I2510 Atherosclerotic heart disease of native coronary artery without angina pectoris: Secondary | ICD-10-CM | POA: Diagnosis not present

## 2019-08-21 DIAGNOSIS — I10 Essential (primary) hypertension: Secondary | ICD-10-CM | POA: Diagnosis not present

## 2019-08-21 DIAGNOSIS — Z951 Presence of aortocoronary bypass graft: Secondary | ICD-10-CM | POA: Diagnosis not present

## 2019-08-21 DIAGNOSIS — I5032 Chronic diastolic (congestive) heart failure: Secondary | ICD-10-CM | POA: Diagnosis not present

## 2019-08-21 DIAGNOSIS — E782 Mixed hyperlipidemia: Secondary | ICD-10-CM

## 2019-08-21 LAB — BASIC METABOLIC PANEL
BUN/Creatinine Ratio: 29 — ABNORMAL HIGH (ref 10–24)
BUN: 51 mg/dL — ABNORMAL HIGH (ref 8–27)
CO2: 22 mmol/L (ref 20–29)
Calcium: 9.5 mg/dL (ref 8.6–10.2)
Chloride: 105 mmol/L (ref 96–106)
Creatinine, Ser: 1.78 mg/dL — ABNORMAL HIGH (ref 0.76–1.27)
GFR calc Af Amer: 39 mL/min/{1.73_m2} — ABNORMAL LOW (ref >59)
GFR calc non Af Amer: 34 mL/min/{1.73_m2} — ABNORMAL LOW (ref >59)
Glucose: 96 mg/dL (ref 65–99)
Potassium: 4.2 mmol/L (ref 3.5–5.2)
Sodium: 140 mmol/L (ref 134–144)

## 2019-08-21 MED ORDER — CARVEDILOL 3.125 MG PO TABS: 3.1250 mg | ORAL_TABLET | Freq: Two times a day (BID) | ORAL | 3 refills | Status: DC

## 2019-08-21 MED ORDER — AMLODIPINE BESYLATE 5 MG PO TABS: 5.0000 mg | ORAL_TABLET | Freq: Every day | ORAL | 3 refills | Status: DC

## 2019-08-21 MED ORDER — POTASSIUM CHLORIDE ER 10 MEQ PO TBCR: 10.0000 meq | EXTENDED_RELEASE_TABLET | Freq: Every day | ORAL | 3 refills | Status: DC

## 2019-08-21 NOTE — Patient Instructions (Signed)
Medication Instructions:   Your physician has recommended you make the following change in your medication:   1) Decrease Amlodipine to 5 mg, 1 tablet by mouth once a day 2) Decrease Carvedilol to 3.125 mg, 1 tablet by mouth twice a day 3) Start Potassium 10 mEq, 1 tablet by mouth once a day  *If you need a refill on your cardiac medications before your next appointment, please call your pharmacy*  Lab Work:  You will have labs drawn today: BMET  Testing/Procedures:  Your physician has requested that you have a lexiscan myoview. For further information please visit [email or web address] Please follow instruction sheet, as given.  Follow-Up: At [facility], you and your health needs are our priority.  As part of our continuing mission to provide you with exceptional heart care, we have created designated Provider Care Teams.  These Care Teams include your primary Cardiologist (physician) and Advanced Practice Providers (APPs -  Physician Assistants and Nurse Practitioners) who all work together to provide you with the care you need, when you need it.  We recommend signing up for the patient portal called "MyChart".  Sign up information is provided on this After Visit Summary.  MyChart is used to connect with patients for Virtual Visits (Telemedicine).  Patients are able to view lab/test results, encounter notes, upcoming appointments, etc.  Non-urgent messages can be sent to your provider as well.   To learn more about what you can do with MyChart, go to [email or web address].    Your next appointment:   12 month(s)  The format for your next appointment:   In Person  Provider:   Charlton Haws, MD

## 2019-08-23 ENCOUNTER — Telehealth (HOSPITAL_COMMUNITY): Payer: Self-pay | Admitting: *Deleted

## 2019-08-23 NOTE — Telephone Encounter (Signed)
Attempted to call patient no answer, unable to leave a message.  Javier [name]  

## 2019-08-28 ENCOUNTER — Ambulatory Visit (HOSPITAL_COMMUNITY): Payer: Medicare Other | Attending: Cardiovascular Disease

## 2019-08-28 ENCOUNTER — Other Ambulatory Visit: Payer: Self-pay

## 2019-08-28 DIAGNOSIS — Z951 Presence of aortocoronary bypass graft: Secondary | ICD-10-CM | POA: Diagnosis not present

## 2019-08-28 DIAGNOSIS — I2510 Atherosclerotic heart disease of native coronary artery without angina pectoris: Secondary | ICD-10-CM

## 2019-08-28 LAB — MYOCARDIAL PERFUSION IMAGING
LV dias vol: 117 mL (ref 62–150)
LV sys vol: 57 mL
Peak HR: 61 {beats}/min
Rest HR: 52 {beats}/min
SDS: 1
SRS: 0
SSS: 1
TID: 1.07

## 2019-08-28 MED ORDER — TECHNETIUM TC 99M TETROFOSMIN IV KIT
31.3000 | PACK | Freq: Once | INTRAVENOUS | Status: AC | PRN
  Administered 2019-08-28: 31.3 via INTRAVENOUS
  Filled 2019-08-28: qty 32

## 2019-08-28 MED ORDER — TECHNETIUM TC 99M TETROFOSMIN IV KIT
10.1000 | PACK | Freq: Once | INTRAVENOUS | Status: AC | PRN
  Administered 2019-08-28: 10.1 via INTRAVENOUS
  Filled 2019-08-28: qty 11

## 2019-08-28 MED ORDER — REGADENOSON 0.4 MG/5ML IV SOLN
0.4000 mg | Freq: Once | INTRAVENOUS | Status: AC
  Administered 2019-08-28: 0.4 mg via INTRAVENOUS

## 2019-10-06 ENCOUNTER — Emergency Department (HOSPITAL_BASED_OUTPATIENT_CLINIC_OR_DEPARTMENT_OTHER)
Admission: EM | Admit: 2019-10-06 | Discharge: 2019-10-06 | Disposition: A | Discharge status: Home / Self Care | Payer: Medicare Other | Attending: Emergency Medicine | Admitting: Emergency Medicine

## 2019-10-06 ENCOUNTER — Telehealth: Payer: Self-pay

## 2019-10-06 ENCOUNTER — Other Ambulatory Visit: Payer: Self-pay

## 2019-10-06 ENCOUNTER — Encounter (HOSPITAL_BASED_OUTPATIENT_CLINIC_OR_DEPARTMENT_OTHER): Payer: Self-pay | Admitting: *Deleted

## 2019-10-06 ENCOUNTER — Emergency Department (HOSPITAL_BASED_OUTPATIENT_CLINIC_OR_DEPARTMENT_OTHER): Payer: Medicare Other

## 2019-10-06 DIAGNOSIS — E119 Type 2 diabetes mellitus without complications: Secondary | ICD-10-CM | POA: Diagnosis not present

## 2019-10-06 DIAGNOSIS — M545 Low back pain: Secondary | ICD-10-CM | POA: Insufficient documentation

## 2019-10-06 DIAGNOSIS — Z79899 Other long term (current) drug therapy: Secondary | ICD-10-CM | POA: Diagnosis not present

## 2019-10-06 DIAGNOSIS — I110 Hypertensive heart disease with heart failure: Secondary | ICD-10-CM | POA: Diagnosis not present

## 2019-10-06 DIAGNOSIS — S32000A Wedge compression fracture of unspecified lumbar vertebra, initial encounter for closed fracture: Secondary | ICD-10-CM

## 2019-10-06 DIAGNOSIS — I2510 Atherosclerotic heart disease of native coronary artery without angina pectoris: Secondary | ICD-10-CM | POA: Insufficient documentation

## 2019-10-06 DIAGNOSIS — W19XXXA Unspecified fall, initial encounter: Secondary | ICD-10-CM

## 2019-10-06 DIAGNOSIS — I509 Heart failure, unspecified: Secondary | ICD-10-CM | POA: Diagnosis not present

## 2019-10-06 DIAGNOSIS — Z7982 Long term (current) use of aspirin: Secondary | ICD-10-CM | POA: Diagnosis not present

## 2019-10-06 MED ORDER — TRAMADOL HCL 50 MG PO TABS: 50.0000 mg | ORAL_TABLET | Freq: Four times a day (QID) | ORAL | 0 refills | Status: DC | PRN
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
## 2019-10-06 NOTE — Telephone Encounter (Signed)
Pt in ED.  
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
## 2019-10-06 NOTE — ED Provider Notes (Signed)
[facility] EMERGENCY DEPARTMENT Provider Note   CSN: [id number] Arrival date & time: 10/06/19  3419     History Chief Complaint  Patient presents with  . Back Pain    Javier [name] is a 86 y.o. male.  Patient on Tuesday was getting out of his car he stepped in a ditch and fell landing on his buttocks.  Complaint of low back pain since then.  Patient points to more of the lower part of the lumbar area.  Denies any hip pain no numbness or weakness to his legs.  No neck pain did not hit his head no loss of consciousness.  Denies any other injuries.        Past Medical History:  Diagnosis Date  . Atrial fibrillation ([facility])   . Bronchitis   . CAD (coronary artery disease)   . Chronic renal insufficiency   . Congestive heart failure ([facility])   . Diabetes mellitus type II   . GERD (gastroesophageal reflux disease)   . Gout   . Hyperlipidemia   . Hypertension   . Leg cramps   . Osteoarthritis   . Shingles     Patient Active Problem List   Diagnosis Date Noted  . Obesity 11/22/2013  . Decreased pedal pulses 08/23/2013  . Undiagnosed cardiac murmurs 02/06/2013  . Skin lesion 05/18/2011  . Mitral valve disorder 06/30/2010  . SLEEP APNEA, OBSTRUCTIVE 01/03/2010  . MITRAL VALVE REPLACEMENT, HX OF 08/29/2009  . FIBRILLATION, ATRIAL 08/20/2009  . HYPERLIPIDEMIA 05/03/2007  . Diabetes mellitus type 2, controlled, with complications (H[facility]) 03/23/2007  . Gout 03/23/2007  . Essential hypertension 03/23/2007  . Coronary atherosclerosis 03/23/2007  . Chronic diastolic CHF (congestive heart failure) ([facility]) 03/23/2007  . GERD 03/23/2007  . Chronic kidney disease 03/23/2007  . OSTEOARTHRITIS 03/23/2007    Past Surgical History:  Procedure Laterality Date  . CARDIOVERSION  11/02/2005   s/p  . CORONARY ARTERY BYPASS GRAFT  05/18/2005  . EYE SURGERY     CATARACT SX 06/2015 both eyes Dr.Beavis per pt  . EYE SURGERY Bilateral 12/09/2016   revision of cataract surgery from  2017.  Marland Kitchen MITRAL VALVE REPAIR  04/2005   s/p mitral valve repair       Family History  Problem Relation Age of Onset  . Diabetes Other        siblings  . Melanoma Brother        died at 24    Social History   Tobacco Use  . Smoking status: Former Games developer  . Smokeless tobacco: Never Used  . Tobacco comment: quit 40 years ago-40 pack year history  Vaping Use  . Vaping Use: Never used  Substance Use Topics  . Alcohol use: Yes    Comment: drinks liquor on occassion.  . Drug use: No    Home Medications Prior to Admission medications   Medication Sig Start Date End Date Taking? Authorizing Provider  allopurinol (ZYLOPRIM) 100 MG tablet TAKE 1 TABLET BY MOUTH DAILY 07/10/19   Sandford Craze, NP  amLODipine (NORVASC) 5 MG tablet Take 1 tablet (5 mg total) by mouth daily. 08/21/19   Georgie Chard D, NP  aspirin 81 MG tablet Take 81 mg by mouth daily.      [provider]  betamethasone valerate ointment (VALISONE) 0.1 % Apply 1 application topically 2 (two) times daily as needed. For rash 03/14/19   Sandford Craze, NP  carvedilol (COREG) 3.125 MG tablet Take 1 tablet (3.125 mg total) by mouth  2 (two) times daily with a meal. TAKE 1 TABLET BY MOUTH 2 TIMES DAILY WITH MEALS 08/21/19   Georgie Chard D, NP  colchicine 0.6 MG tablet 2 tabs by mouth at start of gout flare.  Then one tablet 1 hour later. 07/18/18   Sandford Craze, NP  folic acid (FOLVITE) 1 MG tablet Take 1 tablet (1 mg total) by mouth daily. 08/04/16   Sandford Craze, NP  furosemide (LASIX) 20 MG tablet TAKE 1 TABLET BY MOUTH DAILY 07/10/19   Sandford Craze, NP  lisinopril-hydrochlorothiazide (ZESTORETIC) 20-25 MG tablet TAKE 1 TABLET BY MOUTH DAILY 07/10/19   Sandford Craze, NP  loratadine (CLARITIN) 10 MG tablet Take 1 tablet (10 mg total) by mouth daily. 03/14/19   Sandford Craze, NP  lovastatin (MEVACOR) 40 MG tablet TAKE 1 TABLET BY MOUTH DAILY 07/10/19   Sandford Craze, NP    Multiple Vitamins-Minerals (MULTIVITAMIN WITH MINERALS) tablet Take 1 tablet by mouth daily. 11/17/18   Sandford Craze, NP  Omega-3 Fatty Acids (FISH OIL) 1000 MG CAPS Take 2 capsules (2,000 mg total) by mouth 2 (two) times daily. 05/13/11   Sandford Craze, NP  potassium chloride (KLOR-CON) 10 MEQ tablet Take 1 tablet (10 mEq total) by mouth daily. 08/21/19   Filbert Schilder, NP    Allergies    Patient has no known allergies.  Review of Systems   Review of Systems  Constitutional: Negative for chills and fever.  HENT: Negative for congestion, rhinorrhea and sore throat.   Eyes: Negative for visual disturbance.  Respiratory: Negative for cough and shortness of breath.   Cardiovascular: Negative for chest pain and leg swelling.  Gastrointestinal: Negative for abdominal pain, diarrhea, nausea and vomiting.  Genitourinary: Negative for dysuria.  Musculoskeletal: Positive for back pain. Negative for neck pain.  Skin: Negative for rash.  Neurological: Negative for dizziness, light-headedness and headaches.  Hematological: Does not bruise/bleed easily.  Psychiatric/Behavioral: Negative for confusion.    Physical Exam Updated Vital Signs BP 99/64 (BP Location: Right Arm)   Pulse 75   Temp 97.9 F (36.6 C) (Oral)   Resp 20   Ht 1.702 m (5\' 7" )   Wt 72.6 kg   SpO2 99%   BMI 25.06 kg/m   Physical Exam Vitals and nursing note reviewed.  Constitutional:      Appearance: Normal appearance. He is well-developed.  HENT:     Head: Normocephalic and atraumatic.  Eyes:     Conjunctiva/sclera: Conjunctivae normal.     Pupils: Pupils are equal, round, and reactive to light.  Neck:     Comments: No posterior cervical spine tenderness to palpation. Cardiovascular:     Rate and Rhythm: Normal rate and regular rhythm.     Heart sounds: No murmur heard.   Pulmonary:     Effort: Pulmonary effort is normal. No respiratory distress.     Breath sounds: Normal breath sounds.   Abdominal:     Palpations: Abdomen is soft.     Tenderness: There is no abdominal tenderness.  Musculoskeletal:        General: Tenderness present.     Cervical back: Neck supple. No rigidity or tenderness.     Comments: Point tenderness mostly in the lower part of the lumbar area.  Some in the upper lumbar.  Skin:    General: Skin is warm and dry.     Capillary Refill: Capillary refill takes less than 2 seconds.  Neurological:     General: No focal deficit present.  Mental Status: He is alert and oriented to person, place, and time.     Cranial Nerves: No cranial nerve deficit.     Sensory: No sensory deficit.     Motor: No weakness.     ED Results / Procedures / Treatments   Labs (all labs ordered are listed, but only abnormal results are displayed) Labs Reviewed - No data to display  EKG None  Radiology No results found.  Procedures Procedures (including critical care time)  Medications Ordered in ED Medications - No data to display  ED Course  I have reviewed the triage vital signs and the nursing notes.  Pertinent labs & imaging results that were available during my care of the patient were reviewed by me and considered in my medical decision making (see chart for details).    MDM Rules/Calculators/A&P                          X-rays of the lumbar back shows evidence of a L1 compression fracture.  They were unable to determine age.  So CT scan lumbar area was done.  Which confirms this is more of an acute fracture.  They state on the CT scan there is about a 30% loss.   We will treat patient symptomatically with tramadol.  Patient without any neurological deficits.  No evidence of any other injuries of his [place] nature from the fall.  Will refer patient to orthopedics.    Final Clinical Impression(s) / ED Diagnoses Final diagnoses:  None    Rx / DC Orders ED Discharge Orders    None       Fredia Sorrow, MD 10/06/19 1217

## 2019-10-06 NOTE — Discharge Instructions (Addendum)
CAT scan confirms a new first lumbar vertebrae compression fracture.  Make an appointment to follow-up with orthopedics.  Information provided above.  Follow-up with your primary care doctor as needed.  Take the tramadol as needed for pain.  Expect this to be painful for a couple months.  Return for any new or worse symptoms.

## 2019-10-11 ENCOUNTER — Encounter: Payer: Self-pay | Admitting: Family Medicine

## 2019-10-11 ENCOUNTER — Other Ambulatory Visit: Payer: Self-pay

## 2019-10-11 ENCOUNTER — Ambulatory Visit (INDEPENDENT_AMBULATORY_CARE_PROVIDER_SITE_OTHER): Payer: Medicare Other | Admitting: Family Medicine

## 2019-10-11 VITALS — BP 108/68 | HR 68 | Temp 95.7°F | Wt 158.0 lb

## 2019-10-11 DIAGNOSIS — M2569 Stiffness of other specified joint, not elsewhere classified: Secondary | ICD-10-CM | POA: Diagnosis not present

## 2019-10-11 DIAGNOSIS — R350 Frequency of micturition: Secondary | ICD-10-CM

## 2019-10-11 LAB — POCT URINALYSIS DIPSTICK
Bilirubin, UA: NEGATIVE
Blood, UA: NEGATIVE
Glucose, UA: NEGATIVE
Ketones, UA: NEGATIVE
Leukocytes, UA: NEGATIVE
Nitrite, UA: NEGATIVE
Protein, UA: NEGATIVE
Spec Grav, UA: 1.02 (ref 1.010–1.025)
Urobilinogen, UA: 0.2 E.U./dL
pH, UA: 5 (ref 5.0–8.0)

## 2019-10-11 MED ORDER — SULFAMETHOXAZOLE-TRIMETHOPRIM 800-160 MG PO TABS: 1.0000 | ORAL_TABLET | Freq: Two times a day (BID) | ORAL | 0 refills | Status: AC

## 2019-10-11 NOTE — Patient Instructions (Addendum)
Stay hydrated.   Warning signs/symptoms: Uncontrollable nausea/vomiting, fevers, worsening symptoms despite treatment, confusion.  Give [place] around 2 business days to get culture back to you.  Someone will reach out soon regarding the home PT.  Heat (pad or rice pillow in microwave) over affected area, 10-15 minutes twice daily.   OK to take Tylenol 1000 mg (2 extra strength tabs) or 975 mg (3 regular strength tabs) every 6 hours as needed.  Let us know if you need anything.

## 2019-10-11 NOTE — Progress Notes (Signed)
Chief Complaint  Patient presents with  . Back Pain  . urine has an odor    Javier [name] is a 86 y.o. male here for possible UTI.  He is here with his friend her husband has a history  Duration: 4 days. Symptoms: Odor, confusion, urinary frequency and urinary retention Denies: hematuria, fever, nausea, vomiting and discharge Hx of recurrent UTI? No He is not circumcised  Patient fell last week.  He went to the emergency department and was diagnosed with a contusion.  That pain is getting better though he has not been very active.  He is noticing increased stiffness in the muscles of his back.  He has not been doing any stretches or exercises.  Past Medical History:  Diagnosis Date  . Atrial fibrillation (HCC)   . Bronchitis   . CAD (coronary artery disease)   . Chronic renal insufficiency   . Congestive heart failure (H[facility])   . Diabetes mellitus type II   . GERD (gastroesophageal reflux disease)   . Gout   . Hyperlipidemia   . Hypertension   . Leg cramps   . Osteoarthritis   . Shingles      BP 108/68 (BP Location: Left Arm, Patient Position: Sitting, Cuff Size: Normal)   Pulse 68   Temp (!) 95.7 F (35.4 C) (Temporal)   Wt 158 lb (71.7 kg)   SpO2 96%   BMI 24.75 kg/m  General: Awake, alert, appears stated age Heart: RRR Lungs: CTAB, normal respiratory effort, no accessory muscle usage Abd: BS+, soft, NT, ND, no masses or organomegaly MSK: No CVA tenderness, neg Lloyd's sign; positive midline lower thoracic tenderness without crepitus or fluctuance; there is moderate tightness over the lower thoracic paraspinal musculature Psych: Limited judgment and insight  Urinary frequency - Plan: sulfamethoxazole-trimethoprim (BACTRIM DS) 800-160 MG tablet, POCT Urinalysis Dipstick, Urine Culture  Back stiffness - Plan: Ambulatory referral to Home Health  We will treat empirically for a UTI given the confusion.  Check culture.  Urinalysis unremarkable.  Stay hydrated. Seek  immediate care if pt starts to develop fevers, new/worsening symptoms, uncontrollable N/V. For his lower back stiffness, I think the contusion is getting better but due to his lack of mobility he is having increased stiffness.  We will set him up with home physical therapy as neither he nor his friend believe he will be compliant with home stretches and exercises and due to balance issues, it is not safe for outpatient physical therapy. F/u prn. The patient and his friend voiced understanding and agreement to the plan.  Jilda Roche Troy, DO 10/11/19 4:37 PM

## 2019-10-12 LAB — URINE CULTURE
MICRO NUMBER:: 10625178
SPECIMEN QUALITY:: ADEQUATE

## 2019-10-18 DIAGNOSIS — I4891 Unspecified atrial fibrillation: Secondary | ICD-10-CM | POA: Diagnosis not present

## 2019-10-18 DIAGNOSIS — I2510 Atherosclerotic heart disease of native coronary artery without angina pectoris: Secondary | ICD-10-CM | POA: Diagnosis not present

## 2019-10-18 DIAGNOSIS — R350 Frequency of micturition: Secondary | ICD-10-CM | POA: Diagnosis not present

## 2019-10-18 DIAGNOSIS — I130 Hypertensive heart and chronic kidney disease with heart failure and stage 1 through stage 4 chronic kidney disease, or unspecified chronic kidney disease: Secondary | ICD-10-CM | POA: Diagnosis not present

## 2019-10-18 DIAGNOSIS — Z7982 Long term (current) use of aspirin: Secondary | ICD-10-CM | POA: Diagnosis not present

## 2019-10-18 DIAGNOSIS — Z9181 History of falling: Secondary | ICD-10-CM | POA: Diagnosis not present

## 2019-10-18 DIAGNOSIS — H548 Legal blindness, as defined in USA: Secondary | ICD-10-CM | POA: Diagnosis not present

## 2019-10-18 DIAGNOSIS — I509 Heart failure, unspecified: Secondary | ICD-10-CM | POA: Diagnosis not present

## 2019-10-18 DIAGNOSIS — M47816 Spondylosis without myelopathy or radiculopathy, lumbar region: Secondary | ICD-10-CM | POA: Diagnosis not present

## 2019-10-18 DIAGNOSIS — E785 Hyperlipidemia, unspecified: Secondary | ICD-10-CM | POA: Diagnosis not present

## 2019-10-18 DIAGNOSIS — K219 Gastro-esophageal reflux disease without esophagitis: Secondary | ICD-10-CM | POA: Diagnosis not present

## 2019-10-18 DIAGNOSIS — F0390 Unspecified dementia without behavioral disturbance: Secondary | ICD-10-CM | POA: Diagnosis not present

## 2019-10-18 DIAGNOSIS — N189 Chronic kidney disease, unspecified: Secondary | ICD-10-CM | POA: Diagnosis not present

## 2019-10-18 DIAGNOSIS — M1030 Gout due to renal impairment, unspecified site: Secondary | ICD-10-CM | POA: Diagnosis not present

## 2019-10-18 DIAGNOSIS — Z85118 Personal history of other malignant neoplasm of bronchus and lung: Secondary | ICD-10-CM | POA: Diagnosis not present

## 2019-10-18 DIAGNOSIS — Z87440 Personal history of urinary (tract) infections: Secondary | ICD-10-CM | POA: Diagnosis not present

## 2019-10-18 DIAGNOSIS — J9690 Respiratory failure, unspecified, unspecified whether with hypoxia or hypercapnia: Secondary | ICD-10-CM | POA: Diagnosis not present

## 2019-10-18 DIAGNOSIS — E1122 Type 2 diabetes mellitus with diabetic chronic kidney disease: Secondary | ICD-10-CM | POA: Diagnosis not present

## 2019-10-24 ENCOUNTER — Emergency Department (HOSPITAL_COMMUNITY)
Admission: EM | Admit: 2019-10-24 | Discharge: 2019-10-25 | Disposition: A | Discharge status: Home / Self Care | Payer: Medicare Other | Attending: Emergency Medicine | Admitting: Emergency Medicine

## 2019-10-24 ENCOUNTER — Telehealth: Payer: Self-pay | Admitting: Internal Medicine

## 2019-10-24 ENCOUNTER — Encounter (HOSPITAL_COMMUNITY): Payer: Self-pay

## 2019-10-24 ENCOUNTER — Telehealth: Payer: Self-pay

## 2019-10-24 DIAGNOSIS — I2510 Atherosclerotic heart disease of native coronary artery without angina pectoris: Secondary | ICD-10-CM | POA: Diagnosis not present

## 2019-10-24 DIAGNOSIS — M47816 Spondylosis without myelopathy or radiculopathy, lumbar region: Secondary | ICD-10-CM | POA: Diagnosis not present

## 2019-10-24 DIAGNOSIS — I959 Hypotension, unspecified: Secondary | ICD-10-CM | POA: Insufficient documentation

## 2019-10-24 DIAGNOSIS — E1122 Type 2 diabetes mellitus with diabetic chronic kidney disease: Secondary | ICD-10-CM | POA: Diagnosis not present

## 2019-10-24 DIAGNOSIS — I130 Hypertensive heart and chronic kidney disease with heart failure and stage 1 through stage 4 chronic kidney disease, or unspecified chronic kidney disease: Secondary | ICD-10-CM | POA: Diagnosis not present

## 2019-10-24 DIAGNOSIS — Z5321 Procedure and treatment not carried out due to patient leaving prior to being seen by health care provider: Secondary | ICD-10-CM | POA: Insufficient documentation

## 2019-10-24 DIAGNOSIS — F0390 Unspecified dementia without behavioral disturbance: Secondary | ICD-10-CM | POA: Diagnosis not present

## 2019-10-24 DIAGNOSIS — I509 Heart failure, unspecified: Secondary | ICD-10-CM | POA: Diagnosis not present

## 2019-10-24 LAB — CBC
HCT: 31.2 % — ABNORMAL LOW (ref 39.0–52.0)
Hemoglobin: 10.1 g/dL — ABNORMAL LOW (ref 13.0–17.0)
MCH: 33.8 pg (ref 26.0–34.0)
MCHC: 32.4 g/dL (ref 30.0–36.0)
MCV: 104.3 fL — ABNORMAL HIGH (ref 80.0–100.0)
Platelets: 121 10*3/uL — ABNORMAL LOW (ref 150–400)
RBC: 2.99 MIL/uL — ABNORMAL LOW (ref 4.22–5.81)
RDW: 13.1 % (ref 11.5–15.5)
WBC: 6 10*3/uL (ref 4.0–10.5)
nRBC: 0 % (ref 0.0–0.2)

## 2019-10-24 LAB — URINALYSIS, ROUTINE W REFLEX MICROSCOPIC
Bilirubin Urine: NEGATIVE
Glucose, UA: NEGATIVE mg/dL
Hgb urine dipstick: NEGATIVE
Ketones, ur: NEGATIVE mg/dL
Leukocytes,Ua: NEGATIVE
Nitrite: NEGATIVE
Protein, ur: NEGATIVE mg/dL
Specific Gravity, Urine: 1.012 (ref 1.005–1.030)
pH: 5 (ref 5.0–8.0)

## 2019-10-24 LAB — BASIC METABOLIC PANEL
Anion gap: 10 (ref 5–15)
BUN: 68 mg/dL — ABNORMAL HIGH (ref 8–23)
CO2: 20 mmol/L — ABNORMAL LOW (ref 22–32)
Calcium: 9.3 mg/dL (ref 8.9–10.3)
Chloride: 107 mmol/L (ref 98–111)
Creatinine, Ser: 2.62 mg/dL — ABNORMAL HIGH (ref 0.61–1.24)
GFR calc Af Amer: 24 mL/min — ABNORMAL LOW (ref >60)
GFR calc non Af Amer: 21 mL/min — ABNORMAL LOW (ref >60)
Glucose, Bld: 99 mg/dL (ref 70–99)
Potassium: 4.8 mmol/L (ref 3.5–5.1)
Sodium: 137 mmol/L (ref 135–145)

## 2019-10-24 NOTE — Telephone Encounter (Signed)
She is doing evaluation, client is cold all the time BP 77/42, pt asymptomatic, pulse is at 50, dropped to 49, on quite a bit of meds, BP meds, diuretics, unsure if all of this is playing a factor in this. Also, if provider wants, labs can be done in the home, to check iron level if recommended.  CB # [id number].  Please leave msg if she does not answer.

## 2019-10-24 NOTE — ED Triage Notes (Signed)
Pt arrives to ED b/c home healthcare nurse stated he needed to be evaluated. Pt thinks nurse said something about his BP or HR but he is unsure. BP 107/51, HR 63.

## 2019-10-24 NOTE — Telephone Encounter (Signed)
Fall 10 days ago and contusion on back  Pulse 56 and BP low  Spoke with PCP today and rec go to hospital daughter Javier [name] frustrated they are waiting in ER long time Labs pending bmet, cbc, UA   TMS

## 2019-10-24 NOTE — Telephone Encounter (Signed)
I spoke to Daneil Dan who is his friend/primary caregiver.  He told me pt has been confused, falling, c/o pain in his back.  Had an episode of fecal incontinence.  I advised him that pt should be seen in the ED. He can bring him this evening but is currently in [place]. Offered to arrange EMS pick up but Mellody Dance states pt would be more comfortable with him bringing pt to hospital rather than EMS. I did try pt on his cell to relay plan but he did not pick up and I could not leave a message.   Report was given to the Triage nurse at the [facility] ED.  I also spoke to [facility] with Frances Furbish and advised her of plan for pt to be brought to ED.

## 2019-10-25 ENCOUNTER — Telehealth: Payer: Self-pay | Admitting: Family

## 2019-10-25 NOTE — Telephone Encounter (Signed)
Patient's daughter Elita Quick called back in regards to her father's ED visit.   Blood pressure complain  Pulse Rate low (56)  Per patient's daughter, patient looked great skin color good and normal acting

## 2019-10-25 NOTE — Telephone Encounter (Signed)
Daughter called back and spoke to front desk. The patient was scheduled for 7/9.

## 2019-10-25 NOTE — ED Notes (Signed)
Patient no longer wants to stay, is leaving with daughter Elita Quick.

## 2019-10-25 NOTE — Telephone Encounter (Signed)
I see that patient did not stay to complete his ER visit.  Please contact his friend Mellody Dance to schedule a follow up visit with me.

## 2019-10-25 NOTE — Telephone Encounter (Signed)
Never mind- daughter called in to schedule him. Tks.

## 2019-10-25 NOTE — Telephone Encounter (Signed)
Caller: Javier [name] back phone number: [phone]  Javier [name] states she when to the hospital as advise with her father they were there till 03:00 am this morning and left without being seen blood work was done. Javier [name] states father is acting very normal, good skin color. She is wondering what should be the next step.   Please advise.

## 2019-10-27 ENCOUNTER — Encounter: Payer: Self-pay | Admitting: Family

## 2019-10-27 ENCOUNTER — Other Ambulatory Visit: Payer: Self-pay

## 2019-10-27 ENCOUNTER — Ambulatory Visit (INDEPENDENT_AMBULATORY_CARE_PROVIDER_SITE_OTHER): Payer: Medicare Other | Admitting: Family

## 2019-10-27 VITALS — BP 94/62 | HR 38 | Temp 98.0°F | Resp 16 | Wt 146.0 lb

## 2019-10-27 DIAGNOSIS — I10 Essential (primary) hypertension: Secondary | ICD-10-CM

## 2019-10-27 DIAGNOSIS — W19XXXA Unspecified fall, initial encounter: Secondary | ICD-10-CM

## 2019-10-27 DIAGNOSIS — D7589 Other specified diseases of blood and blood-forming organs: Secondary | ICD-10-CM | POA: Diagnosis not present

## 2019-10-27 DIAGNOSIS — D649 Anemia, unspecified: Secondary | ICD-10-CM

## 2019-10-27 DIAGNOSIS — E538 Deficiency of other specified B group vitamins: Secondary | ICD-10-CM | POA: Diagnosis not present

## 2019-10-27 DIAGNOSIS — R634 Abnormal weight loss: Secondary | ICD-10-CM

## 2019-10-27 DIAGNOSIS — N289 Disorder of kidney and ureter, unspecified: Secondary | ICD-10-CM | POA: Diagnosis not present

## 2019-10-27 DIAGNOSIS — I2510 Atherosclerotic heart disease of native coronary artery without angina pectoris: Secondary | ICD-10-CM

## 2019-10-27 DIAGNOSIS — H66001 Acute suppurative otitis media without spontaneous rupture of ear drum, right ear: Secondary | ICD-10-CM | POA: Diagnosis not present

## 2019-10-27 DIAGNOSIS — R197 Diarrhea, unspecified: Secondary | ICD-10-CM

## 2019-10-27 DIAGNOSIS — N189 Chronic kidney disease, unspecified: Secondary | ICD-10-CM | POA: Diagnosis not present

## 2019-10-27 MED ORDER — HYDROCHLOROTHIAZIDE 25 MG PO TABS
25.0000 mg | ORAL_TABLET | Freq: Every day | ORAL | 3 refills | Status: DC
Start: 2019-10-27 — End: 2019-12-04

## 2019-10-27 MED ORDER — AMOXICILLIN 500 MG PO CAPS
500.0000 mg | ORAL_CAPSULE | Freq: Three times a day (TID) | ORAL | 0 refills | Status: DC
Start: 2019-10-27 — End: 2020-02-20

## 2019-10-27 MED ORDER — NEOMYCIN-POLYMYXIN-HC 3.5-10000-1 OT SUSP: 3.0000 [drp] | Freq: Three times a day (TID) | OTIC | 0 refills | Status: AC

## 2019-10-27 NOTE — Patient Instructions (Addendum)
Stop lisinopril hctz. Start hctz 25mg  once daily. Return stool samples at your convenience.  Start amoxicillin for ear infection as well as cortisporin ear drops for your right ear. Complete lab work prior to leaving.

## 2019-10-27 NOTE — Progress Notes (Signed)
Subjective:    Patient ID: Javier [name], male    DOB: 1929/08/10, 86 y.o.   MRN: [id number]  HPI  Patient is an 86 yr old male who presents today for ED follow up.  I spoke to his friend Daneil Dan (primary caregiver) on 10/24/19. At that time, he reported that the patient has had increased confusion, frequent falls, back pain, episode of fecal incontinence.  Home health RN noted pt to be hypotensive at her visit. Friend reports that pt has had an overall decline since he had covid-19 last August. We advised that he be brought to the ED.  Pt was brought to the ED, however he waited for a long time and did not wish to stay any longer so his assessment was not complete. He did have labs drawn which included a normal urinalysis, cbc (macrocytic anemia, platelets 121 k).  Acute on chronic renal insufficiency (Cr. was 2.62 with a baseline of 1.78).   Prior to this visit he was seen in the ED on 6/18 after a fall.  CT noted the following:  IMPRESSION: 1. Superior endplate compression fracture at L1 with 30% loss of height. 2. Cortical irregularity suggests a nondisplaced fracture along the right inferior aspect of T12. 3. Remote endplate concavities at L3, L4, and L5 without acute or healing fractures. 4. Right pleural effusion and atelectasis. 5. Aortic Atherosclerosis (ICD10-I70.0).  Cold intolerance-  House is low 80's. He denies black or bloody stools.   + loose stools.    Had some bleeding out of his right ear.    Appetite has gone down in the last 2-3 weeks.  Only eating 1 to 1.5 meals/day.  Wt Readings from Last 3 Encounters:  10/27/19 146 lb (66.2 kg)  10/11/19 158 lb (71.7 kg)  10/06/19 160 lb (72.6 kg)   BP Readings from Last 3 Encounters:  10/27/19 94/62  10/25/19 (!) 100/49  10/11/19 108/68     Review of Systems See HPI  Past Medical History:  Diagnosis Date  . Atrial fibrillation (HCC)   . Bronchitis   . CAD (coronary artery disease)   . Chronic renal  insufficiency   . Congestive heart failure (HCC)   . Diabetes mellitus type II   . GERD (gastroesophageal reflux disease)   . Gout   . Hyperlipidemia   . Hypertension   . Leg cramps   . Osteoarthritis   . Shingles      Social History   Socioeconomic History  . Marital status: Legally Separated    Spouse name: Not on file  . Number of children: Not on file  . Years of education: Not on file  . Highest education level: Not on file  Occupational History  . Not on file  Tobacco Use  . Smoking status: Former Games developer  . Smokeless tobacco: Never Used  . Tobacco comment: quit 40 years ago-40 pack year history  Vaping Use  . Vaping Use: Never used  Substance and Sexual Activity  . Alcohol use: Yes    Comment: drinks liquor on occassion.  . Drug use: No  . Sexual activity: Not on file  Other Topics Concern  . Not on file  Social History Narrative   Last updated: 02/13/2010   Married but separated from wife   Alcohol use-yes   Former Smoker quit 40 yrs ago (40 pack yr history)     Works part time at [employer] near [employer]   Social Determinants of Health  Financial Resource Strain:   . Difficulty of Paying Living Expenses:   Food Insecurity:   . Worried About Programme researcher, broadcasting/film/video in the Last Year:   . Barista in the Last Year:   Transportation Needs:   . Freight forwarder (Medical):   Marland Kitchen Lack of Transportation (Non-Medical):   Physical Activity:   . Days of Exercise per Week:   . Minutes of Exercise per Session:   Stress:   . Feeling of Stress :   Social Connections:   . Frequency of Communication with Friends and Family:   . Frequency of Social Gatherings with Friends and Family:   . Attends Religious Services:   . Active Member of Clubs or Organizations:   . Attends Banker Meetings:   Marland Kitchen Marital Status:   Intimate Partner Violence:   . Fear of Current or Ex-Partner:   . Emotionally Abused:   Marland Kitchen Physically Abused:   .  Sexually Abused:     Past Surgical History:  Procedure Laterality Date  . CARDIOVERSION  11/02/2005   s/p  . CORONARY ARTERY BYPASS GRAFT  05/18/2005  . EYE SURGERY     CATARACT SX 06/2015 both eyes Dr.Beavis per pt  . EYE SURGERY Bilateral 12/09/2016   revision of cataract surgery from 2017.  Marland Kitchen MITRAL VALVE REPAIR  04/2005   s/p mitral valve repair    Family History  Problem Relation Age of Onset  . Diabetes Other        siblings  . Melanoma Brother        died at 65    No Known Allergies  Current Outpatient Medications on File Prior to Visit  Medication Sig Dispense Refill  . allopurinol (ZYLOPRIM) 100 MG tablet TAKE 1 TABLET BY MOUTH DAILY 90 tablet 1  . amLODipine (NORVASC) 5 MG tablet Take 1 tablet (5 mg total) by mouth daily. 90 tablet 3  . aspirin 81 MG tablet Take 81 mg by mouth daily.      . betamethasone valerate ointment (VALISONE) 0.1 % Apply 1 application topically 2 (two) times daily as needed. For rash 30 g 2  . carvedilol (COREG) 3.125 MG tablet Take 1 tablet (3.125 mg total) by mouth 2 (two) times daily with a meal. TAKE 1 TABLET BY MOUTH 2 TIMES DAILY WITH MEALS 180 tablet 3  . colchicine 0.6 MG tablet 2 tabs by mouth at start of gout flare.  Then one tablet 1 hour later. 6 tablet 5  . folic acid (FOLVITE) 1 MG tablet Take 1 tablet (1 mg total) by mouth daily. 30 tablet 5  . furosemide (LASIX) 20 MG tablet TAKE 1 TABLET BY MOUTH DAILY 90 tablet 0  . lisinopril-hydrochlorothiazide (ZESTORETIC) 20-25 MG tablet TAKE 1 TABLET BY MOUTH DAILY 90 tablet 1  . loratadine (CLARITIN) 10 MG tablet Take 1 tablet (10 mg total) by mouth daily. 30 tablet 11  . lovastatin (MEVACOR) 40 MG tablet TAKE 1 TABLET BY MOUTH DAILY 90 tablet 1  . Multiple Vitamins-Minerals (MULTIVITAMIN WITH MINERALS) tablet Take 1 tablet by mouth daily.    . Omega-3 Fatty Acids (FISH OIL) 1000 MG CAPS Take 2 capsules (2,000 mg total) by mouth 2 (two) times daily.  0  . potassium chloride (KLOR-CON) 10  MEQ tablet Take 1 tablet (10 mEq total) by mouth daily. 90 tablet 3  . traMADol (ULTRAM) 50 MG tablet Take 1 tablet (50 mg total) by mouth every 6 (six) hours as needed. 15  tablet 0   No current facility-administered medications on file prior to visit.    BP 94/62 (BP Location: Right Arm, Patient Position: Sitting, Cuff Size: Small)   Pulse (!) 38   Temp 98 F (36.7 C) (Oral)   Resp 16   Wt 146 lb (66.2 kg)   SpO2 99%   BMI 22.87 kg/m       Objective:   Physical Exam Constitutional:      General: He is not in acute distress.    Appearance: He is well-developed.  HENT:     Head: Normocephalic and atraumatic.     Right Ear: Decreased hearing noted.     Left Ear: Tympanic membrane and ear canal normal.     Ears:     Comments: R TM is dull without visible bony landmarks. Purulent fluid noted behind R TM  R ear canal has some erythema, exudate and blood noted Cardiovascular:     Rate and Rhythm: Normal rate and regular rhythm.     Heart sounds: No murmur heard.      Comments: 2-3+ bilateral LE edema Pulmonary:     Effort: Pulmonary effort is normal. No respiratory distress.     Breath sounds: Normal breath sounds. No wheezing or rales.  Genitourinary:    Prostate: Enlarged.     Rectum: Normal. Guaiac result negative.  Skin:    General: Skin is warm and dry.     Comments: Grade 1 sacral decub ulcer noted at top of gluteal fold  Neurological:     Mental Status: He is alert and oriented to person, place, and time.  Psychiatric:        Behavior: Behavior normal.        Thought Content: Thought content normal.           Assessment & Plan:  Right Otitis Media/R Otitis externa- rx with amoxicillin and cortisporin Otic drops.  Falls- likely multifactorial- pt has not been eating well, bp has been low, also has OM. Continue home health PT.  HTN- over treated. Advised pt and caregiver as follows:  Stop lisinopril hctz. Start hctz 25mg  once daily.  Acute on chronic  renal insufficiency- check follow up bmet- enforced importance of hydration.  Diarrhea- check stool studies.  Weight loss- check TSH.  Anemia- (macrocytic) Check b12 and folate. Likely contributing to his cold intolerance.  Compression fracture- new. Pt reports significant improvement in his pain.   Of note-patient was accompanied today by his caregiver with whom he lives.  Patient's daughter also arrived at the appointment today and wished to be brought back into the exam room.  Patient requested that only his friend Mellody Dance be brought back to the exam room. Daughter was not pleased with pt's decision.  45 minutes spent on today's visit. Time was spent reviewing patient record, examining patient, and counseling patient and caregiver.  This visit occurred during the SARS-CoV-2 public health emergency.  Safety protocols were in place, including screening questions prior to the visit, additional usage of staff PPE, and extensive cleaning of exam room while observing appropriate contact time as indicated for disinfecting solutions.

## 2019-10-28 DIAGNOSIS — F0390 Unspecified dementia without behavioral disturbance: Secondary | ICD-10-CM | POA: Diagnosis not present

## 2019-10-28 DIAGNOSIS — M47816 Spondylosis without myelopathy or radiculopathy, lumbar region: Secondary | ICD-10-CM | POA: Diagnosis not present

## 2019-10-28 DIAGNOSIS — E1122 Type 2 diabetes mellitus with diabetic chronic kidney disease: Secondary | ICD-10-CM | POA: Diagnosis not present

## 2019-10-28 DIAGNOSIS — I509 Heart failure, unspecified: Secondary | ICD-10-CM | POA: Diagnosis not present

## 2019-10-28 DIAGNOSIS — I130 Hypertensive heart and chronic kidney disease with heart failure and stage 1 through stage 4 chronic kidney disease, or unspecified chronic kidney disease: Secondary | ICD-10-CM | POA: Diagnosis not present

## 2019-10-28 DIAGNOSIS — I2510 Atherosclerotic heart disease of native coronary artery without angina pectoris: Secondary | ICD-10-CM | POA: Diagnosis not present

## 2019-10-28 LAB — COMPREHENSIVE METABOLIC PANEL
AG Ratio: 1.3 (calc) (ref 1.0–2.5)
ALT: 9 U/L (ref 9–46)
AST: 15 U/L (ref 10–35)
Albumin: 4 g/dL (ref 3.6–5.1)
Alkaline phosphatase (APISO): 86 U/L (ref 35–144)
BUN/Creatinine Ratio: 25 (calc) — ABNORMAL HIGH (ref 6–22)
BUN: 69 mg/dL — ABNORMAL HIGH (ref 7–25)
CO2: 18 mmol/L — ABNORMAL LOW (ref 20–32)
Calcium: 9.6 mg/dL (ref 8.6–10.3)
Chloride: 107 mmol/L (ref 98–110)
Creat: 2.81 mg/dL — ABNORMAL HIGH (ref 0.70–1.11)
Globulin: 3 g/dL (calc) (ref 1.9–3.7)
Glucose, Bld: 94 mg/dL (ref 65–99)
Potassium: 4.4 mmol/L (ref 3.5–5.3)
Sodium: 137 mmol/L (ref 135–146)
Total Bilirubin: 0.7 mg/dL (ref 0.2–1.2)
Total Protein: 7 g/dL (ref 6.1–8.1)

## 2019-10-28 LAB — URINE CULTURE
MICRO NUMBER:: 10686257
Result:: NO GROWTH
SPECIMEN QUALITY:: ADEQUATE

## 2019-10-28 LAB — B12 AND FOLATE PANEL
Folate: 15.5 ng/mL
Vitamin B-12: 503 pg/mL (ref 200–1100)

## 2019-10-28 LAB — TSH: TSH: 1.76 mIU/L (ref 0.40–4.50)

## 2019-10-30 ENCOUNTER — Telehealth: Payer: Self-pay | Admitting: Family

## 2019-10-30 ENCOUNTER — Other Ambulatory Visit: Payer: Self-pay | Admitting: Family

## 2019-10-30 NOTE — Telephone Encounter (Signed)
Per Scarlett at [facility] she would like a copy of patient medication list  to insure that she has all medication in pill packs for patient   Please Advise   Fax Number : [phone]

## 2019-10-31 ENCOUNTER — Other Ambulatory Visit: Payer: Self-pay | Admitting: Family

## 2019-10-31 DIAGNOSIS — I509 Heart failure, unspecified: Secondary | ICD-10-CM | POA: Diagnosis not present

## 2019-10-31 DIAGNOSIS — I130 Hypertensive heart and chronic kidney disease with heart failure and stage 1 through stage 4 chronic kidney disease, or unspecified chronic kidney disease: Secondary | ICD-10-CM | POA: Diagnosis not present

## 2019-10-31 DIAGNOSIS — I2510 Atherosclerotic heart disease of native coronary artery without angina pectoris: Secondary | ICD-10-CM | POA: Diagnosis not present

## 2019-10-31 DIAGNOSIS — F0390 Unspecified dementia without behavioral disturbance: Secondary | ICD-10-CM | POA: Diagnosis not present

## 2019-10-31 DIAGNOSIS — M47816 Spondylosis without myelopathy or radiculopathy, lumbar region: Secondary | ICD-10-CM | POA: Diagnosis not present

## 2019-10-31 DIAGNOSIS — E1122 Type 2 diabetes mellitus with diabetic chronic kidney disease: Secondary | ICD-10-CM | POA: Diagnosis not present

## 2019-10-31 NOTE — Telephone Encounter (Signed)
Current med list faxed to [facility] at number provided. Confirmation received.

## 2019-11-02 ENCOUNTER — Telehealth: Payer: Self-pay | Admitting: Family

## 2019-11-02 DIAGNOSIS — I2510 Atherosclerotic heart disease of native coronary artery without angina pectoris: Secondary | ICD-10-CM | POA: Diagnosis not present

## 2019-11-02 DIAGNOSIS — E1122 Type 2 diabetes mellitus with diabetic chronic kidney disease: Secondary | ICD-10-CM | POA: Diagnosis not present

## 2019-11-02 DIAGNOSIS — I509 Heart failure, unspecified: Secondary | ICD-10-CM | POA: Diagnosis not present

## 2019-11-02 DIAGNOSIS — I130 Hypertensive heart and chronic kidney disease with heart failure and stage 1 through stage 4 chronic kidney disease, or unspecified chronic kidney disease: Secondary | ICD-10-CM | POA: Diagnosis not present

## 2019-11-02 DIAGNOSIS — F0390 Unspecified dementia without behavioral disturbance: Secondary | ICD-10-CM | POA: Diagnosis not present

## 2019-11-02 DIAGNOSIS — M47816 Spondylosis without myelopathy or radiculopathy, lumbar region: Secondary | ICD-10-CM | POA: Diagnosis not present

## 2019-11-02 NOTE — Telephone Encounter (Signed)
Caller: [name] Call back phone number: [phone]  Patient heart rate was at 42 this morning. Patient had no other symptoms.  Please advise

## 2019-11-02 NOTE — Telephone Encounter (Signed)
Please call and ask for updated heart rate 7/16.

## 2019-11-06 ENCOUNTER — Other Ambulatory Visit: Payer: Self-pay

## 2019-11-06 ENCOUNTER — Ambulatory Visit (INDEPENDENT_AMBULATORY_CARE_PROVIDER_SITE_OTHER): Payer: Medicare Other | Admitting: Family

## 2019-11-06 VITALS — BP 124/70 | HR 53 | Temp 97.8°F | Resp 16 | Ht 67.5 in | Wt 150.0 lb

## 2019-11-06 DIAGNOSIS — H663X1 Other chronic suppurative otitis media, right ear: Secondary | ICD-10-CM | POA: Diagnosis not present

## 2019-11-06 DIAGNOSIS — I10 Essential (primary) hypertension: Secondary | ICD-10-CM

## 2019-11-06 DIAGNOSIS — R634 Abnormal weight loss: Secondary | ICD-10-CM

## 2019-11-06 DIAGNOSIS — H6690 Otitis media, unspecified, unspecified ear: Secondary | ICD-10-CM

## 2019-11-06 DIAGNOSIS — I2510 Atherosclerotic heart disease of native coronary artery without angina pectoris: Secondary | ICD-10-CM | POA: Diagnosis not present

## 2019-11-06 DIAGNOSIS — R197 Diarrhea, unspecified: Secondary | ICD-10-CM

## 2019-11-06 LAB — BASIC METABOLIC PANEL
BUN: 45 mg/dL — ABNORMAL HIGH (ref 6–23)
CO2: 29 mEq/L (ref 19–32)
Calcium: 9 mg/dL (ref 8.4–10.5)
Chloride: 103 mEq/L (ref 96–112)
Creatinine, Ser: 1.74 mg/dL — ABNORMAL HIGH (ref 0.40–1.50)
GFR: 37.22 mL/min — ABNORMAL LOW (ref >60.00)
Glucose, Bld: 155 mg/dL — ABNORMAL HIGH (ref 70–99)
Potassium: 4.2 mEq/L (ref 3.5–5.1)
Sodium: 139 mEq/L (ref 135–145)

## 2019-11-06 NOTE — Progress Notes (Signed)
Subjective:    Patient ID: Javier [name], male    DOB: 10-09-1929, 86 y.o.   MRN: [id number]  HPI   Patient is an 86 yr old male who presents today for follow up. He reports that he feels "a whole lot better."  He is accompanied today again by his friend.   Right Otitis Media/R otitis Externa- last visit we treated him with amoxicillin and cortisporin otic drops.  HTN- last visit he was noted to be overtreated.  We stopped lisinopril hctz and started plain hctz 25mg  once daily. BP Readings from Last 3 Encounters:  11/06/19 124/70  10/27/19 94/62  10/25/19 (!) 100/49   Diarrhea- resolved.   Weight loss-  Wt Readings from Last 3 Encounters:  11/06/19 150 lb (68 kg)  10/27/19 146 lb (66.2 kg)  10/11/19 158 lb (71.7 kg)       Review of Systems    see HPI  Past Medical History:  Diagnosis Date  . Atrial fibrillation (HCC)   . Bronchitis   . CAD (coronary artery disease)   . Chronic renal insufficiency   . Congestive heart failure (HCC)   . Diabetes mellitus type II   . GERD (gastroesophageal reflux disease)   . Gout   . Hyperlipidemia   . Hypertension   . Leg cramps   . Osteoarthritis   . Shingles      Social History   Socioeconomic History  . Marital status: Legally Separated    Spouse name: Not on file  . Number of children: Not on file  . Years of education: Not on file  . Highest education level: Not on file  Occupational History  . Not on file  Tobacco Use  . Smoking status: Former 10/13/19  . Smokeless tobacco: Never Used  . Tobacco comment: quit 40 years ago-40 pack year history  Vaping Use  . Vaping Use: Never used  Substance and Sexual Activity  . Alcohol use: Yes    Comment: drinks liquor on occassion.  . Drug use: No  . Sexual activity: Not on file  Other Topics Concern  . Not on file  Social History Narrative   Last updated: 02/13/2010   Married but separated from wife   Alcohol use-yes   Former Smoker quit 40 yrs ago (40 pack yr  history)     Works part time at [employer] near [employer]   Social Determinants of [employer] Strain:   . Difficulty of Paying Living Expenses:   Food Insecurity:   . Worried About Corporate investment banker in the Last Year:   . Programme researcher, broadcasting/film/video in the Last Year:   Transportation Needs:   . Barista (Medical):   Freight forwarder Lack of Transportation (Non-Medical):   Physical Activity:   . Days of Exercise per Week:   . Minutes of Exercise per Session:   Stress:   . Feeling of Stress :   Social Connections:   . Frequency of Communication with Friends and Family:   . Frequency of Social Gatherings with Friends and Family:   . Attends Religious Services:   . Active Member of Clubs or Organizations:   . Attends Marland Kitchen Meetings:   Banker Marital Status:   Intimate Partner Violence:   . Fear of Current or Ex-Partner:   . Emotionally Abused:   Marland Kitchen Physically Abused:   . Sexually Abused:     Past Surgical History:  Procedure Laterality  Date  . CARDIOVERSION  11/02/2005   s/p  . CORONARY ARTERY BYPASS GRAFT  05/18/2005  . EYE SURGERY     CATARACT SX 06/2015 both eyes Dr.Beavis per pt  . EYE SURGERY Bilateral 12/09/2016   revision of cataract surgery from 2017.  Marland Kitchen MITRAL VALVE REPAIR  04/2005   s/p mitral valve repair    Family History  Problem Relation Age of Onset  . Diabetes Other        siblings  . Melanoma Brother        died at 70    No Known Allergies  Current Outpatient Medications on File Prior to Visit  Medication Sig Dispense Refill  . COLCRYS 0.6 MG tablet TAKE 2 TABLETS BY MOUTH AT START OF GOUT FLARE. THEN TAKE 1 TABLET 1 HOUR LATER 6 tablet 5  . furosemide (LASIX) 20 MG tablet Take 1 tablet (20 mg total) by mouth daily. 90 tablet 1  . allopurinol (ZYLOPRIM) 100 MG tablet TAKE 1 TABLET BY MOUTH DAILY 90 tablet 1  . amLODipine (NORVASC) 5 MG tablet Take 1 tablet (5 mg total) by mouth daily. 90 tablet 3  . amoxicillin  (AMOXIL) 500 MG capsule Take 1 capsule (500 mg total) by mouth 3 (three) times daily. 30 capsule 0  . aspirin 81 MG tablet Take 81 mg by mouth daily.      . betamethasone valerate ointment (VALISONE) 0.1 % Apply 1 application topically 2 (two) times daily as needed. For rash 30 g 2  . carvedilol (COREG) 3.125 MG tablet Take 1 tablet (3.125 mg total) by mouth 2 (two) times daily with a meal. TAKE 1 TABLET BY MOUTH 2 TIMES DAILY WITH MEALS 180 tablet 3  . folic acid (FOLVITE) 1 MG tablet TAKE 1 TABLET BY MOUTH DAILY 30 tablet 5  . hydrochlorothiazide (HYDRODIURIL) 25 MG tablet Take 1 tablet (25 mg total) by mouth daily. 90 tablet 3  . loratadine (CLARITIN) 10 MG tablet Take 1 tablet (10 mg total) by mouth daily. 30 tablet 11  . lovastatin (MEVACOR) 40 MG tablet TAKE 1 TABLET BY MOUTH DAILY 90 tablet 1  . Multiple Vitamins-Minerals (MULTIVITAMIN WITH MINERALS) tablet Take 1 tablet by mouth daily.    . Omega-3 Fatty Acids (FISH OIL) 1000 MG CAPS Take 2 capsules (2,000 mg total) by mouth 2 (two) times daily.  0  . potassium chloride (KLOR-CON) 10 MEQ tablet Take 1 tablet (10 mEq total) by mouth daily. 90 tablet 3  . traMADol (ULTRAM) 50 MG tablet Take 1 tablet (50 mg total) by mouth every 6 (six) hours as needed. 15 tablet 0   No current facility-administered medications on file prior to visit.    BP 124/70 (BP Location: Right Arm, Patient Position: Sitting, Cuff Size: Small)   Pulse (!) 53   Temp 97.8 F (36.6 C) (Temporal)   Resp 16   Ht 5' 7.5" (1.715 m)   Wt 150 lb (68 kg)   SpO2 100%   BMI 23.15 kg/m    Objective:   Physical Exam Constitutional:      General: He is not in acute distress.    Appearance: He is well-developed.  HENT:     Head: Normocephalic and atraumatic.     Right Ear: Ear canal normal.     Left Ear: Tympanic membrane and ear canal normal.     Ears:     Comments: R TM is still dull with purulent fluid noted behind TM Cardiovascular:  Rate and Rhythm: Normal  rate and regular rhythm.     Heart sounds: No murmur heard.   Pulmonary:     Effort: Pulmonary effort is normal. No respiratory distress.     Breath sounds: Normal breath sounds. No wheezing or rales.  Skin:    General: Skin is warm and dry.  Neurological:     Mental Status: He is alert and oriented to person, place, and time.  Psychiatric:        Behavior: Behavior normal.        Thought Content: Thought content normal.           Assessment & Plan:  Otitis Media- Clinically improved but exam is unchanged.  Will refer to ENT for further evaluation.  Otitis externa- resolved.  HTN- follow up bp is improved.  Monitor.  Diarrhea- resolved. Will cancel stool study orders.  Weight loss- weight is up a few pounds and he reports improved appetite.   This visit occurred during the SARS-CoV-2 public health emergency.  Safety protocols were in place, including screening questions prior to the visit, additional usage of staff PPE, and extensive cleaning of exam room while observing appropriate contact time as indicated for disinfecting solutions.

## 2019-11-06 NOTE — Patient Instructions (Addendum)
Please complete lab work prior to leaving. You should be contacted about scheduling your appointment with the Ear doctor.  Glad you are feeling better!

## 2019-11-07 ENCOUNTER — Encounter: Payer: Self-pay | Admitting: Family

## 2019-11-07 ENCOUNTER — Ambulatory Visit: Payer: Medicare Other | Admitting: Family

## 2019-11-07 DIAGNOSIS — E1122 Type 2 diabetes mellitus with diabetic chronic kidney disease: Secondary | ICD-10-CM | POA: Diagnosis not present

## 2019-11-07 DIAGNOSIS — I509 Heart failure, unspecified: Secondary | ICD-10-CM | POA: Diagnosis not present

## 2019-11-07 DIAGNOSIS — F0390 Unspecified dementia without behavioral disturbance: Secondary | ICD-10-CM | POA: Diagnosis not present

## 2019-11-07 DIAGNOSIS — I130 Hypertensive heart and chronic kidney disease with heart failure and stage 1 through stage 4 chronic kidney disease, or unspecified chronic kidney disease: Secondary | ICD-10-CM | POA: Diagnosis not present

## 2019-11-07 DIAGNOSIS — M47816 Spondylosis without myelopathy or radiculopathy, lumbar region: Secondary | ICD-10-CM | POA: Diagnosis not present

## 2019-11-07 DIAGNOSIS — I2510 Atherosclerotic heart disease of native coronary artery without angina pectoris: Secondary | ICD-10-CM | POA: Diagnosis not present

## 2019-11-08 DIAGNOSIS — I509 Heart failure, unspecified: Secondary | ICD-10-CM | POA: Diagnosis not present

## 2019-11-08 DIAGNOSIS — M47816 Spondylosis without myelopathy or radiculopathy, lumbar region: Secondary | ICD-10-CM | POA: Diagnosis not present

## 2019-11-08 DIAGNOSIS — I130 Hypertensive heart and chronic kidney disease with heart failure and stage 1 through stage 4 chronic kidney disease, or unspecified chronic kidney disease: Secondary | ICD-10-CM | POA: Diagnosis not present

## 2019-11-08 DIAGNOSIS — E1122 Type 2 diabetes mellitus with diabetic chronic kidney disease: Secondary | ICD-10-CM | POA: Diagnosis not present

## 2019-11-08 DIAGNOSIS — F0390 Unspecified dementia without behavioral disturbance: Secondary | ICD-10-CM | POA: Diagnosis not present

## 2019-11-08 DIAGNOSIS — I2510 Atherosclerotic heart disease of native coronary artery without angina pectoris: Secondary | ICD-10-CM | POA: Diagnosis not present

## 2019-11-09 ENCOUNTER — Encounter: Payer: Self-pay | Admitting: Family

## 2019-11-09 NOTE — Progress Notes (Signed)
Mailed out to pt 

## 2019-11-14 DIAGNOSIS — I130 Hypertensive heart and chronic kidney disease with heart failure and stage 1 through stage 4 chronic kidney disease, or unspecified chronic kidney disease: Secondary | ICD-10-CM | POA: Diagnosis not present

## 2019-11-14 DIAGNOSIS — I2510 Atherosclerotic heart disease of native coronary artery without angina pectoris: Secondary | ICD-10-CM | POA: Diagnosis not present

## 2019-11-14 DIAGNOSIS — M47816 Spondylosis without myelopathy or radiculopathy, lumbar region: Secondary | ICD-10-CM | POA: Diagnosis not present

## 2019-11-14 DIAGNOSIS — F0390 Unspecified dementia without behavioral disturbance: Secondary | ICD-10-CM | POA: Diagnosis not present

## 2019-11-14 DIAGNOSIS — E1122 Type 2 diabetes mellitus with diabetic chronic kidney disease: Secondary | ICD-10-CM | POA: Diagnosis not present

## 2019-11-14 DIAGNOSIS — I509 Heart failure, unspecified: Secondary | ICD-10-CM | POA: Diagnosis not present

## 2019-11-15 DIAGNOSIS — E1122 Type 2 diabetes mellitus with diabetic chronic kidney disease: Secondary | ICD-10-CM | POA: Diagnosis not present

## 2019-11-15 DIAGNOSIS — M47816 Spondylosis without myelopathy or radiculopathy, lumbar region: Secondary | ICD-10-CM | POA: Diagnosis not present

## 2019-11-15 DIAGNOSIS — I130 Hypertensive heart and chronic kidney disease with heart failure and stage 1 through stage 4 chronic kidney disease, or unspecified chronic kidney disease: Secondary | ICD-10-CM | POA: Diagnosis not present

## 2019-11-15 DIAGNOSIS — I509 Heart failure, unspecified: Secondary | ICD-10-CM | POA: Diagnosis not present

## 2019-11-15 DIAGNOSIS — F0390 Unspecified dementia without behavioral disturbance: Secondary | ICD-10-CM | POA: Diagnosis not present

## 2019-11-15 DIAGNOSIS — I2510 Atherosclerotic heart disease of native coronary artery without angina pectoris: Secondary | ICD-10-CM | POA: Diagnosis not present

## 2019-11-17 DIAGNOSIS — Z9181 History of falling: Secondary | ICD-10-CM | POA: Diagnosis not present

## 2019-11-17 DIAGNOSIS — I2510 Atherosclerotic heart disease of native coronary artery without angina pectoris: Secondary | ICD-10-CM | POA: Diagnosis not present

## 2019-11-17 DIAGNOSIS — I130 Hypertensive heart and chronic kidney disease with heart failure and stage 1 through stage 4 chronic kidney disease, or unspecified chronic kidney disease: Secondary | ICD-10-CM | POA: Diagnosis not present

## 2019-11-17 DIAGNOSIS — I509 Heart failure, unspecified: Secondary | ICD-10-CM | POA: Diagnosis not present

## 2019-11-17 DIAGNOSIS — R350 Frequency of micturition: Secondary | ICD-10-CM | POA: Diagnosis not present

## 2019-11-17 DIAGNOSIS — E785 Hyperlipidemia, unspecified: Secondary | ICD-10-CM | POA: Diagnosis not present

## 2019-11-17 DIAGNOSIS — E1122 Type 2 diabetes mellitus with diabetic chronic kidney disease: Secondary | ICD-10-CM | POA: Diagnosis not present

## 2019-11-17 DIAGNOSIS — I4891 Unspecified atrial fibrillation: Secondary | ICD-10-CM | POA: Diagnosis not present

## 2019-11-17 DIAGNOSIS — M1030 Gout due to renal impairment, unspecified site: Secondary | ICD-10-CM | POA: Diagnosis not present

## 2019-11-17 DIAGNOSIS — J9690 Respiratory failure, unspecified, unspecified whether with hypoxia or hypercapnia: Secondary | ICD-10-CM | POA: Diagnosis not present

## 2019-11-17 DIAGNOSIS — Z87440 Personal history of urinary (tract) infections: Secondary | ICD-10-CM | POA: Diagnosis not present

## 2019-11-17 DIAGNOSIS — Z7982 Long term (current) use of aspirin: Secondary | ICD-10-CM | POA: Diagnosis not present

## 2019-11-17 DIAGNOSIS — Z85118 Personal history of other malignant neoplasm of bronchus and lung: Secondary | ICD-10-CM | POA: Diagnosis not present

## 2019-11-17 DIAGNOSIS — K219 Gastro-esophageal reflux disease without esophagitis: Secondary | ICD-10-CM | POA: Diagnosis not present

## 2019-11-17 DIAGNOSIS — H548 Legal blindness, as defined in USA: Secondary | ICD-10-CM | POA: Diagnosis not present

## 2019-11-17 DIAGNOSIS — F0390 Unspecified dementia without behavioral disturbance: Secondary | ICD-10-CM | POA: Diagnosis not present

## 2019-11-17 DIAGNOSIS — N189 Chronic kidney disease, unspecified: Secondary | ICD-10-CM | POA: Diagnosis not present

## 2019-11-17 DIAGNOSIS — M47816 Spondylosis without myelopathy or radiculopathy, lumbar region: Secondary | ICD-10-CM | POA: Diagnosis not present

## 2019-11-23 DIAGNOSIS — M47816 Spondylosis without myelopathy or radiculopathy, lumbar region: Secondary | ICD-10-CM | POA: Diagnosis not present

## 2019-11-23 DIAGNOSIS — F0390 Unspecified dementia without behavioral disturbance: Secondary | ICD-10-CM | POA: Diagnosis not present

## 2019-11-23 DIAGNOSIS — I130 Hypertensive heart and chronic kidney disease with heart failure and stage 1 through stage 4 chronic kidney disease, or unspecified chronic kidney disease: Secondary | ICD-10-CM | POA: Diagnosis not present

## 2019-11-23 DIAGNOSIS — I2510 Atherosclerotic heart disease of native coronary artery without angina pectoris: Secondary | ICD-10-CM | POA: Diagnosis not present

## 2019-11-23 DIAGNOSIS — E1122 Type 2 diabetes mellitus with diabetic chronic kidney disease: Secondary | ICD-10-CM | POA: Diagnosis not present

## 2019-11-23 DIAGNOSIS — I509 Heart failure, unspecified: Secondary | ICD-10-CM | POA: Diagnosis not present

## 2019-11-24 DIAGNOSIS — F0390 Unspecified dementia without behavioral disturbance: Secondary | ICD-10-CM | POA: Diagnosis not present

## 2019-11-24 DIAGNOSIS — I2510 Atherosclerotic heart disease of native coronary artery without angina pectoris: Secondary | ICD-10-CM | POA: Diagnosis not present

## 2019-11-24 DIAGNOSIS — I130 Hypertensive heart and chronic kidney disease with heart failure and stage 1 through stage 4 chronic kidney disease, or unspecified chronic kidney disease: Secondary | ICD-10-CM | POA: Diagnosis not present

## 2019-11-24 DIAGNOSIS — I509 Heart failure, unspecified: Secondary | ICD-10-CM | POA: Diagnosis not present

## 2019-11-24 DIAGNOSIS — M47816 Spondylosis without myelopathy or radiculopathy, lumbar region: Secondary | ICD-10-CM | POA: Diagnosis not present

## 2019-11-24 DIAGNOSIS — E1122 Type 2 diabetes mellitus with diabetic chronic kidney disease: Secondary | ICD-10-CM | POA: Diagnosis not present

## 2019-11-29 DIAGNOSIS — E1122 Type 2 diabetes mellitus with diabetic chronic kidney disease: Secondary | ICD-10-CM | POA: Diagnosis not present

## 2019-11-29 DIAGNOSIS — F0390 Unspecified dementia without behavioral disturbance: Secondary | ICD-10-CM | POA: Diagnosis not present

## 2019-11-29 DIAGNOSIS — I130 Hypertensive heart and chronic kidney disease with heart failure and stage 1 through stage 4 chronic kidney disease, or unspecified chronic kidney disease: Secondary | ICD-10-CM | POA: Diagnosis not present

## 2019-11-29 DIAGNOSIS — I509 Heart failure, unspecified: Secondary | ICD-10-CM | POA: Diagnosis not present

## 2019-11-29 DIAGNOSIS — M47816 Spondylosis without myelopathy or radiculopathy, lumbar region: Secondary | ICD-10-CM | POA: Diagnosis not present

## 2019-11-29 DIAGNOSIS — I2510 Atherosclerotic heart disease of native coronary artery without angina pectoris: Secondary | ICD-10-CM | POA: Diagnosis not present

## 2019-12-01 DIAGNOSIS — I130 Hypertensive heart and chronic kidney disease with heart failure and stage 1 through stage 4 chronic kidney disease, or unspecified chronic kidney disease: Secondary | ICD-10-CM | POA: Diagnosis not present

## 2019-12-01 DIAGNOSIS — E1122 Type 2 diabetes mellitus with diabetic chronic kidney disease: Secondary | ICD-10-CM | POA: Diagnosis not present

## 2019-12-01 DIAGNOSIS — M47816 Spondylosis without myelopathy or radiculopathy, lumbar region: Secondary | ICD-10-CM | POA: Diagnosis not present

## 2019-12-01 DIAGNOSIS — I2510 Atherosclerotic heart disease of native coronary artery without angina pectoris: Secondary | ICD-10-CM | POA: Diagnosis not present

## 2019-12-01 DIAGNOSIS — F0390 Unspecified dementia without behavioral disturbance: Secondary | ICD-10-CM | POA: Diagnosis not present

## 2019-12-01 DIAGNOSIS — I509 Heart failure, unspecified: Secondary | ICD-10-CM | POA: Diagnosis not present

## 2019-12-04 ENCOUNTER — Other Ambulatory Visit: Payer: Self-pay | Admitting: Family

## 2019-12-07 DIAGNOSIS — I2510 Atherosclerotic heart disease of native coronary artery without angina pectoris: Secondary | ICD-10-CM | POA: Diagnosis not present

## 2019-12-07 DIAGNOSIS — M47816 Spondylosis without myelopathy or radiculopathy, lumbar region: Secondary | ICD-10-CM | POA: Diagnosis not present

## 2019-12-07 DIAGNOSIS — E1122 Type 2 diabetes mellitus with diabetic chronic kidney disease: Secondary | ICD-10-CM | POA: Diagnosis not present

## 2019-12-07 DIAGNOSIS — I509 Heart failure, unspecified: Secondary | ICD-10-CM | POA: Diagnosis not present

## 2019-12-07 DIAGNOSIS — I130 Hypertensive heart and chronic kidney disease with heart failure and stage 1 through stage 4 chronic kidney disease, or unspecified chronic kidney disease: Secondary | ICD-10-CM | POA: Diagnosis not present

## 2019-12-07 DIAGNOSIS — F0390 Unspecified dementia without behavioral disturbance: Secondary | ICD-10-CM | POA: Diagnosis not present

## 2019-12-13 DIAGNOSIS — F0390 Unspecified dementia without behavioral disturbance: Secondary | ICD-10-CM | POA: Diagnosis not present

## 2019-12-13 DIAGNOSIS — I130 Hypertensive heart and chronic kidney disease with heart failure and stage 1 through stage 4 chronic kidney disease, or unspecified chronic kidney disease: Secondary | ICD-10-CM | POA: Diagnosis not present

## 2019-12-13 DIAGNOSIS — I509 Heart failure, unspecified: Secondary | ICD-10-CM | POA: Diagnosis not present

## 2019-12-13 DIAGNOSIS — I2510 Atherosclerotic heart disease of native coronary artery without angina pectoris: Secondary | ICD-10-CM | POA: Diagnosis not present

## 2019-12-13 DIAGNOSIS — M47816 Spondylosis without myelopathy or radiculopathy, lumbar region: Secondary | ICD-10-CM | POA: Diagnosis not present

## 2019-12-13 DIAGNOSIS — E1122 Type 2 diabetes mellitus with diabetic chronic kidney disease: Secondary | ICD-10-CM | POA: Diagnosis not present

## 2019-12-14 ENCOUNTER — Other Ambulatory Visit: Payer: Self-pay

## 2019-12-14 ENCOUNTER — Ambulatory Visit (INDEPENDENT_AMBULATORY_CARE_PROVIDER_SITE_OTHER): Payer: Medicare Other | Admitting: Otolaryngology

## 2019-12-14 VITALS — Temp 97.3°F

## 2019-12-14 DIAGNOSIS — H60311 Diffuse otitis externa, right ear: Secondary | ICD-10-CM | POA: Diagnosis not present

## 2019-12-14 DIAGNOSIS — I2510 Atherosclerotic heart disease of native coronary artery without angina pectoris: Secondary | ICD-10-CM

## 2019-12-14 NOTE — Progress Notes (Signed)
HPI: Javier [name] is a 86 y.o. male who presents is referred by by his PCP for evaluation of chronic right external otitis with bleeding.  He has been on several rounds of antibiotic eardrops as well as antibiotics.  He presents with his friend who states that the amoxicillin seemed to help the most.  But he has been on 3-4 rounds of antibiotic eardrops with persistent drainage.  Past Medical History:  Diagnosis Date  . Atrial fibrillation (HCC)   . Bronchitis   . CAD (coronary artery disease)   . Chronic renal insufficiency   . Congestive heart failure (HCC)   . Diabetes mellitus type II   . GERD (gastroesophageal reflux disease)   . Gout   . Hyperlipidemia   . Hypertension   . Leg cramps   . Osteoarthritis   . Shingles    Past Surgical History:  Procedure Laterality Date  . CARDIOVERSION  11/02/2005   s/p  . CORONARY ARTERY BYPASS GRAFT  05/18/2005  . EYE SURGERY     CATARACT SX 06/2015 both eyes Dr.Beavis per pt  . EYE SURGERY Bilateral 12/09/2016   revision of cataract surgery from 2017.  Marland Kitchen MITRAL VALVE REPAIR  04/2005   s/p mitral valve repair   Social History   Socioeconomic History  . Marital status: Legally Separated    Spouse name: Not on file  . Number of children: Not on file  . Years of education: Not on file  . Highest education level: Not on file  Occupational History  . Not on file  Tobacco Use  . Smoking status: Former Games developer  . Smokeless tobacco: Never Used  . Tobacco comment: quit 40 years ago-40 pack year history  Vaping Use  . Vaping Use: Never used  Substance and Sexual Activity  . Alcohol use: Yes    Comment: drinks liquor on occassion.  . Drug use: No  . Sexual activity: Not on file  Other Topics Concern  . Not on file  Social History Narrative   Last updated: 02/13/2010   Married but separated from wife   Alcohol use-yes   Former Smoker quit 40 yrs ago (40 pack yr history)     Works part time at [employer] near [employer]    Social Determinants of Health   Financial Resource Strain:   . Difficulty of Paying Living Expenses: Not on file  Food Insecurity:   . Worried About Programme researcher, broadcasting/film/video in the Last Year: Not on file  . Ran Out of Food in the Last Year: Not on file  Transportation Needs:   . Lack of Transportation (Medical): Not on file  . Lack of Transportation (Non-Medical): Not on file  Physical Activity:   . Days of Exercise per Week: Not on file  . Minutes of Exercise per Session: Not on file  Stress:   . Feeling of Stress : Not on file  Social Connections:   . Frequency of Communication with Friends and Family: Not on file  . Frequency of Social Gatherings with Friends and Family: Not on file  . Attends Religious Services: Not on file  . Active Member of Clubs or Organizations: Not on file  . Attends Banker Meetings: Not on file  . Marital Status: Not on file   Family History  Problem Relation Age of Onset  . Diabetes Other        siblings  . Melanoma Brother  died at 48   No Known Allergies Prior to Admission medications   Medication Sig Start Date End Date Taking? Authorizing Provider  allopurinol (ZYLOPRIM) 100 MG tablet NEW PRESCRIPTION REQUEST: TAKE ONE TABLET BY MOUTH DAILY 12/04/19  Yes Sandford Craze, NP  amLODipine (NORVASC) 5 MG tablet NEW PRESCRIPTION REQUEST: TAKE ONE TABLET BY MOUTH DAILY 12/04/19  Yes Sandford Craze, NP  amoxicillin (AMOXIL) 500 MG capsule Take 1 capsule (500 mg total) by mouth 3 (three) times daily. 10/27/19  Yes Sandford Craze, NP  aspirin 81 MG tablet Take 81 mg by mouth daily.     Yes [provider]  ASPIRIN LOW DOSE 81 MG EC tablet NEW PRESCRIPTION REQUEST: TAKE ONE TABLET BY MOUTH DAILY 12/04/19  Yes Sandford Craze, NP  betamethasone valerate (VALISONE) 0.1 % cream NEW PRESCRIPTION REQUEST: APPLY TOPICALLY TWICE DAILY TO AFFECTED AREA(S) TO RASH AS NEEDED FOR REDNESS AND FOR ITCHING 12/04/19  Yes  Sandford Craze, NP  betamethasone valerate ointment (VALISONE) 0.1 % Apply 1 application topically 2 (two) times daily as needed. For rash 03/14/19  Yes Sandford Craze, NP  carvedilol (COREG) 3.125 MG tablet NEW PRESCRIPTION REQUEST: TAKE ONE TABLET BY MOUTH TWICE DAILY 12/04/19  Yes Sandford Craze, NP  colchicine 0.6 MG tablet NEW PRESCRIPTION REQUEST: TAKE TWO TABLETS BY MOUTH DAILY AT START OF GOUT SYMPTOMS AND THEN TAKE ONE TABLET BY MOUTH ONE HOUR LATER AS NEEDED 12/04/19  Yes Sandford Craze, NP  folic acid (FOLVITE) 1 MG tablet NEW PRESCRIPTION REQUEST: TAKE ONE TABLET BY MOUTH DAILY 12/04/19  Yes Sandford Craze, NP  furosemide (LASIX) 20 MG tablet NEW PRESCRIPTION REQUEST: TAKE ONE TABLET BY MOUTH DAILY 12/04/19  Yes Sandford Craze, NP  hydrochlorothiazide (HYDRODIURIL) 25 MG tablet NEW PRESCRIPTION REQUEST: TAKE ONE TABLET BY MOUTH DAILY 12/04/19  Yes Sandford Craze, NP  loratadine (CLARITIN) 10 MG tablet NEW PRESCRIPTION REQUEST: TAKE ONE TABLET BY MOUTH DAILY 12/04/19  Yes Sandford Craze, NP  lovastatin (MEVACOR) 40 MG tablet NEW PRESCRIPTION REQUEST: TAKE ONE TABLET BY MOUTH DAILY 12/04/19  Yes Sandford Craze, NP  Multiple Vitamins-Minerals (MULTIVITAMIN WITH MINERALS) tablet Take 1 tablet by mouth daily. 11/17/18  Yes Sandford Craze, NP  Omega-3 Fatty Acids (FISH OIL) 1000 MG CAPS NEW PRESCRIPTION REQUEST: TAKE ONE CAPSULE BY MOUTH TWICE DAILY 12/04/19  Yes Sandford Craze, NP  potassium chloride (KLOR-CON) 10 MEQ tablet NEW PRESCRIPTION REQUEST: TAKE ONE TABLET BY MOUTH DAILY 12/04/19  Yes Sandford Craze, NP  traMADol (ULTRAM) 50 MG tablet Take 1 tablet (50 mg total) by mouth every 6 (six) hours as needed. 10/06/19  Yes Vanetta Mulders, MD     Positive ROS: Otherwise negative  All other systems have been reviewed and were otherwise negative with the exception of those mentioned in the HPI and as above.  Physical Exam: Constitutional:  Alert, well-appearing, no acute distress Ears: External ears without lesions or tenderness.  Left ear canal and TM are clear.  Right ear canal reveals chronic external otitis with granulation tissue that was friable and bled easily on suction.  Ear canal was copiously cleaned with hydroperoxide.  After cleaning the ear canal I applied gentian violet, Ciprodex and CSF powder to the right ear. Nasal: External nose without lesions. Clear nasal passages Oral: Lips and gums without lesions. Tongue and palate mucosa without lesions. Posterior oropharynx clear. Neck: No palpable adenopathy or masses Respiratory: Breathing comfortably  Skin: No facial/neck lesions or rash noted.  Binocular microscopy  Date/Time: 12/14/2019 2:44 PM Performed by: Drema Halon,  MD Authorized by: Drema Halon, MD   Consent:    Consent obtained:  Verbal   Consent given by:  Patient   Alternatives discussed:  No treatment Procedure details:    Indications: otitis externa and examination of tympanic membrane     Scope location: right ear     Right speculum size:  4 Cerumen:    Right ear: normal quantity of cerumen     Left ear: normal quantity of cerumen   Right ear canal:    bloody, edematous and erythema   Right tympanic membrane:    Mobility: fully mobile   Post-procedure details:    Patient tolerance of procedure:  Tolerated well, no immediate complications Comments:     On microscopic exam of the ears left ear canal left TM are clear.  Right ear canal reveals drainage granulation tissue which was suctioned and cleaned and irrigated with hydroperoxide.  After cleaning the ear canal I applied gentian violet Ciprodex and CSF powder to the right ear.    Assessment: Chronic right external otitis with granulation tissue  Plan: Applied gentian violet, Ciprodex and CSF powder to the right ear in the office today. Prescribed Augmentin 875 mg twice daily for 10 days. Also prescribed Cortisporin  otic suspension drops 4 to 5 drops of twice daily if he has any further drainage from the ear. He will follow-up in 11 to 12 days for recheck.   Narda Bonds, MD   CC:

## 2019-12-15 DIAGNOSIS — I2510 Atherosclerotic heart disease of native coronary artery without angina pectoris: Secondary | ICD-10-CM | POA: Diagnosis not present

## 2019-12-15 DIAGNOSIS — F0390 Unspecified dementia without behavioral disturbance: Secondary | ICD-10-CM | POA: Diagnosis not present

## 2019-12-15 DIAGNOSIS — I130 Hypertensive heart and chronic kidney disease with heart failure and stage 1 through stage 4 chronic kidney disease, or unspecified chronic kidney disease: Secondary | ICD-10-CM | POA: Diagnosis not present

## 2019-12-15 DIAGNOSIS — E1122 Type 2 diabetes mellitus with diabetic chronic kidney disease: Secondary | ICD-10-CM | POA: Diagnosis not present

## 2019-12-15 DIAGNOSIS — I509 Heart failure, unspecified: Secondary | ICD-10-CM | POA: Diagnosis not present

## 2019-12-15 DIAGNOSIS — M47816 Spondylosis without myelopathy or radiculopathy, lumbar region: Secondary | ICD-10-CM | POA: Diagnosis not present

## 2019-12-26 ENCOUNTER — Ambulatory Visit (INDEPENDENT_AMBULATORY_CARE_PROVIDER_SITE_OTHER): Payer: Medicare Other | Admitting: Otolaryngology

## 2020-02-06 ENCOUNTER — Ambulatory Visit: Payer: Medicare Other | Admitting: Family

## 2020-02-20 ENCOUNTER — Telehealth: Payer: Self-pay | Admitting: Family

## 2020-02-20 ENCOUNTER — Ambulatory Visit (INDEPENDENT_AMBULATORY_CARE_PROVIDER_SITE_OTHER): Payer: Medicare Other | Admitting: Family

## 2020-02-20 ENCOUNTER — Ambulatory Visit: Payer: Medicare Other | Attending: Internal Medicine

## 2020-02-20 ENCOUNTER — Other Ambulatory Visit (HOSPITAL_BASED_OUTPATIENT_CLINIC_OR_DEPARTMENT_OTHER): Payer: Self-pay | Admitting: Internal Medicine

## 2020-02-20 ENCOUNTER — Other Ambulatory Visit: Payer: Self-pay

## 2020-02-20 VITALS — BP 111/64 | HR 80 | Temp 97.8°F | Resp 16 | Ht 66.0 in | Wt 153.0 lb

## 2020-02-20 DIAGNOSIS — I10 Essential (primary) hypertension: Secondary | ICD-10-CM | POA: Diagnosis not present

## 2020-02-20 DIAGNOSIS — M1A9XX0 Chronic gout, unspecified, without tophus (tophi): Secondary | ICD-10-CM

## 2020-02-20 DIAGNOSIS — I2510 Atherosclerotic heart disease of native coronary artery without angina pectoris: Secondary | ICD-10-CM | POA: Diagnosis not present

## 2020-02-20 DIAGNOSIS — H6090 Unspecified otitis externa, unspecified ear: Secondary | ICD-10-CM

## 2020-02-20 DIAGNOSIS — M79641 Pain in right hand: Secondary | ICD-10-CM | POA: Diagnosis not present

## 2020-02-20 DIAGNOSIS — E782 Mixed hyperlipidemia: Secondary | ICD-10-CM

## 2020-02-20 DIAGNOSIS — E118 Type 2 diabetes mellitus with unspecified complications: Secondary | ICD-10-CM

## 2020-02-20 DIAGNOSIS — Z23 Encounter for immunization: Secondary | ICD-10-CM | POA: Diagnosis not present

## 2020-02-20 DIAGNOSIS — K219 Gastro-esophageal reflux disease without esophagitis: Secondary | ICD-10-CM

## 2020-02-20 MED ORDER — NEOMYCIN-POLYMYXIN-HC 1 % OT SOLN: 3.0000 [drp] | Freq: Four times a day (QID) | OTIC | 0 refills | Status: DC

## 2020-02-20 MED ORDER — NEOMYCIN-POLYMYXIN-HC 1 % OT SOLN: 3.0000 [drp] | Freq: Four times a day (QID) | OTIC | 0 refills | Status: AC

## 2020-02-20 NOTE — Telephone Encounter (Signed)
Patient states pharmacy does not have rx  NEOMYCIN-POLYMYXIN-HYDROCORTISONE (CORTISPORIN) 1 % SOLN OTIC solution [[id number]]  [id number] - [place], [place] - [address]  [address] [place] [place] [address]  Phone:  [phone] Fax:  [phone]

## 2020-02-20 NOTE — Patient Instructions (Addendum)
Please complete lab work prior to leaving. Stop HCTZ.   Please stop by the pharmacy and get your covid booster.  Please schedule a routine diabetic eye exam and ask them to send [place] a copy of your report.  Start ear drops for your right ear.

## 2020-02-20 NOTE — Progress Notes (Signed)
Subjective:    Patient ID: Javier [name], male    DOB: 1930-03-13, 86 y.o.   MRN: [id number]  HPI   Patient is an 86 yr old male who presents today for follow up.  DM2- reports good appetite, eating healthy.  Lab Results  Component Value Date   HGBA1C 5.6 03/14/2019   HGBA1C 6.0 12/13/2018   HGBA1C 6.0 04/18/2018   Lab Results  Component Value Date   MICROALBUR <0.7 12/25/2014   LDLCALC 70 03/14/2019   CREATININE 1.74 (H) 11/06/2019   C/o right wrist pain/finger stiffness.   HTN- maintained on amlodipine 5mg , lasix 20mg  , hctz 25mg , coreg 3.125.  BP Readings from Last 3 Encounters:  02/20/20 111/64  11/06/19 124/70  10/27/19 94/62   Hyperlipidemia- maintained on mevacor 40mg .  Lab Results  Component Value Date   CHOL 145 03/14/2019   HDL 62.70 03/14/2019   LDLCALC 70 03/14/2019   LDLDIRECT 53.0 12/25/2014   TRIG 59.0 03/14/2019   CHOLHDL 2 03/14/2019   Gout- uses colchicine 0.6mg  prn and allopurinol 100mg  once daily. No recent gout flare ups.  Wt Readings from Last 3 Encounters:  02/20/20 153 lb (69.4 kg)  11/06/19 150 lb (68 kg)  10/27/19 146 lb (66.2 kg)   C/o irritation in the right ear.   States that he has moved recently.  He is living with his son currently but will be renting a trailer that is close to his daughter and his son.    Review of Systems    see HPI  Past Medical History:  Diagnosis Date  . Atrial fibrillation (HCC)   . Bronchitis   . CAD (coronary artery disease)   . Chronic renal insufficiency   . Congestive heart failure (HCC)   . Diabetes mellitus type II   . GERD (gastroesophageal reflux disease)   . Gout   . Hyperlipidemia   . Hypertension   . Leg cramps   . Osteoarthritis   . Shingles      Social History   Socioeconomic History  . Marital status: Legally Separated    Spouse name: Not on file  . Number of children: Not on file  . Years of education: Not on file  . Highest education level: Not on file    Occupational History  . Not on file  Tobacco Use  . Smoking status: Former 03/16/2019  . Smokeless tobacco: Never Used  . Tobacco comment: quit 40 years ago-40 pack year history  Vaping Use  . Vaping Use: Never used  Substance and Sexual Activity  . Alcohol use: Yes    Comment: drinks liquor on occassion.  . Drug use: No  . Sexual activity: Not on file  Other Topics Concern  . Not on file  Social History Narrative   Last updated: 02/13/2010   Married but separated from wife   Alcohol use-yes   Former Smoker quit 40 yrs ago (40 pack yr history)     Works part time at [employer] near [place]   Social Determinants of Health   Financial Resource Strain:   . Difficulty of Paying Living Expenses: Not on file  Food Insecurity:   . Worried About 12/28/19 in the Last Year: Not on file  . Ran Out of Food in the Last Year: Not on file  Transportation Needs:   . Lack of Transportation (Medical): Not on file  . Lack of Transportation (Non-Medical): Not on file  Physical Activity:   .  Days of Exercise per Week: Not on file  . Minutes of Exercise per Session: Not on file  Stress:   . Feeling of Stress : Not on file  Social Connections:   . Frequency of Communication with Friends and Family: Not on file  . Frequency of Social Gatherings with Friends and Family: Not on file  . Attends Religious Services: Not on file  . Active Member of Clubs or Organizations: Not on file  . Attends Banker Meetings: Not on file  . Marital Status: Not on file  Intimate Partner Violence:   . Fear of Current or Ex-Partner: Not on file  . Emotionally Abused: Not on file  . Physically Abused: Not on file  . Sexually Abused: Not on file    Past Surgical History:  Procedure Laterality Date  . CARDIOVERSION  11/02/2005   s/p  . CORONARY ARTERY BYPASS GRAFT  05/18/2005  . EYE SURGERY     CATARACT SX 06/2015 both eyes Dr.Beavis per pt  . EYE SURGERY Bilateral  12/09/2016   revision of cataract surgery from 2017.  Marland Kitchen MITRAL VALVE REPAIR  04/2005   s/p mitral valve repair    Family History  Problem Relation Age of Onset  . Diabetes Other        siblings  . Melanoma Brother        died at 79    No Known Allergies  Current Outpatient Medications on File Prior to Visit  Medication Sig Dispense Refill  . allopurinol (ZYLOPRIM) 100 MG tablet NEW PRESCRIPTION REQUEST: TAKE ONE TABLET BY MOUTH DAILY 90 tablet 1  . amLODipine (NORVASC) 5 MG tablet NEW PRESCRIPTION REQUEST: TAKE ONE TABLET BY MOUTH DAILY 90 tablet 1  . aspirin 81 MG tablet Take 81 mg by mouth daily.      . ASPIRIN LOW DOSE 81 MG EC tablet NEW PRESCRIPTION REQUEST: TAKE ONE TABLET BY MOUTH DAILY 90 tablet 1  . betamethasone valerate (VALISONE) 0.1 % cream NEW PRESCRIPTION REQUEST: APPLY TOPICALLY TWICE DAILY TO AFFECTED AREA(S) TO RASH AS NEEDED FOR REDNESS AND FOR ITCHING 45 g 1  . carvedilol (COREG) 3.125 MG tablet NEW PRESCRIPTION REQUEST: TAKE ONE TABLET BY MOUTH TWICE DAILY 180 tablet 1  . colchicine 0.6 MG tablet NEW PRESCRIPTION REQUEST: TAKE TWO TABLETS BY MOUTH DAILY AT START OF GOUT SYMPTOMS AND THEN TAKE ONE TABLET BY MOUTH ONE HOUR LATER AS NEEDED 270 tablet 1  . folic acid (FOLVITE) 1 MG tablet NEW PRESCRIPTION REQUEST: TAKE ONE TABLET BY MOUTH DAILY 90 tablet 1  . furosemide (LASIX) 20 MG tablet NEW PRESCRIPTION REQUEST: TAKE ONE TABLET BY MOUTH DAILY 90 tablet 1  . hydrochlorothiazide (HYDRODIURIL) 25 MG tablet NEW PRESCRIPTION REQUEST: TAKE ONE TABLET BY MOUTH DAILY 90 tablet 1  . loratadine (CLARITIN) 10 MG tablet NEW PRESCRIPTION REQUEST: TAKE ONE TABLET BY MOUTH DAILY 90 tablet 1  . lovastatin (MEVACOR) 40 MG tablet NEW PRESCRIPTION REQUEST: TAKE ONE TABLET BY MOUTH DAILY 90 tablet 1  . Multiple Vitamins-Minerals (MULTIVITAMIN WITH MINERALS) tablet Take 1 tablet by mouth daily.    . Omega-3 Fatty Acids (FISH OIL) 1000 MG CAPS NEW PRESCRIPTION REQUEST: TAKE ONE CAPSULE  BY MOUTH TWICE DAILY 180 capsule 1  . potassium chloride (KLOR-CON) 10 MEQ tablet NEW PRESCRIPTION REQUEST: TAKE ONE TABLET BY MOUTH DAILY 90 tablet 1   No current facility-administered medications on file prior to visit.    BP 111/64 (BP Location: Right Arm, Patient Position: Sitting, Cuff Size: Small)  Pulse 80   Temp 97.8 F (36.6 C) (Oral)   Resp 16   Ht 5\' 6"  (1.676 m)   Wt 153 lb (69.4 kg)   SpO2 100%   BMI 24.69 kg/m    Objective:   Physical Exam Constitutional:      General: He is not in acute distress.    Appearance: He is well-developed.  HENT:     Head: Normocephalic and atraumatic.     Comments: Semi-dried blood noted in right ear canal. Canal appears erythematous.   R TM is normal Cardiovascular:     Rate and Rhythm: Normal rate and regular rhythm.     Heart sounds: No murmur heard.   Pulmonary:     Effort: Pulmonary effort is normal. No respiratory distress.     Breath sounds: Normal breath sounds. No wheezing or rales.  Skin:    General: Skin is warm and dry.  Neurological:     Mental Status: He is alert and oriented to person, place, and time.     Comments: Unable to full flex his right 3nd and 4rd + thenar wasting right hand  Psychiatric:        Behavior: Behavior normal.        Thought Content: Thought content normal.           Assessment & Plan:  Otitis externa- will re-treat with cortisporin otic.  DM2- clinically stable. Obtain follow up A1C.  HTN- bp appears overtreated. D/c hctz. Obtain cmet.   Hyperlipidemia- tolerating mevacor 40mg . Obtain follow up lipid panel.   R hand pain- refer to hand specialist for further evaluation.  Gout- no recent flares. Continue allopurinol 100mg  once daily and prn colchicine.  This visit occurred during the SARS-CoV-2 public health emergency.  Safety protocols were in place, including screening questions prior to the visit, additional usage of staff PPE, and extensive cleaning of exam room while  observing appropriate contact time as indicated for disinfecting solutions.

## 2020-02-20 NOTE — Progress Notes (Signed)
   Cov[facility] Vaccination [facility]  Name:  Javier [name]    MRN: [id number] DOB: 10/19/1929  02/20/2020  Mr. Javier [name] was observed post Covid-19 immunization for 15 minutes without incident. He was provided with Vaccine Information Sheet and instruction to access the V-Safe system.   Mr. Javier [name] was instructed to call 911 with any severe reactions post vaccine: Marland Kitchen Difficulty breathing  . Swelling of face and throat  . A fast heartbeat  . A bad rash all over body  . Dizziness and weakness

## 2020-02-21 LAB — COMPREHENSIVE METABOLIC PANEL
AG Ratio: 1.5 (calc) (ref 1.0–2.5)
ALT: 13 U/L (ref 9–46)
AST: 21 U/L (ref 10–35)
Albumin: 4.4 g/dL (ref 3.6–5.1)
Alkaline phosphatase (APISO): 73 U/L (ref 35–144)
BUN/Creatinine Ratio: 24 (calc) — ABNORMAL HIGH (ref 6–22)
BUN: 52 mg/dL — ABNORMAL HIGH (ref 7–25)
CO2: 26 mmol/L (ref 20–32)
Calcium: 9.9 mg/dL (ref 8.6–10.3)
Chloride: 103 mmol/L (ref 98–110)
Creat: 2.14 mg/dL — ABNORMAL HIGH (ref 0.70–1.11)
Globulin: 3 g/dL (calc) (ref 1.9–3.7)
Glucose, Bld: 114 mg/dL — ABNORMAL HIGH (ref 65–99)
Potassium: 4.2 mmol/L (ref 3.5–5.3)
Sodium: 141 mmol/L (ref 135–146)
Total Bilirubin: 0.8 mg/dL (ref 0.2–1.2)
Total Protein: 7.4 g/dL (ref 6.1–8.1)

## 2020-02-21 LAB — MICROALBUMIN / CREATININE URINE RATIO
Creatinine, Urine: 33 mg/dL (ref 20–320)
Microalb Creat Ratio: 9 mcg/mg creat (ref <30)
Microalb, Ur: 0.3 mg/dL

## 2020-02-21 LAB — LIPID PANEL
Cholesterol: 133 mg/dL (ref <200)
HDL: 64 mg/dL (ref >=40)
LDL Cholesterol (Calc): 53 mg/dL (calc)
Non-HDL Cholesterol (Calc): 69 mg/dL (calc) (ref <130)
Total CHOL/HDL Ratio: 2.1 (calc) (ref <5.0)
Triglycerides: 77 mg/dL (ref <150)

## 2020-02-21 LAB — HEMOGLOBIN A1C
Hgb A1c MFr Bld: 5.5 % of total Hgb (ref <5.7)
Mean Plasma Glucose: 111 (calc)
eAG (mmol/L): 6.2 (calc)

## 2020-02-21 NOTE — Telephone Encounter (Signed)
Medication was sent in yesterday

## 2020-02-29 IMAGING — DX CHEST - 2 VIEW
1 series · 1 of 1 positions shown · non-contrast
Comparison: CT scan dated 11/25/2018 and chest x-ray dated
11/16/2018

CLINICAL DATA: Follow-up of pneumonia.

EXAM:
CHEST - 2 VIEW

[chest pa]
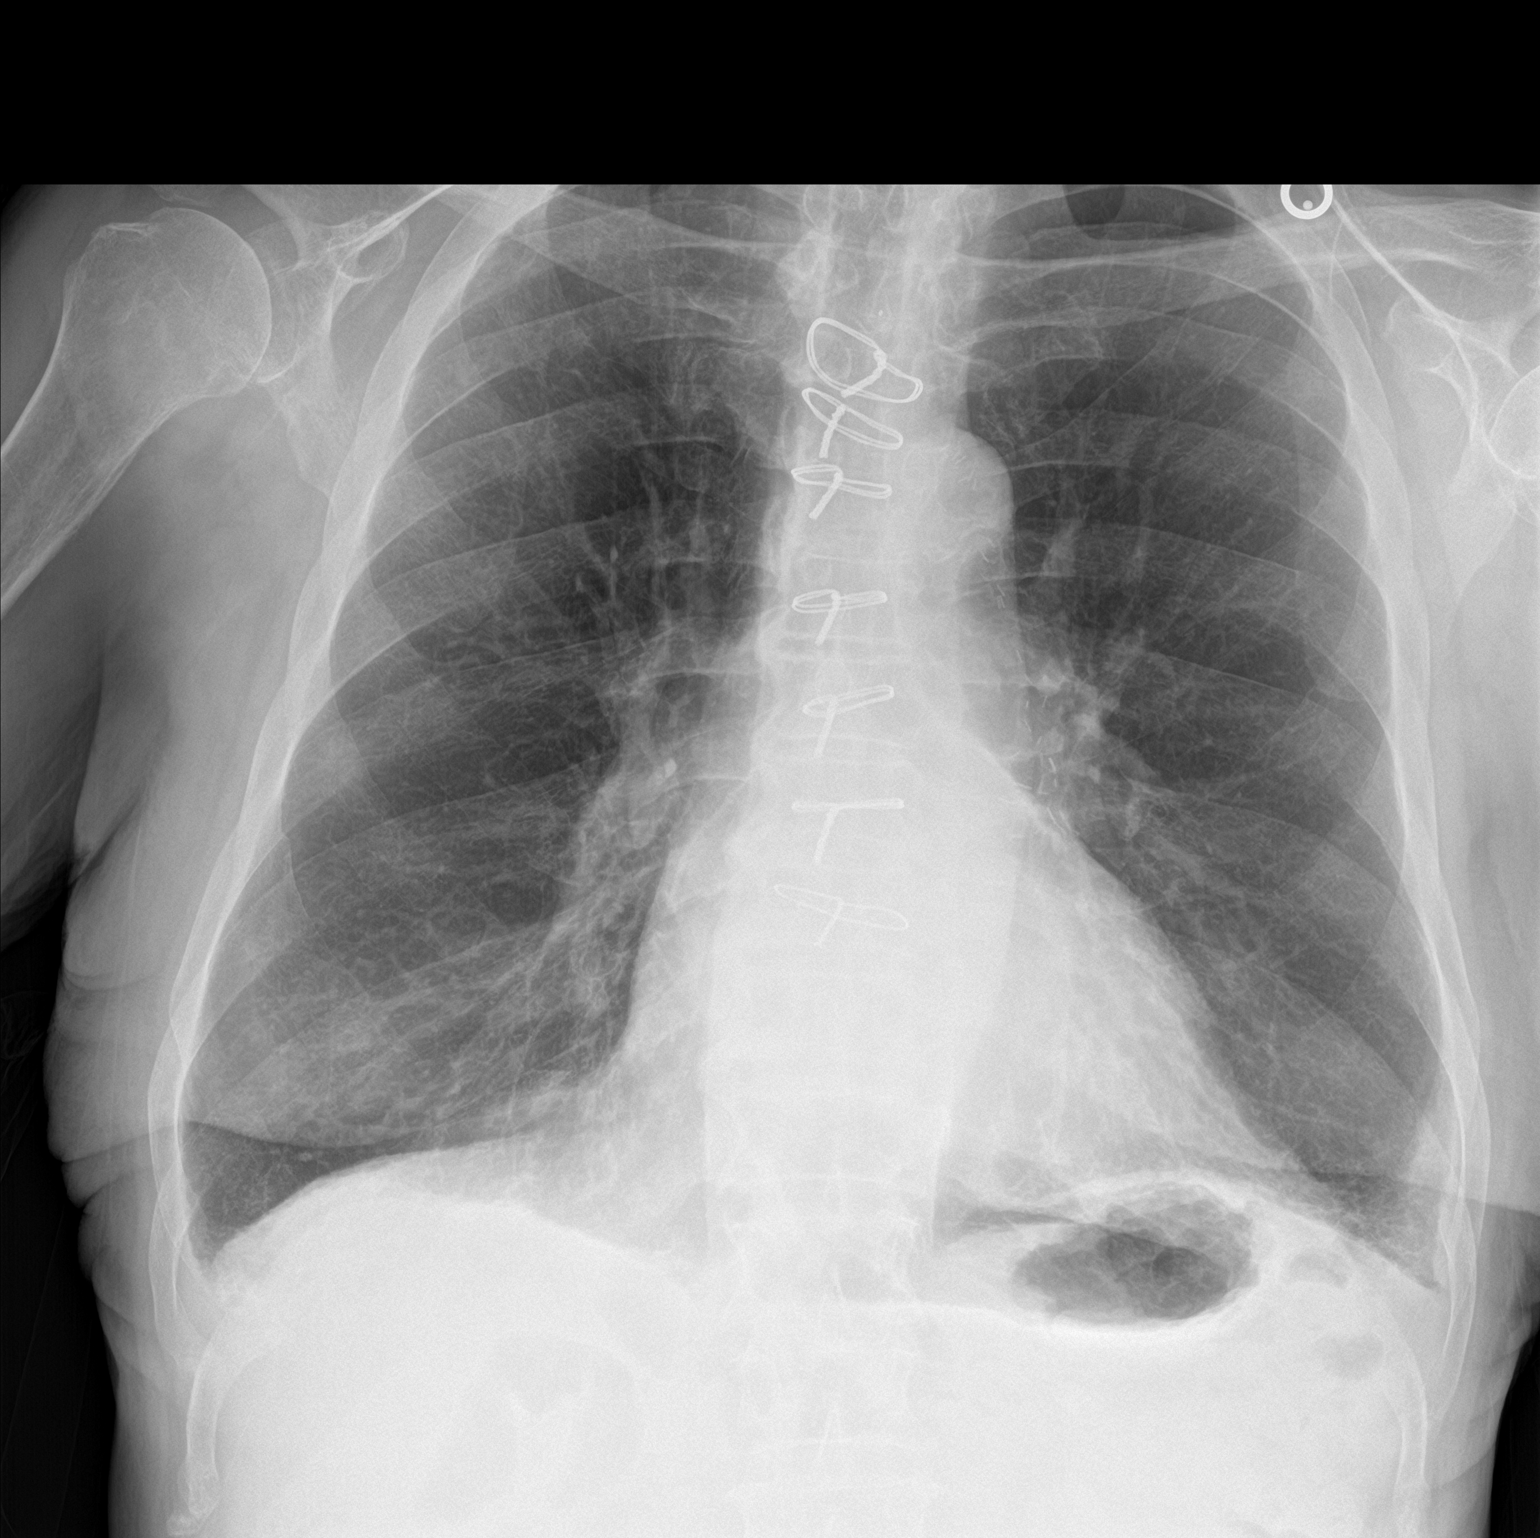

[1 of 1 positions shown; findings below may reference images not displayed]

FINDINGS: The heart size and pulmonary vascularity are normal. There is
chronic accentuation of the interstitial markings at both lung
bases. Minimal blunting of the right costophrenic angle laterally
and posteriorly which could represent a tiny effusion.

No significant bone abnormality.

CABG.
IMPRESSION: Possible tiny right effusion. Chronic accentuation of the
interstitial markings at the lung bases. No appreciable pneumonia.

## 2020-02-29 MED FILL — MODERNA COVID-19 VACCINE 10: 100 | 1 days supply | Qty: 0 | Fill #0

## 2020-03-05 ENCOUNTER — Encounter: Payer: Self-pay | Admitting: Family

## 2020-03-05 ENCOUNTER — Other Ambulatory Visit: Payer: Self-pay

## 2020-03-05 ENCOUNTER — Ambulatory Visit (INDEPENDENT_AMBULATORY_CARE_PROVIDER_SITE_OTHER): Payer: Medicare Other | Admitting: Family

## 2020-03-05 VITALS — BP 121/73 | HR 57 | Temp 97.8°F | Resp 16 | Ht 66.0 in | Wt 156.0 lb

## 2020-03-05 DIAGNOSIS — I2510 Atherosclerotic heart disease of native coronary artery without angina pectoris: Secondary | ICD-10-CM

## 2020-03-05 DIAGNOSIS — I10 Essential (primary) hypertension: Secondary | ICD-10-CM

## 2020-03-05 DIAGNOSIS — H6092 Unspecified otitis externa, left ear: Secondary | ICD-10-CM

## 2020-03-05 LAB — BASIC METABOLIC PANEL
BUN: 36 mg/dL — ABNORMAL HIGH (ref 6–23)
CO2: 30 mEq/L (ref 19–32)
Calcium: 9.1 mg/dL (ref 8.4–10.5)
Chloride: 105 mEq/L (ref 96–112)
Creatinine, Ser: 1.74 mg/dL — ABNORMAL HIGH (ref 0.40–1.50)
GFR: 34.64 mL/min — ABNORMAL LOW (ref >60.00)
Glucose, Bld: 97 mg/dL (ref 70–99)
Potassium: 4 mEq/L (ref 3.5–5.1)
Sodium: 141 mEq/L (ref 135–145)

## 2020-03-05 NOTE — Progress Notes (Signed)
Subjective:    Patient ID: Javier [name], male    DOB: [birth year], 86 y.o.   MRN: [id number]  HPI  Patient is an 86 yr old male who presents today for follow up.  left otitis externa- last visit we treated him with cortisporin otic. He reports that his symptoms are resolved.   HTN- bp was noted to be overtreated last visit. We discontinued hctz. He reports feeling well since he discontinued hctz 25mg . He denies LE edema.  BP Readings from Last 3 Encounters:  03/05/20 121/73  02/20/20 111/64  11/06/19 124/70   Right hand pain- has not seen hand specialist. Reports right hand pain is an ongoing issue for him.    Review of Systems See HPI  Past Medical History:  Diagnosis Date   Atrial fibrillation (HCC)    Bronchitis    CAD (coronary artery disease)    Chronic renal insufficiency    Congestive heart failure (HCC)    Diabetes mellitus type II    GERD (gastroesophageal reflux disease)    Gout    Hyperlipidemia    Hypertension    Leg cramps    Osteoarthritis    Shingles      Social History   Socioeconomic History   Marital status: Legally Separated    Spouse name: Not on file   Number of children: Not on file   Years of education: Not on file   Highest education level: Not on file  Occupational History   Not on file  Tobacco Use   Smoking status: Former Smoker   Smokeless tobacco: Never Used   Tobacco comment: quit 40 years ago-40 pack year history  Vaping Use   Vaping Use: Never used  Substance and Sexual Activity   Alcohol use: Yes    Comment: drinks liquor on occassion.   Drug use: No   Sexual activity: Not on file  Other Topics Concern   Not on file  Social History Narrative   Last updated: 02/13/2010   Married but separated from wife   Alcohol use-yes   Former Smoker quit 40 yrs ago (40 pack yr history)     Works part time at 02/15/2010 near [employer]   Social Determinants of [employer] Strain:    Difficulty of Paying Living Expenses: Not on file  Food Insecurity:    Worried About Research scientist (physical sciences) in the Last Year: Not on file   Programme researcher, broadcasting/film/video of Food in the Last Year: Not on file  Transportation Needs:    Lack of Transportation (Medical): Not on file   Lack of Transportation (Non-Medical): Not on file  Physical Activity:    Days of Exercise per Week: Not on file   Minutes of Exercise per Session: Not on file  Stress:    Feeling of Stress : Not on file  Social Connections:    Frequency of Communication with Friends and Family: Not on file   Frequency of Social Gatherings with Friends and Family: Not on file   Attends Religious Services: Not on file   Active Member of Clubs or Organizations: Not on file   Attends [employer] Meetings: Not on file   Marital Status: Not on file  Intimate Partner Violence:    Fear of Current or Ex-Partner: Not on file   Emotionally Abused: Not on file   Physically Abused: Not on file   Sexually Abused: Not on file    Past Surgical  History:  Procedure Laterality Date   CARDIOVERSION  11/02/2005   s/p   CORONARY ARTERY BYPASS GRAFT  05/18/2005   EYE SURGERY     CATARACT SX 06/2015 both eyes Dr.Beavis per pt   EYE SURGERY Bilateral 12/09/2016   revision of cataract surgery from 2017.   MITRAL VALVE REPAIR  04/2005   s/p mitral valve repair    Family History  Problem Relation Age of Onset   Diabetes Other        siblings   Melanoma Brother        died at 42    No Known Allergies  Current Outpatient Medications on File Prior to Visit  Medication Sig Dispense Refill   allopurinol (ZYLOPRIM) 100 MG tablet NEW PRESCRIPTION REQUEST: TAKE ONE TABLET BY MOUTH DAILY 90 tablet 1   amLODipine (NORVASC) 5 MG tablet NEW PRESCRIPTION REQUEST: TAKE ONE TABLET BY MOUTH DAILY 90 tablet 1   aspirin 81 MG tablet Take 81 mg by mouth daily.       ASPIRIN LOW DOSE 81 MG EC tablet NEW PRESCRIPTION  REQUEST: TAKE ONE TABLET BY MOUTH DAILY 90 tablet 1   betamethasone valerate (VALISONE) 0.1 % cream NEW PRESCRIPTION REQUEST: APPLY TOPICALLY TWICE DAILY TO AFFECTED AREA(S) TO RASH AS NEEDED FOR REDNESS AND FOR ITCHING 45 g 1   carvedilol (COREG) 3.125 MG tablet NEW PRESCRIPTION REQUEST: TAKE ONE TABLET BY MOUTH TWICE DAILY 180 tablet 1   colchicine 0.6 MG tablet NEW PRESCRIPTION REQUEST: TAKE TWO TABLETS BY MOUTH DAILY AT START OF GOUT SYMPTOMS AND THEN TAKE ONE TABLET BY MOUTH ONE HOUR LATER AS NEEDED 270 tablet 1   folic acid (FOLVITE) 1 MG tablet NEW PRESCRIPTION REQUEST: TAKE ONE TABLET BY MOUTH DAILY 90 tablet 1   furosemide (LASIX) 20 MG tablet NEW PRESCRIPTION REQUEST: TAKE ONE TABLET BY MOUTH DAILY 90 tablet 1   loratadine (CLARITIN) 10 MG tablet NEW PRESCRIPTION REQUEST: TAKE ONE TABLET BY MOUTH DAILY 90 tablet 1   lovastatin (MEVACOR) 40 MG tablet NEW PRESCRIPTION REQUEST: TAKE ONE TABLET BY MOUTH DAILY 90 tablet 1   Multiple Vitamins-Minerals (MULTIVITAMIN WITH MINERALS) tablet Take 1 tablet by mouth daily.     Omega-3 Fatty Acids (FISH OIL) 1000 MG CAPS NEW PRESCRIPTION REQUEST: TAKE ONE CAPSULE BY MOUTH TWICE DAILY 180 capsule 1   potassium chloride (KLOR-CON) 10 MEQ tablet NEW PRESCRIPTION REQUEST: TAKE ONE TABLET BY MOUTH DAILY 90 tablet 1   No current facility-administered medications on file prior to visit.    BP 121/73 (BP Location: Right Arm, Patient Position: Sitting, Cuff Size: Small)    Pulse (!) 57    Temp 97.8 F (36.6 C) (Oral)    Resp 16    Ht 5\' 6"  (1.676 m)    Wt 156 lb (70.8 kg)    SpO2 97%    BMI 25.18 kg/m       Objective:   Physical Exam Constitutional:      General: He is not in acute distress.    Appearance: He is well-developed.  HENT:     Head: Normocephalic and atraumatic.     Left Ear: Tympanic membrane and ear canal normal.  Cardiovascular:     Rate and Rhythm: Normal rate and regular rhythm.     Heart sounds: No murmur heard.    Pulmonary:     Effort: Pulmonary effort is normal. No respiratory distress.     Breath sounds: Normal breath sounds. No wheezing or rales.  Skin:  General: Skin is warm and dry.  Neurological:     Mental Status: He is alert and oriented to person, place, and time.  Psychiatric:        Behavior: Behavior normal.        Thought Content: Thought content normal.          Assessment & Plan:  HTN- bp looks good, pt appears euvolemic. Continue off of HCTZ. Obtain follow up bmet today.  Left Otitis externa- improved.  Monitor.  R hand pain-States he has not heard from the hand center. Referral note indicates that they tried to reach out to him unsuccessfully. I gave him their number to call to schedule an appointment.  This visit occurred during the SARS-CoV-2 public health emergency.  Safety protocols were in place, including screening questions prior to the visit, additional usage of staff PPE, and extensive cleaning of exam room while observing appropriate contact time as indicated for disinfecting solutions.

## 2020-03-05 NOTE — Patient Instructions (Signed)
Please complete lab work prior to leaving.   

## 2020-03-06 NOTE — Progress Notes (Signed)
Mailed out to patient 

## 2020-03-18 ENCOUNTER — Telehealth: Payer: Self-pay

## 2020-03-18 NOTE — Telephone Encounter (Signed)
We received returned mail today on the patient regarding his lab results.  I called the patient and got corrected address and also notified him during the call, per the results, his kidney function is stable/improved.  I did advise the patient I would be mailing the results back out to him now that I have the corrected address.

## 2020-03-27 ENCOUNTER — Telehealth: Payer: Self-pay | Admitting: Family

## 2020-03-27 NOTE — Telephone Encounter (Signed)
error 

## 2020-06-11 ENCOUNTER — Encounter: Payer: Self-pay | Admitting: Family

## 2020-06-11 ENCOUNTER — Encounter: Payer: Self-pay | Admitting: *Deleted

## 2020-06-11 ENCOUNTER — Other Ambulatory Visit: Payer: Self-pay

## 2020-06-11 ENCOUNTER — Ambulatory Visit (INDEPENDENT_AMBULATORY_CARE_PROVIDER_SITE_OTHER): Payer: Medicare Other | Admitting: Family

## 2020-06-11 VITALS — BP 120/61 | HR 64 | Temp 97.8°F | Resp 16 | Ht 66.0 in | Wt 153.0 lb

## 2020-06-11 DIAGNOSIS — E118 Type 2 diabetes mellitus with unspecified complications: Secondary | ICD-10-CM | POA: Diagnosis not present

## 2020-06-11 DIAGNOSIS — M1A9XX0 Chronic gout, unspecified, without tophus (tophi): Secondary | ICD-10-CM

## 2020-06-11 DIAGNOSIS — H6091 Unspecified otitis externa, right ear: Secondary | ICD-10-CM

## 2020-06-11 DIAGNOSIS — I10 Essential (primary) hypertension: Secondary | ICD-10-CM | POA: Diagnosis not present

## 2020-06-11 LAB — BASIC METABOLIC PANEL
BUN: 43 mg/dL — ABNORMAL HIGH (ref 6–23)
CO2: 30 mEq/L (ref 19–32)
Calcium: 10 mg/dL (ref 8.4–10.5)
Chloride: 102 mEq/L (ref 96–112)
Creatinine, Ser: 1.81 mg/dL — ABNORMAL HIGH (ref 0.40–1.50)
GFR: 32.97 mL/min — ABNORMAL LOW (ref >60.00)
Glucose, Bld: 83 mg/dL (ref 70–99)
Potassium: 3.8 mEq/L (ref 3.5–5.1)
Sodium: 141 mEq/L (ref 135–145)

## 2020-06-11 MED ORDER — POTASSIUM CHLORIDE ER 10 MEQ PO TBCR: EXTENDED_RELEASE_TABLET | ORAL | 1 refills | Status: DC

## 2020-06-11 MED ORDER — LOVASTATIN 40 MG PO TABS: 40.0000 mg | ORAL_TABLET | Freq: Every day | ORAL | 1 refills | Status: DC

## 2020-06-11 MED ORDER — NEOMYCIN-POLYMYXIN-HC 3.5-10000-1 OT SOLN: 3.0000 [drp] | Freq: Four times a day (QID) | OTIC | 1 refills | Status: AC

## 2020-06-11 MED ORDER — ALLOPURINOL 100 MG PO TABS: ORAL_TABLET | ORAL | 1 refills | Status: DC

## 2020-06-11 MED ORDER — CARVEDILOL 3.125 MG PO TABS: ORAL_TABLET | ORAL | 1 refills | Status: DC

## 2020-06-11 MED ORDER — AMLODIPINE BESYLATE 5 MG PO TABS: ORAL_TABLET | ORAL | 1 refills | Status: DC

## 2020-06-11 NOTE — Progress Notes (Signed)
Subjective:    Patient ID: Javier [name], male    DOB: 22-May-1929, 87 y.o.   MRN: [id number]  HPI   Patient is an 87 year old male who presents today for follow-up.  He is accompanied by his good friend with whom he lives.  His friend confides in me after the visit that the patient is requiring a lot of assistance.  He has a arthritis in his hands which makes it difficult for him to do tasks such as buttoning his pants and opening up soda cans. His friend who accompanies him today notes that the patient is preparing to buy a new car, but he fears that he is not safe to drive.  States he is able to bring patient wherever he needs to go.   HTN-current medications include Coreg 3.125 mg p.o. twice daily and amlodipine 5 mg p.o. daily. BP Readings from Last 3 Encounters:  06/11/20 120/61  03/05/20 121/73  02/20/20 111/64   Gout- reports that he had a flare last week.  Used colchicine. Uses allopurinol every day.   Hyperlipidemia-patient is maintained on lovastatin 40 mg p.o. daily Lab Results  Component Value Date   CHOL 133 02/20/2020   HDL 64 02/20/2020   LDLCALC 53 02/20/2020   LDLDIRECT 53.0 12/25/2014   TRIG 77 02/20/2020   CHOLHDL 2.1 02/20/2020   DM2-this is diet controlled. Lab Results  Component Value Date   HGBA1C 5.5 02/20/2020   HGBA1C 5.6 03/14/2019   HGBA1C 6.0 12/13/2018   Lab Results  Component Value Date   MICROALBUR 0.3 02/20/2020   LDLCALC 53 02/20/2020   CREATININE 1.74 (H) 03/05/2020   Otitis externa- some bloody drainage right ear.  He has seen ENT previously.  Patient is requesting a refill on his eardrops to use as needed.    Review of Systems See HPI  Past Medical History:  Diagnosis Date  . Atrial fibrillation (HCC)   . Bronchitis   . CAD (coronary artery disease)   . Chronic renal insufficiency   . Congestive heart failure (HCC)   . Diabetes mellitus type II   . GERD (gastroesophageal reflux disease)   . Gout   . Hyperlipidemia   .  Hypertension   . Leg cramps   . Osteoarthritis   . Shingles      Social History   Socioeconomic History  . Marital status: Legally Separated    Spouse name: Not on file  . Number of children: Not on file  . Years of education: Not on file  . Highest education level: Not on file  Occupational History  . Not on file  Tobacco Use  . Smoking status: Former Games developer  . Smokeless tobacco: Never Used  . Tobacco comment: quit 40 years ago-40 pack year history  Vaping Use  . Vaping Use: Never used  Substance and Sexual Activity  . Alcohol use: Yes    Comment: drinks liquor on occassion.  . Drug use: No  . Sexual activity: Not on file  Other Topics Concern  . Not on file  Social History Narrative   Last updated: 02/13/2010   Married but separated from wife   Alcohol use-yes   Former Smoker quit 40 yrs ago (40 pack yr history)     Works part time at [employer] near [employer]   Social Determinants of Health   Financial Resource Strain: Not on file  Food Insecurity: Not on file  Transportation Needs: Not on file  Physical Activity:  Not on file  Stress: Not on file  Social Connections: Not on file  Intimate Partner Violence: Not on file    Past Surgical History:  Procedure Laterality Date  . CARDIOVERSION  11/02/2005   s/p  . CORONARY ARTERY BYPASS GRAFT  05/18/2005  . EYE SURGERY     CATARACT SX 06/2015 both eyes Dr.Beavis per pt  . EYE SURGERY Bilateral 12/09/2016   revision of cataract surgery from 2017.  Marland Kitchen MITRAL VALVE REPAIR  04/2005   s/p mitral valve repair    Family History  Problem Relation Age of Onset  . Diabetes Other        siblings  . Melanoma Brother        died at 72    No Known Allergies  Current Outpatient Medications on File Prior to Visit  Medication Sig Dispense Refill  . aspirin 81 MG tablet Take 81 mg by mouth daily.    . ASPIRIN LOW DOSE 81 MG EC tablet NEW PRESCRIPTION REQUEST: TAKE ONE TABLET BY MOUTH DAILY 90 tablet 1  .  betamethasone valerate (VALISONE) 0.1 % cream NEW PRESCRIPTION REQUEST: APPLY TOPICALLY TWICE DAILY TO AFFECTED AREA(S) TO RASH AS NEEDED FOR REDNESS AND FOR ITCHING 45 g 1  . colchicine 0.6 MG tablet NEW PRESCRIPTION REQUEST: TAKE TWO TABLETS BY MOUTH DAILY AT START OF GOUT SYMPTOMS AND THEN TAKE ONE TABLET BY MOUTH ONE HOUR LATER AS NEEDED 270 tablet 1  . folic acid (FOLVITE) 1 MG tablet NEW PRESCRIPTION REQUEST: TAKE ONE TABLET BY MOUTH DAILY 90 tablet 1  . furosemide (LASIX) 20 MG tablet NEW PRESCRIPTION REQUEST: TAKE ONE TABLET BY MOUTH DAILY 90 tablet 1  . loratadine (CLARITIN) 10 MG tablet NEW PRESCRIPTION REQUEST: TAKE ONE TABLET BY MOUTH DAILY 90 tablet 1  . Multiple Vitamins-Minerals (MULTIVITAMIN WITH MINERALS) tablet Take 1 tablet by mouth daily.    . Omega-3 Fatty Acids (FISH OIL) 1000 MG CAPS NEW PRESCRIPTION REQUEST: TAKE ONE CAPSULE BY MOUTH TWICE DAILY 180 capsule 1   No current facility-administered medications on file prior to visit.    BP 120/61 (BP Location: Right Arm, Patient Position: Sitting, Cuff Size: Small)   Pulse 64   Temp 97.8 F (36.6 C) (Oral)   Resp 16   Ht 5\' 6"  (1.676 m)   Wt 153 lb (69.4 kg)   SpO2 100%   BMI 24.69 kg/m       Objective:   Physical Exam Constitutional:      General: He is not in acute distress.    Appearance: He is well-developed and well-nourished.  HENT:     Head: Normocephalic and atraumatic.     Ears:     Comments: Some irritation of the right ear canal is noted with scant bloody drainage and erythema.  Small amount of white discharge noted in either canal. Cardiovascular:     Rate and Rhythm: Normal rate and regular rhythm.     Heart sounds: No murmur heard.   Pulmonary:     Effort: Pulmonary effort is normal. No respiratory distress.     Breath sounds: Normal breath sounds. No wheezing or rales.  Musculoskeletal:        General: No edema.  Skin:    General: Skin is warm and dry.  Neurological:     Mental Status:  He is alert and oriented to person, place, and time.  Psychiatric:        Mood and Affect: Mood and affect normal.  Behavior: Behavior normal.        Thought Content: Thought content normal.           Assessment & Plan:  Hypertension-blood pressure is stable.  Continue Coreg 3.125 mg p.o. twice daily and amlodipine 5 mg p.o. daily.  Gout-patient had a recent flare which resolved with as needed use of colchicine.  We will continue daily allopurinol 100 mg.  Diabetes type 2-clinically stable.  Obtain follow-up A1c.  Otitis externa-prescription given for Corticosporin otic drops to be used 4 times daily for 5 days as needed for recurrent right-sided otitis externa.  Due to concern about pt's ability to safely operate a vehicle, will initiate evaluation through the DMV.  This visit occurred during the SARS-CoV-2 public health emergency.  Safety protocols were in place, including screening questions prior to the visit, additional usage of staff PPE, and extensive cleaning of exam room while observing appropriate contact time as indicated for disinfecting solutions.

## 2020-06-13 NOTE — Progress Notes (Signed)
Mailed out to pt 

## 2020-06-25 ENCOUNTER — Telehealth: Payer: Self-pay | Admitting: Family

## 2020-06-25 ENCOUNTER — Other Ambulatory Visit: Payer: Self-pay | Admitting: Family

## 2020-06-25 NOTE — Telephone Encounter (Signed)
Please advise pt refills sent, except HCTZ which was previously discontinued due to low blood pressure.

## 2020-06-25 NOTE — Telephone Encounter (Signed)
Patient advised rx was sent to the pharmacy except for the HCTZ. Patient verbalized understanding.

## 2020-07-09 ENCOUNTER — Other Ambulatory Visit: Payer: Self-pay | Admitting: Family

## 2020-07-11 ENCOUNTER — Other Ambulatory Visit: Payer: Self-pay | Admitting: Family

## 2020-07-15 ENCOUNTER — Other Ambulatory Visit: Payer: Self-pay | Admitting: Family

## 2020-08-09 ENCOUNTER — Other Ambulatory Visit: Payer: Self-pay | Admitting: Family

## 2020-09-10 ENCOUNTER — Ambulatory Visit (INDEPENDENT_AMBULATORY_CARE_PROVIDER_SITE_OTHER): Payer: Medicare Other | Admitting: Family

## 2020-09-10 ENCOUNTER — Other Ambulatory Visit: Payer: Self-pay

## 2020-09-10 VITALS — BP 114/61 | HR 74 | Temp 98.0°F | Resp 16 | Ht 66.0 in | Wt 167.0 lb

## 2020-09-10 DIAGNOSIS — M1A9XX0 Chronic gout, unspecified, without tophus (tophi): Secondary | ICD-10-CM

## 2020-09-10 DIAGNOSIS — E782 Mixed hyperlipidemia: Secondary | ICD-10-CM

## 2020-09-10 DIAGNOSIS — E118 Type 2 diabetes mellitus with unspecified complications: Secondary | ICD-10-CM | POA: Diagnosis not present

## 2020-09-10 DIAGNOSIS — I5032 Chronic diastolic (congestive) heart failure: Secondary | ICD-10-CM

## 2020-09-10 DIAGNOSIS — I10 Essential (primary) hypertension: Secondary | ICD-10-CM | POA: Diagnosis not present

## 2020-09-10 LAB — COMPREHENSIVE METABOLIC PANEL
ALT: 7 U/L (ref 0–53)
AST: 15 U/L (ref 0–37)
Albumin: 4.2 g/dL (ref 3.5–5.2)
Alkaline Phosphatase: 75 U/L (ref 39–117)
BUN: 32 mg/dL — ABNORMAL HIGH (ref 6–23)
CO2: 28 mEq/L (ref 19–32)
Calcium: 9.3 mg/dL (ref 8.4–10.5)
Chloride: 108 mEq/L (ref 96–112)
Creatinine, Ser: 1.75 mg/dL — ABNORMAL HIGH (ref 0.40–1.50)
GFR: 34.27 mL/min — ABNORMAL LOW (ref >60.00)
Glucose, Bld: 97 mg/dL (ref 70–99)
Potassium: 4.8 mEq/L (ref 3.5–5.1)
Sodium: 145 mEq/L (ref 135–145)
Total Bilirubin: 0.9 mg/dL (ref 0.2–1.2)
Total Protein: 7 g/dL (ref 6.0–8.3)

## 2020-09-10 LAB — LIPID PANEL
Cholesterol: 125 mg/dL (ref 0–200)
HDL: 53.9 mg/dL (ref >39.00)
LDL Cholesterol: 58 mg/dL (ref 0–99)
NonHDL: 70.7
Total CHOL/HDL Ratio: 2
Triglycerides: 63 mg/dL (ref 0.0–149.0)
VLDL: 12.6 mg/dL (ref 0.0–40.0)

## 2020-09-10 LAB — HEMOGLOBIN A1C: Hgb A1c MFr Bld: 5.3 % (ref 4.6–6.5)

## 2020-09-10 NOTE — Assessment & Plan Note (Signed)
Pt appears euvolemic. Monitor.

## 2020-09-10 NOTE — Assessment & Plan Note (Signed)
Diet controlled. Will obtain A1C.

## 2020-09-10 NOTE — Patient Instructions (Signed)
Please complete lab work prior to leaving.   

## 2020-09-10 NOTE — Assessment & Plan Note (Signed)
Stable, continue allopurinol 100mg  once daily.

## 2020-09-10 NOTE — Assessment & Plan Note (Signed)
BP stable. Continue amlodipine 5mg , carvedilol 3.125mg , hctz 25 mg, lasix 20mg .

## 2020-09-10 NOTE — Assessment & Plan Note (Signed)
Tolerating mevacor 40mg , obtain follow up lipid panel.

## 2020-09-10 NOTE — Progress Notes (Signed)
Subjective:    Patient ID: Javier [name], male    DOB: [birth year], 87 y.o.   MRN: [id number]  HPI  Patient is an 87 yr old male who presents today for follow up.  HTN- maintained on amlodipine 5mg , carvedilol 3.125mg , hctz 25 mg, lasix 20mg . He is no longer working but enjoys at the .  Now living with his son.   BP Readings from Last 3 Encounters:  09/10/20 114/61  06/11/20 120/61  03/05/20 121/73   DM2- diet controlled. Reports that his diet is good.   Lab Results  Component Value Date   HGBA1C 5.5 02/20/2020   HGBA1C 5.6 03/14/2019   HGBA1C 6.0 12/13/2018   Lab Results  Component Value Date   MICROALBUR 0.3 02/20/2020   LDLCALC 53 02/20/2020   CREATININE 1.81 (H) 06/11/2020   Hyperlipidemia-  Lab Results  Component Value Date   CHOL 133 02/20/2020   HDL 64 02/20/2020   LDLCALC 53 02/20/2020   LDLDIRECT 53.0 12/25/2014   TRIG 77 02/20/2020   CHOLHDL 2.1 02/20/2020   Maintained on mevacor 40mg .   Gout- reports that he has not had any recent gout flares. He continues allopurinol 100mg  once daily.   Review of Systems    see HPI  Past Medical History:  Diagnosis Date  . Atrial fibrillation (HCC)   . Bronchitis   . CAD (coronary artery disease)   . Chronic renal insufficiency   . Congestive heart failure (HCC)   . Diabetes mellitus type II   . GERD (gastroesophageal reflux disease)   . Gout   . Hyperlipidemia   . Hypertension   . Leg cramps   . Osteoarthritis   . Shingles      Social History   Socioeconomic History  . Marital status: Legally Separated    Spouse name: Not on file  . Number of children: Not on file  . Years of education: Not on file  . Highest education level: Not on file  Occupational History  . Not on file  Tobacco Use  . Smoking status: Former 13/05/2019  . Smokeless tobacco: Never Used  . Tobacco comment: quit 40 years ago-40 pack year history  Vaping Use  . Vaping Use: Never used  Substance and  Sexual Activity  . Alcohol use: Yes    Comment: drinks liquor on occassion.  . Drug use: No  . Sexual activity: Not on file  Other Topics Concern  . Not on file  Social History Narrative   Last updated: 02/13/2010   Married but separated from wife   Alcohol use-yes   Former Smoker quit 40 yrs ago (40 pack yr history)     Works part time at near   Social Determinants of Games developer Strain: Not on file  Food Insecurity: Not on file  Transportation Needs: Not on file  Physical Activity: Not on file  Stress: Not on file  Social Connections: Not on file  Intimate Partner Violence: Not on file    Past Surgical History:  Procedure Laterality Date  . CARDIOVERSION  11/02/2005   s/p  . CORONARY ARTERY BYPASS GRAFT  05/18/2005  . EYE SURGERY     CATARACT SX 06/2015 both eyes Dr.Beavis per pt  . EYE SURGERY Bilateral 12/09/2016   revision of cataract surgery from 2017.  05/20/2005 MITRAL VALVE REPAIR  04/2005   s/p mitral valve repair    Family History  Problem  Relation Age of Onset  . Diabetes Other        siblings  . Melanoma Brother        died at 19    No Known Allergies  Current Outpatient Medications on File Prior to Visit  Medication Sig Dispense Refill  . allopurinol (ZYLOPRIM) 100 MG tablet TAKE ONE TABLET BY MOUTH IN THE MORNING 90 tablet 1  . amLODipine (NORVASC) 5 MG tablet TAKE ONE TABLET BY MOUTH IN THE MORNING 90 tablet 1  . aspirin 81 MG tablet Take 81 mg by mouth daily.    . ASPIRIN LOW DOSE 81 MG EC tablet TAKE ONE TABLET BY MOUTH IN THE MORNING 90 tablet 1  . betamethasone valerate (VALISONE) 0.1 % cream APPLY TO THE AFFECTED AREA(S) TOPICALLY DAILY AS NEEDED FOR ITCHING OR REDNESS 45 g 1  . carvedilol (COREG) 3.125 MG tablet TAKE ONE TABLET BY MOUTH IN THE MORNING AND IN THE EVENING 180 tablet 1  . colchicine 0.6 MG tablet TAKE TWO TABLETS BY MOUTH DAILY (AT START OF GOUT SYMPTOMS) and THEN TAKE ONE TABLET BY  MOUTH ONE HOUR LATER AS NEEDED 15 tablet 1  . COVID-19 mRNA vaccine, Moderna, 100 MCG/0.5ML injection INJECT AS DIRECTED .25 mL 0  . folic acid (FOLVITE) 1 MG tablet TAKE ONE TABLET BY MOUTH IN THE MORNING 90 tablet 1  . furosemide (LASIX) 20 MG tablet TAKE ONE TABLET BY MOUTH IN THE MORNING 90 tablet 1  . hydrochlorothiazide (HYDRODIURIL) 25 MG tablet TAKE ONE TABLET BY MOUTH IN THE MORNING 90 tablet 1  . loratadine (CLARITIN) 10 MG tablet TAKE ONE TABLET BY MOUTH IN THE MORNING 90 tablet 1  . lovastatin (MEVACOR) 40 MG tablet TAKE ONE TABLET BY MOUTH IN THE MORNING 90 tablet 1  . Multiple Vitamins-Minerals (MULTIVITAMIN WITH MINERALS) tablet Take 1 tablet by mouth daily.    Marland Kitchen omega-3 fish oil (MAXEPA) 1000 MG CAPS capsule TAKE ONE CAPSULE BY MOUTH IN THE MORNING AND IN THE EVENING 180 capsule 1  . potassium chloride (KLOR-CON) 10 MEQ tablet TAKE ONE TABLET BY MOUTH IN THE MORNING 90 tablet 1   No current facility-administered medications on file prior to visit.    BP 114/61 (BP Location: Right Arm, Patient Position: Sitting, Cuff Size: Small)   Pulse 74   Temp 98 F (36.7 C) (Oral)   Resp 16   Ht 5\' 6"  (1.676 m)   Wt 167 lb (75.8 kg)   SpO2 98%   BMI 26.95 kg/m    Objective:   Physical Exam Constitutional:      General: He is not in acute distress.    Appearance: He is well-developed.  HENT:     Head: Normocephalic and atraumatic.  Cardiovascular:     Rate and Rhythm: Normal rate and regular rhythm.     Heart sounds: No murmur heard.   Pulmonary:     Effort: Pulmonary effort is normal. No respiratory distress.     Breath sounds: Normal breath sounds. No wheezing or rales.  Skin:    General: Skin is warm and dry.  Neurological:     Mental Status: He is alert and oriented to person, place, and time.  Psychiatric:        Behavior: Behavior normal.        Thought Content: Thought content normal.           Assessment & Plan:

## 2020-09-11 ENCOUNTER — Encounter: Payer: Self-pay | Admitting: Family

## 2020-09-13 NOTE — Progress Notes (Signed)
Mailed out to pt 

## 2020-09-18 ENCOUNTER — Other Ambulatory Visit: Payer: Self-pay | Admitting: Family

## 2020-09-19 ENCOUNTER — Other Ambulatory Visit: Payer: Self-pay | Admitting: Family

## 2020-10-15 ENCOUNTER — Other Ambulatory Visit: Payer: Self-pay | Admitting: Family

## 2020-12-03 ENCOUNTER — Other Ambulatory Visit: Payer: Self-pay | Admitting: Family

## 2020-12-11 ENCOUNTER — Ambulatory Visit (INDEPENDENT_AMBULATORY_CARE_PROVIDER_SITE_OTHER): Payer: Medicare Other | Admitting: Family

## 2020-12-11 ENCOUNTER — Encounter: Payer: Self-pay | Admitting: Family

## 2020-12-11 ENCOUNTER — Other Ambulatory Visit: Payer: Self-pay

## 2020-12-11 DIAGNOSIS — I10 Essential (primary) hypertension: Secondary | ICD-10-CM

## 2020-12-11 DIAGNOSIS — E118 Type 2 diabetes mellitus with unspecified complications: Secondary | ICD-10-CM | POA: Diagnosis not present

## 2020-12-11 DIAGNOSIS — M1A9XX0 Chronic gout, unspecified, without tophus (tophi): Secondary | ICD-10-CM | POA: Diagnosis not present

## 2020-12-11 DIAGNOSIS — I5032 Chronic diastolic (congestive) heart failure: Secondary | ICD-10-CM

## 2020-12-11 LAB — BASIC METABOLIC PANEL
BUN: 38 mg/dL — ABNORMAL HIGH (ref 6–23)
CO2: 26 mEq/L (ref 19–32)
Calcium: 9.6 mg/dL (ref 8.4–10.5)
Chloride: 102 mEq/L (ref 96–112)
Creatinine, Ser: 1.78 mg/dL — ABNORMAL HIGH (ref 0.40–1.50)
GFR: 33.52 mL/min — ABNORMAL LOW (ref >60.00)
Glucose, Bld: 86 mg/dL (ref 70–99)
Potassium: 4.1 mEq/L (ref 3.5–5.1)
Sodium: 139 mEq/L (ref 135–145)

## 2020-12-11 LAB — HEMOGLOBIN A1C: Hgb A1c MFr Bld: 5.7 % (ref 4.6–6.5)

## 2020-12-11 NOTE — Patient Instructions (Addendum)
Stop HCTZ. You can call the pharmacy to ask them which pill this is in your pack so you can pull it out.  [phone] Complete lab work prior to leaving.

## 2020-12-11 NOTE — Assessment & Plan Note (Signed)
Lab Results  Component Value Date   HGBA1C 5.3 09/10/2020   HGBA1C 5.5 02/20/2020   HGBA1C 5.6 03/14/2019   Lab Results  Component Value Date   MICROALBUR 0.3 02/20/2020   LDLCALC 58 09/10/2020   CREATININE 1.75 (H) 09/10/2020  stable with DM diet.

## 2020-12-11 NOTE — Progress Notes (Signed)
Subjective:   By signing my name below, I, Shehryar Baig, attest that this documentation has been prepared under the direction and in the presence of Sandford Craze NP. 12/11/2020    Patient ID: Javier [name], male    DOB: 11/09/1929, 87 y.o.   MRN: [id number]  Chief Complaint  Patient presents with   Hypertension    Here for follow up   Diabetes    Here for follow up    HPI Patient is in today for a office visit.  Gout- He reports having no recent episodes of flare ups. He continues taking 100 mg allopurinol daily PO and reports no new issues while taking it.  Blood pressure- His blood pressure is slightly decreased during this visit. He continue taking 5 mg amlodipine daily PO, 20 mg lasix daily PO, 3.125 mg carvedilol daily PO, 25 mg hydrochlorothiazide daily PO and reports no new issues while taking them.   BP Readings from Last 3 Encounters:  12/11/20 (!) 89/58  09/10/20 114/61  06/11/20 120/61   Pulse Readings from Last 3 Encounters:  12/11/20 67  09/10/20 74  06/11/20 64    Health Maintenance Due  Topic Date Due   Zoster Vaccines- Shingrix (1 of 2) Never done   COVID-19 Vaccine (4 - Booster for Moderna series) 06/19/2020   INFLUENZA VACCINE  11/18/2020    Past Medical History:  Diagnosis Date   Atrial fibrillation (HCC)    Bronchitis    CAD (coronary artery disease)    Chronic renal insufficiency    Congestive heart failure (HCC)    Diabetes mellitus type II    GERD (gastroesophageal reflux disease)    Gout    Hyperlipidemia    Hypertension    Leg cramps    Osteoarthritis    Shingles     Past Surgical History:  Procedure Laterality Date   CARDIOVERSION  11/02/2005   s/p   CORONARY ARTERY BYPASS GRAFT  05/18/2005   EYE SURGERY     CATARACT SX 06/2015 both eyes Dr.Beavis per pt   EYE SURGERY Bilateral 12/09/2016   revision of cataract surgery from 2017.   MITRAL VALVE REPAIR  04/2005   s/p mitral valve repair    Family History  Problem  Relation Age of Onset   Diabetes Other        siblings   Melanoma Brother        died at 33    Social History   Socioeconomic History   Marital status: Legally Separated    Spouse name: Not on file   Number of children: Not on file   Years of education: Not on file   Highest education level: Not on file  Occupational History   Not on file  Tobacco Use   Smoking status: Former   Smokeless tobacco: Never   Tobacco comments:    quit 40 years ago-40 pack year history  Vaping Use   Vaping Use: Never used  Substance and Sexual Activity   Alcohol use: Yes    Comment: drinks liquor on occassion.   Drug use: No   Sexual activity: Not on file  Other Topics Concern   Not on file  Social History Narrative   Last updated: 02/13/2010   Married but separated from wife   Alcohol use-yes   Former Smoker quit 40 yrs ago (40 pack yr history)     Works part time at [employer] near [employer]   Social Determinants of Health  Financial Resource Strain: Not on file  Food Insecurity: Not on file  Transportation Needs: Not on file  Physical Activity: Not on file  Stress: Not on file  Social Connections: Not on file  Intimate Partner Violence: Not on file    Outpatient Medications Prior to Visit  Medication Sig Dispense Refill   allopurinol (ZYLOPRIM) 100 MG tablet TAKE ONE TABLET BY MOUTH IN THE MORNING 90 tablet 1   amLODipine (NORVASC) 5 MG tablet TAKE ONE TABLET BY MOUTH IN THE MORNING 90 tablet 1   ASPIRIN LOW DOSE 81 MG EC tablet TAKE ONE TABLET BY MOUTH IN THE MORNING 90 tablet 1   betamethasone valerate (VALISONE) 0.1 % cream APPLY TO AFFECTED AREA(S) TOPICALLY DAILY AS NEEDED FOR ITCHING OR REDNESS 45 g 1   carvedilol (COREG) 3.125 MG tablet TAKE ONE TABLET BY MOUTH IN THE MORNING AND IN THE EVENING 180 tablet 1   colchicine 0.6 MG tablet TAKE TWO TABLETS BY MOUTH DAILY (AT THE START OF GOUT SYMPTOMS) and THEN TAKE ONE TABLET BY MOUTH 1 HOUR LATER AS NEEDED 15  tablet 1   folic acid (FOLVITE) 1 MG tablet TAKE ONE TABLET BY MOUTH IN THE MORNING 90 tablet 1   furosemide (LASIX) 20 MG tablet TAKE ONE TABLET BY MOUTH IN THE MORNING 90 tablet 1   loratadine (CLARITIN) 10 MG tablet TAKE ONE TABLET BY MOUTH IN THE MORNING 90 tablet 1   lovastatin (MEVACOR) 40 MG tablet TAKE ONE TABLET BY MOUTH IN THE MORNING 90 tablet 1   Multiple Vitamins-Minerals (MULTIVITAMIN WITH MINERALS) tablet Take 1 tablet by mouth daily.     omega-3 fish oil (MAXEPA) 1000 MG CAPS capsule TAKE ONE CAPSULE BY MOUTH IN THE MORNING AND IN THE EVENING 180 capsule 1   potassium chloride (KLOR-CON) 10 MEQ tablet TAKE ONE TABLET BY MOUTH IN THE MORNING 90 tablet 1   hydrochlorothiazide (HYDRODIURIL) 25 MG tablet TAKE ONE TABLET BY MOUTH IN THE MORNING 90 tablet 1   No facility-administered medications prior to visit.    No Known Allergies  ROS    See HPI  Objective:    Physical Exam Constitutional:      General: He is not in acute distress.    Appearance: Normal appearance. He is not ill-appearing.  HENT:     Head: Normocephalic and atraumatic.     Right Ear: External ear normal.     Left Ear: External ear normal.  Eyes:     Extraocular Movements: Extraocular movements intact.     Pupils: Pupils are equal, round, and reactive to light.  Cardiovascular:     Rate and Rhythm: Normal rate and regular rhythm.     Heart sounds: Normal heart sounds. No murmur heard.   No gallop.     Comments: His blood pressure measured 85/45 during the physical exam Pulmonary:     Effort: Pulmonary effort is normal. No respiratory distress.     Breath sounds: Normal breath sounds. No wheezing or rales.  Skin:    General: Skin is warm and dry.  Neurological:     Mental Status: He is alert and oriented to person, place, and time.  Psychiatric:        Behavior: Behavior normal.    BP (!) 89/58 (BP Location: Right Arm, Patient Position: Sitting, Cuff Size: Small)   Pulse 67   Temp 98.6 F  (37 C) (Oral)   Resp 16   Wt 155 lb (70.3 kg)   SpO2 100%  BMI 25.02 kg/m  Wt Readings from Last 3 Encounters:  12/11/20 155 lb (70.3 kg)  09/10/20 167 lb (75.8 kg)  06/11/20 153 lb (69.4 kg)       Assessment & Plan:   Problem List Items Addressed This Visit       Unprioritized   Gout    Reports stable on allopurinol.        Essential hypertension    BP Readings from Last 3 Encounters:  12/11/20 (!) 89/58  09/10/20 114/61  06/11/20 120/61  BP is low.  Advised pt to d/c hctz and follow up in 1 week for bp recheck.       Diabetes mellitus type 2, controlled, with complications [facility])    Lab Results  Component Value Date   HGBA1C 5.3 09/10/2020   HGBA1C 5.5 02/20/2020   HGBA1C 5.6 03/14/2019   Lab Results  Component Value Date   MICROALBUR 0.3 02/20/2020   LDLCALC 58 09/10/2020   CREATININE 1.75 (H) 09/10/2020  stable with DM diet.        Relevant Orders   Hemoglobin A1c   Basic metabolic panel   Chronic diastolic CHF (congestive heart failure) (HCC)    Pt is clinically euvolemic. Continue lasix 20mg  once daily.         No orders of the defined types were placed in this encounter.   I, NP, personally preformed the services described in this documentation.  All medical record entries made by the scribe were at my direction and in my presence.  I have reviewed the chart and discharge instructions (if applicable) and agree that the record reflects my personal performance and is accurate and complete. 12/11/2020   I,Shehryar Baig,acting as a 12/13/2020 for Neurosurgeon, NP.,have documented all relevant documentation on the behalf of Lemont Fillers, NP,as directed by  Lemont Fillers, NP while in the presence of Lemont Fillers, NP.   Lemont Fillers, NP

## 2020-12-11 NOTE — Assessment & Plan Note (Signed)
Reports stable on allopurinol.

## 2020-12-11 NOTE — Assessment & Plan Note (Addendum)
BP Readings from Last 3 Encounters:  12/11/20 (!) 89/58  09/10/20 114/61  06/11/20 120/61   BP is low.  Advised pt to d/c hctz and follow up in 1 week for bp recheck.

## 2020-12-11 NOTE — Assessment & Plan Note (Signed)
Pt is clinically euvolemic. Continue lasix 20mg  once daily.

## 2020-12-12 NOTE — Progress Notes (Signed)
Mailed out to patient 

## 2020-12-17 ENCOUNTER — Other Ambulatory Visit: Payer: Self-pay | Admitting: Family

## 2020-12-18 ENCOUNTER — Ambulatory Visit (INDEPENDENT_AMBULATORY_CARE_PROVIDER_SITE_OTHER): Payer: Medicare Other | Admitting: Family

## 2020-12-18 ENCOUNTER — Other Ambulatory Visit: Payer: Self-pay

## 2020-12-18 VITALS — BP 99/66 | HR 84 | Temp 97.9°F | Resp 16 | Wt 158.0 lb

## 2020-12-18 DIAGNOSIS — I10 Essential (primary) hypertension: Secondary | ICD-10-CM

## 2020-12-18 DIAGNOSIS — Z23 Encounter for immunization: Secondary | ICD-10-CM

## 2020-12-18 NOTE — Patient Instructions (Signed)
Stop amlodipine 5 mg   

## 2020-12-18 NOTE — Assessment & Plan Note (Addendum)
BP Readings from Last 3 Encounters:  12/18/20 99/66  12/11/20 (!) 89/58  09/10/20 114/61   Wt Readings from Last 3 Encounters:  12/18/20 158 lb (71.7 kg)  12/11/20 155 lb (70.3 kg)  09/10/20 167 lb (75.8 kg)   Improved but still on the low side. No clinical sign of volume overload. Will also d/c amlodipine 5mg . Continue carvedilol 3.125mg  bid.

## 2020-12-18 NOTE — Progress Notes (Signed)
Subjective:   By signing my name below, I, Shehryar Baig, attest that this documentation has been prepared under the direction and in the presence of Sandford Craze NP. 12/18/2020    Patient ID: Javier [name], male    DOB: 1929-09-01, 87 y.o.   MRN: [id number]  Chief Complaint  Patient presents with   Hypertension    Here for one week follow up    HPI Patient is in today for a office visit.  Blood pressure- His blood pressure was low during last visit and he stopped taking 25 mg hydrochlorothiazide daily PO and reports no new issues after stopping it. His blood pressure is low during this visit as well and he is willing to reducing his medication to raise it. He continues taking 5 mg amlodipine daily PO, 20 mg lasix daily PO, 3.125 carvedilol daily PO and reports no new issues while taking it.   BP Readings from Last 3 Encounters:  12/18/20 99/66  12/11/20 (!) 89/58  09/10/20 114/61   Pulse Readings from Last 3 Encounters:  12/18/20 84  12/11/20 67  09/10/20 74   Immunizations- He is willing to get the flu vaccine during this visit.   Health Maintenance Due  Topic Date Due   Zoster Vaccines- Shingrix (1 of 2) Never done   COVID-19 Vaccine (4 - Booster for Moderna series) 06/19/2020   INFLUENZA VACCINE  11/18/2020    Past Medical History:  Diagnosis Date   Atrial fibrillation (HCC)    Bronchitis    CAD (coronary artery disease)    Chronic renal insufficiency    Congestive heart failure (HCC)    Diabetes mellitus type II    GERD (gastroesophageal reflux disease)    Gout    Hyperlipidemia    Hypertension    Leg cramps    Osteoarthritis    Shingles     Past Surgical History:  Procedure Laterality Date   CARDIOVERSION  11/02/2005   s/p   CORONARY ARTERY BYPASS GRAFT  05/18/2005   EYE SURGERY     CATARACT SX 06/2015 both eyes Dr.Beavis per pt   EYE SURGERY Bilateral 12/09/2016   revision of cataract surgery from 2017.   MITRAL VALVE REPAIR  04/2005   s/p  mitral valve repair    Family History  Problem Relation Age of Onset   Diabetes Other        siblings   Melanoma Brother        died at 73    Social History   Socioeconomic History   Marital status: Legally Separated    Spouse name: Not on file   Number of children: Not on file   Years of education: Not on file   Highest education level: Not on file  Occupational History   Not on file  Tobacco Use   Smoking status: Former   Smokeless tobacco: Never   Tobacco comments:    quit 40 years ago-40 pack year history  Vaping Use   Vaping Use: Never used  Substance and Sexual Activity   Alcohol use: Yes    Comment: drinks liquor on occassion.   Drug use: No   Sexual activity: Not on file  Other Topics Concern   Not on file  Social History Narrative   Last updated: 02/13/2010   Married but separated from wife   Alcohol use-yes   Former Smoker quit 40 yrs ago (40 pack yr history)     Works part time at [employer]  near [facility]   Social Determinants of Health   Financial Resource Strain: Not on file  Food Insecurity: Not on file  Transportation Needs: Not on file  Physical Activity: Not on file  Stress: Not on file  Social Connections: Not on file  Intimate Partner Violence: Not on file    Outpatient Medications Prior to Visit  Medication Sig Dispense Refill   allopurinol (ZYLOPRIM) 100 MG tablet TAKE ONE TABLET BY MOUTH IN THE MORNING 90 tablet 1   ASPIRIN LOW DOSE 81 MG EC tablet TAKE ONE TABLET BY MOUTH IN THE MORNING 90 tablet 1   betamethasone valerate (VALISONE) 0.1 % cream APPLY TO AFFECTED AREA(S) TOPICALLY DAILY AS NEEDED FOR ITCHING OR REDNESS 45 g 1   carvedilol (COREG) 3.125 MG tablet TAKE ONE TABLET BY MOUTH IN THE MORNING AND IN THE EVENING 180 tablet 1   colchicine 0.6 MG tablet TAKE TWO TABLETS BY MOUTH DAILY (AT THE START OF GOUT SYMPTOMS) and THEN TAKE ONE TABLET BY MOUTH 1 HOUR LATER AS NEEDED 15 tablet 1   folic acid (FOLVITE) 1 MG  tablet TAKE ONE TABLET BY MOUTH IN THE MORNING 90 tablet 1   furosemide (LASIX) 20 MG tablet TAKE ONE TABLET BY MOUTH IN THE MORNING 90 tablet 1   loratadine (CLARITIN) 10 MG tablet TAKE ONE TABLET BY MOUTH IN THE MORNING 90 tablet 1   lovastatin (MEVACOR) 40 MG tablet TAKE ONE TABLET BY MOUTH IN THE MORNING 90 tablet 1   Multiple Vitamins-Minerals (MULTIVITAMIN WITH MINERALS) tablet Take 1 tablet by mouth daily.     omega-3 fish oil (MAXEPA) 1000 MG CAPS capsule TAKE ONE CAPSULE BY MOUTH IN THE MORNING AND IN THE EVENING 180 capsule 1   potassium chloride (KLOR-CON) 10 MEQ tablet TAKE ONE TABLET BY MOUTH IN THE MORNING 90 tablet 1   amLODipine (NORVASC) 5 MG tablet TAKE ONE TABLET BY MOUTH IN THE MORNING 90 tablet 1   No facility-administered medications prior to visit.    No Known Allergies  ROS  See HPI     Objective:    Physical Exam Constitutional:      General: He is not in acute distress.    Appearance: Normal appearance. He is not ill-appearing.  HENT:     Head: Normocephalic and atraumatic.     Right Ear: External ear normal.     Left Ear: External ear normal.  Eyes:     Extraocular Movements: Extraocular movements intact.     Pupils: Pupils are equal, round, and reactive to light.  Cardiovascular:     Rate and Rhythm: Normal rate and regular rhythm.     Heart sounds: Normal heart sounds. No murmur heard.   No gallop.  Pulmonary:     Effort: Pulmonary effort is normal. No respiratory distress.     Breath sounds: Normal breath sounds. No wheezing or rales.  Skin:    General: Skin is warm and dry.  Neurological:     Mental Status: He is alert and oriented to person, place, and time.  Psychiatric:        Behavior: Behavior normal.    BP 99/66 (BP Location: Right Arm, Patient Position: Sitting, Cuff Size: Small)   Pulse 84   Temp 97.9 F (36.6 C) (Oral)   Resp 16   Wt 158 lb (71.7 kg)   SpO2 98%   BMI 25.50 kg/m  Wt Readings from Last 3 Encounters:   12/18/20 158 lb (71.7 kg)  12/11/20 155  lb (70.3 kg)  09/10/20 167 lb (75.8 kg)       Assessment & Plan:   Problem List Items Addressed This Visit       Unprioritized   Essential hypertension    BP Readings from Last 3 Encounters:  12/18/20 99/66  12/11/20 (!) 89/58  09/10/20 114/61   Wt Readings from Last 3 Encounters:  12/18/20 158 lb (71.7 kg)  12/11/20 155 lb (70.3 kg)  09/10/20 167 lb (75.8 kg)  Improved but still on the low side. No clinical sign of volume overload. Will also d/c amlodipine 5mg . Continue carvedilol 3.125mg  bid.        Flu shot today.    No orders of the defined types were placed in this encounter.   I, NP, personally preformed the services described in this documentation.  All medical record entries made by the scribe were at my direction and in my presence.  I have reviewed the chart and discharge instructions (if applicable) and agree that the record reflects my personal performance and is accurate and complete. 12/18/2020   I,Shehryar Baig,acting as a 12/20/2020 for Neurosurgeon, NP.,have documented all relevant documentation on the behalf of Lemont Fillers, NP,as directed by  Lemont Fillers, NP while in the presence of Lemont Fillers, NP.   Lemont Fillers, NP

## 2021-01-01 ENCOUNTER — Other Ambulatory Visit: Payer: Self-pay

## 2021-01-01 ENCOUNTER — Ambulatory Visit (INDEPENDENT_AMBULATORY_CARE_PROVIDER_SITE_OTHER): Payer: Medicare Other | Admitting: Family

## 2021-01-01 VITALS — BP 118/60 | HR 61 | Temp 97.0°F | Resp 16 | Wt 159.0 lb

## 2021-01-01 DIAGNOSIS — I10 Essential (primary) hypertension: Secondary | ICD-10-CM

## 2021-01-01 DIAGNOSIS — L989 Disorder of the skin and subcutaneous tissue, unspecified: Secondary | ICD-10-CM | POA: Diagnosis not present

## 2021-01-01 NOTE — Assessment & Plan Note (Signed)
New. Concerning for skin cancer. Will refer to dermatology for further evaluation.

## 2021-01-01 NOTE — Progress Notes (Signed)
Subjective:   By signing my name below, I, Shehryar Baig, attest that this documentation has been prepared under the direction and in the presence of Sandford Craze NP. 01/01/2021    Patient ID: Javier [name], male    DOB: Jul 08, 1929, 87 y.o.   MRN: [id number]  Chief Complaint  Patient presents with   Hypertension    Follow up     Hypertension  Patient is in today for a office visit.  Blood pressure- His blood pressure is doing well since he switched his medication. He is taking 3.125 mg 2x daily PO and reports no new issues while taking it.   BP Readings from Last 3 Encounters:  01/01/21 118/60  12/18/20 99/66  12/11/20 (!) 89/58   Pulse Readings from Last 3 Encounters:  01/01/21 61  12/18/20 84  12/11/20 67    Health Maintenance Due  Topic Date Due   Zoster Vaccines- Shingrix (1 of 2) Never done   COVID-19 Vaccine (4 - Booster for Moderna series) 06/19/2020   URINE MICROALBUMIN  02/19/2021    Past Medical History:  Diagnosis Date   Atrial fibrillation (HCC)    Bronchitis    CAD (coronary artery disease)    Chronic renal insufficiency    Congestive heart failure (HCC)    Diabetes mellitus type II    GERD (gastroesophageal reflux disease)    Gout    Hyperlipidemia    Hypertension    Leg cramps    Osteoarthritis    Shingles     Past Surgical History:  Procedure Laterality Date   CARDIOVERSION  11/02/2005   s/p   CORONARY ARTERY BYPASS GRAFT  05/18/2005   EYE SURGERY     CATARACT SX 06/2015 both eyes Dr.Beavis per pt   EYE SURGERY Bilateral 12/09/2016   revision of cataract surgery from 2017.   MITRAL VALVE REPAIR  04/2005   s/p mitral valve repair    Family History  Problem Relation Age of Onset   Diabetes Other        siblings   Melanoma Brother        died at 27    Social History   Socioeconomic History   Marital status: Legally Separated    Spouse name: Not on file   Number of children: Not on file   Years of education: Not on file    Highest education level: Not on file  Occupational History   Not on file  Tobacco Use   Smoking status: Former   Smokeless tobacco: Never   Tobacco comments:    quit 40 years ago-40 pack year history  Vaping Use   Vaping Use: Never used  Substance and Sexual Activity   Alcohol use: Yes    Comment: drinks liquor on occassion.   Drug use: No   Sexual activity: Not on file  Other Topics Concern   Not on file  Social History Narrative   Last updated: 02/13/2010   Married but separated from wife   Alcohol use-yes   Former Smoker quit 40 yrs ago (40 pack yr history)     Works part time at [employer] near [employer]   Social Determinants of Corporate investment banker Strain: Not on file  Food Insecurity: Not on file  Transportation Needs: Not on file  Physical Activity: Not on file  Stress: Not on file  Social Connections: Not on file  Intimate Partner Violence: Not on file    Outpatient Medications Prior  to Visit  Medication Sig Dispense Refill   allopurinol (ZYLOPRIM) 100 MG tablet TAKE ONE TABLET BY MOUTH IN THE MORNING 90 tablet 1   ASPIRIN LOW DOSE 81 MG EC tablet TAKE ONE TABLET BY MOUTH IN THE MORNING 90 tablet 1   betamethasone valerate (VALISONE) 0.1 % cream APPLY TO AFFECTED AREA(S) TOPICALLY DAILY AS NEEDED FOR ITCHING OR REDNESS 45 g 1   carvedilol (COREG) 3.125 MG tablet TAKE ONE TABLET BY MOUTH IN THE MORNING AND IN THE EVENING 180 tablet 1   colchicine 0.6 MG tablet TAKE TWO TABLETS BY MOUTH DAILY (AT THE START OF GOUT SYMPTOMS) and THEN TAKE ONE TABLET BY MOUTH 1 HOUR LATER AS NEEDED 15 tablet 1   folic acid (FOLVITE) 1 MG tablet TAKE ONE TABLET BY MOUTH IN THE MORNING 90 tablet 1   furosemide (LASIX) 20 MG tablet TAKE ONE TABLET BY MOUTH IN THE MORNING 90 tablet 1   loratadine (CLARITIN) 10 MG tablet TAKE ONE TABLET BY MOUTH IN THE MORNING 90 tablet 1   lovastatin (MEVACOR) 40 MG tablet TAKE ONE TABLET BY MOUTH IN THE MORNING 90 tablet 1    Multiple Vitamins-Minerals (MULTIVITAMIN WITH MINERALS) tablet Take 1 tablet by mouth daily.     omega-3 fish oil (MAXEPA) 1000 MG CAPS capsule TAKE ONE CAPSULE BY MOUTH IN THE MORNING AND IN THE EVENING 180 capsule 1   potassium chloride (KLOR-CON) 10 MEQ tablet TAKE ONE TABLET BY MOUTH IN THE MORNING 90 tablet 1   No facility-administered medications prior to visit.    No Known Allergies  ROS     Objective:    Physical Exam Constitutional:      General: He is not in acute distress.    Appearance: Normal appearance. He is not ill-appearing.  HENT:     Head: Normocephalic and atraumatic.     Right Ear: External ear normal.     Left Ear: External ear normal.  Eyes:     Extraocular Movements: Extraocular movements intact.     Pupils: Pupils are equal, round, and reactive to light.  Cardiovascular:     Rate and Rhythm: Normal rate and regular rhythm.     Heart sounds: Normal heart sounds. No murmur heard.   No gallop.  Pulmonary:     Effort: Pulmonary effort is normal. No respiratory distress.     Breath sounds: Normal breath sounds. No wheezing or rales.  Skin:    General: Skin is warm and dry.     Comments: 2 honey colored lesions noted on distal nose tip.   Neurological:     Mental Status: He is alert and oriented to person, place, and time.  Psychiatric:        Behavior: Behavior normal.    BP 118/60 (BP Location: Right Arm, Patient Position: Sitting, Cuff Size: Small)   Pulse 61   Temp (!) 97 F (36.1 C) (Temporal)   Resp 16   Wt 159 lb (72.1 kg)   SpO2 100%   BMI 25.66 kg/m  Wt Readings from Last 3 Encounters:  01/01/21 159 lb (72.1 kg)  12/18/20 158 lb (71.7 kg)  12/11/20 155 lb (70.3 kg)       Assessment & Plan:   Problem List Items Addressed This Visit       Unprioritized   Skin lesion - Primary    New. Concerning for skin cancer. Will refer to dermatology for further evaluation.       Relevant Orders   Ambulatory  referral to Dermatology    Essential hypertension    BP Readings from Last 3 Encounters:  01/01/21 118/60  12/18/20 99/66  12/11/20 (!) 89/58  BP is improved off of amlodipine. Will continue off of amlodipine. Continue carvedilol 3.125mg  bid.         No orders of the defined types were placed in this encounter.   I, Sandford Craze NP, personally preformed the services described in this documentation.  All medical record entries made by the scribe were at my direction and in my presence.  I have reviewed the chart and discharge instructions (if applicable) and agree that the record reflects my personal performance and is accurate and complete. 01/01/2021   I,Shehryar Baig,acting as a scribe for Lemont Fillers, NP.,have documented all relevant documentation on the behalf of Lemont Fillers, NP,as directed by  Lemont Fillers, NP while in the presence of Lemont Fillers, NP.   Lemont Fillers, NP

## 2021-01-01 NOTE — Assessment & Plan Note (Addendum)
BP Readings from Last 3 Encounters:  01/01/21 118/60  12/18/20 99/66  12/11/20 (!) 89/58   BP is improved off of amlodipine. Will continue off of amlodipine. Continue carvedilol 3.125mg  bid.

## 2021-01-14 ENCOUNTER — Other Ambulatory Visit: Payer: Self-pay | Admitting: Family

## 2021-01-15 ENCOUNTER — Other Ambulatory Visit: Payer: Self-pay | Admitting: Family

## 2021-01-20 ENCOUNTER — Other Ambulatory Visit: Payer: Self-pay | Admitting: Family

## 2021-01-21 ENCOUNTER — Telehealth: Payer: Self-pay | Admitting: Family

## 2021-01-21 NOTE — Telephone Encounter (Signed)
I attempted to leave message for patient to call back and schedule Medicare Annual Wellness Visit (AWV) in office. No voice mail.  If not able to come in office, please offer to do virtually or by telephone.  Left office number and my jabber [phone].  Last AWV:04/07/2016  Please schedule at anytime with Nurse Health Advisor.

## 2021-01-28 ENCOUNTER — Ambulatory Visit (INDEPENDENT_AMBULATORY_CARE_PROVIDER_SITE_OTHER): Payer: Medicare Other

## 2021-01-28 ENCOUNTER — Telehealth: Payer: Self-pay

## 2021-01-28 VITALS — Ht 66.0 in | Wt 159.0 lb

## 2021-01-28 DIAGNOSIS — Z0000 Encounter for general adult medical examination without abnormal findings: Secondary | ICD-10-CM | POA: Diagnosis not present

## 2021-01-28 NOTE — Telephone Encounter (Signed)
Patient is requesting a prescription for xanax. He states he needs them for sleep. He states he has ask for a script in the past & was told no. He states he is currently taking 1 pill every night to help him sleep. States he buys them from someone & he pays $90 for 30 pills. He insisted that a message be sent to PCP to ask for a prescription. States " I am already taking them & plan to keep taking them but it would save me some money if I could get a prescription".

## 2021-01-28 NOTE — Telephone Encounter (Signed)
Please advise pt that I am sorry he is spending so much money but I don't think it is safe for him to take xanax at his age and he should discontinue.

## 2021-01-28 NOTE — Progress Notes (Signed)
Subjective:   Javier [name] is a 87 y.o. male who presents for Medicare Annual/Subsequent preventive examination.  I connected with Scotti today by telephone and verified that I am speaking with the correct person using two identifiers. Location patient: home Location provider: work Persons participating in the virtual visit: patient, Engineer, civil (consulting).    I discussed the limitations, risks, security and privacy concerns of performing an evaluation and management service by telephone and the availability of in person appointments. I also discussed with the patient that there may be a patient responsible charge related to this service. The patient expressed understanding and verbally consented to this telephonic visit.    Interactive audio and video telecommunications were attempted between this provider and patient, however failed, due to patient having technical difficulties OR patient did not have access to video capability.  We continued and completed visit with audio only.  Some vital signs may be absent or patient reported.   Time Spent with patient on telephone encounter: 40 minutes   Review of Systems     Cardiac Risk Factors include: advanced age (>67men, >64 women);male gender;diabetes mellitus;dyslipidemia;hypertension     Objective:    Today's Vitals   01/28/21 1142  Weight: 159 lb (72.1 kg)  Height: 5\' 6"  (1.676 m)   Body mass index is 25.66 kg/m.  Advanced Directives 01/28/2021 10/06/2019 04/07/2016  Does Patient Have a Medical Advance Directive? No No No  Would patient like information on creating a medical advance directive? No - Patient declined No - Patient declined No - Patient declined    Current Medications (verified) Outpatient Encounter Medications as of 01/28/2021  Medication Sig   allopurinol (ZYLOPRIM) 100 MG tablet TAKE ONE TABLET BY MOUTH IN THE MORNING   ASPIRIN LOW DOSE 81 MG EC tablet TAKE ONE TABLET BY MOUTH IN THE MORNING   betamethasone valerate  (VALISONE) 0.1 % cream APPLY TO AFFECTED AREA(S) TOPICALLY DAILY AS NEEDED FOR ITCHING OR REDNESS   carvedilol (COREG) 3.125 MG tablet TAKE ONE TABLET BY MOUTH IN THE MORNING AND IN THE EVENING   colchicine 0.6 MG tablet TAKE TWO TABLETS BY MOUTH DAILY (AT THE START OF GOUT SYMPTOMS) AND THEN TAKE ONE TABLET BY MOUTH ONE HOUR LATER AS NEEDED   folic acid (FOLVITE) 1 MG tablet TAKE ONE TABLET BY MOUTH IN THE MORNING   furosemide (LASIX) 20 MG tablet TAKE ONE TABLET BY MOUTH IN THE MORNING   loratadine (CLARITIN) 10 MG tablet TAKE ONE TABLET BY MOUTH IN THE MORNING   lovastatin (MEVACOR) 40 MG tablet TAKE ONE TABLET BY MOUTH IN THE MORNING   Multiple Vitamins-Minerals (MULTIVITAMIN WITH MINERALS) tablet Take 1 tablet by mouth daily.   omega-3 fish oil (MAXEPA) 1000 MG CAPS capsule TAKE ONE CAPSULE BY MOUTH IN THE MORNING AND IN THE EVENING   potassium chloride (KLOR-CON) 10 MEQ tablet TAKE ONE TABLET BY MOUTH IN THE MORNING   No facility-administered encounter medications on file as of 01/28/2021.    Allergies (verified) Patient has no known allergies.   History: Past Medical History:  Diagnosis Date   Atrial fibrillation (HCC)    Bronchitis    CAD (coronary artery disease)    Chronic renal insufficiency    Congestive heart failure (HCC)    Diabetes mellitus type II    GERD (gastroesophageal reflux disease)    Gout    Hyperlipidemia    Hypertension    Leg cramps    Osteoarthritis    Shingles    Past Surgical  History:  Procedure Laterality Date   CARDIOVERSION  11/02/2005   s/p   CORONARY ARTERY BYPASS GRAFT  05/18/2005   EYE SURGERY     CATARACT SX 06/2015 both eyes Dr.Beavis per pt   EYE SURGERY Bilateral 12/09/2016   revision of cataract surgery from 2017.   MITRAL VALVE REPAIR  04/2005   s/p mitral valve repair   Family History  Problem Relation Age of Onset   Diabetes Other        siblings   Melanoma Brother        died at 59   Social History   Socioeconomic  History   Marital status: Legally Separated    Spouse name: Not on file   Number of children: Not on file   Years of education: Not on file   Highest education level: Not on file  Occupational History   Not on file  Tobacco Use   Smoking status: Former   Smokeless tobacco: Never   Tobacco comments:    quit 40 years ago-40 pack year history  Vaping Use   Vaping Use: Never used  Substance and Sexual Activity   Alcohol use: Yes    Comment: 1 beer daily   Drug use: Yes    Types: Benzodiazepines    Comment: takes 1 xanax every night   Sexual activity: Not on file  Other Topics Concern   Not on file  Social History Narrative   Last updated: 02/13/2010   Married but separated from wife   Alcohol use-yes   Former Smoker quit 40 yrs ago (40 pack yr history)     Works part time at [employer] near [employer]   Social Determinants of Corporate investment banker Strain: Low Risk    Difficulty of Paying Living Expenses: Not very hard  Food Insecurity: No Food Insecurity   Worried About Programme researcher, broadcasting/film/video in the Last Year: Never true   Barista in the Last Year: Never true  Transportation Needs: No Transportation Needs   Lack of Transportation (Medical): No   Lack of Transportation (Non-Medical): No  Physical Activity: Inactive   Days of Exercise per Week: 0 days   Minutes of Exercise per Session: 0 min  Stress: No Stress Concern Present   Feeling of Stress : Not at all  Social Connections: Socially Isolated   Frequency of Communication with Friends and Family: More than three times a week   Frequency of Social Gatherings with Friends and Family: More than three times a week   Attends Religious Services: Never   Database administrator or Organizations: No   Attends Engineer, structural: Never   Marital Status: Separated    Tobacco Counseling Counseling given: Not Answered Tobacco comments: quit 40 years ago-40 pack year history   Clinical  Intake:  Pre-visit preparation completed: Yes  Pain : 0-10 Pain Type: Chronic pain Pain Location: Shoulder Pain Onset: More than a month ago Pain Frequency: Constant     Nutritional Status: BMI 25 -29 Overweight Nutritional Risks: None Diabetes: Yes CBG done?: No Did pt. bring in CBG monitor from home?: No  How often do you need to have someone help you when you read instructions, pamphlets, or other written materials from your doctor or pharmacy?: 1 - Never  Diabetes:  Is the patient diabetic?  Yes  If diabetic, was a CBG obtained today?  No  Did the patient bring in their glucometer from home?  No phone visit How often do you monitor your CBG's? never.   Financial Strains and Diabetes Management:  Are you having any financial strains with the device, your supplies or your medication? No .  Does the patient want to be seen by Chronic Care Management for management of their diabetes?  No  Would the patient like to be referred to a Nutritionist or for Diabetic Management?  No   Diabetic Exams:  Diabetic Eye Exam: Patient refused  Diabetic Foot Exam: Completed 09/10/2020.   Interpreter Needed?: No  Information entered by :: Thomasenia Sales LPN   Activities of Daily Living In your present state of health, do you have any difficulty performing the following activities: 01/28/2021 06/11/2020  Hearing? Malvin Johns  Vision? N N  Difficulty concentrating or making decisions? N Y  Walking or climbing stairs? N N  Dressing or bathing? N Y  Doing errands, shopping? N N  Preparing Food and eating ? N -  Using the Toilet? N -  In the past six months, have you accidently leaked urine? N -  Do you have problems with loss of bowel control? N -  Managing your Medications? N -  Managing your Finances? N -  Housekeeping or managing your Housekeeping? N -  Some recent data might be hidden    Patient Care Team: Sandford Craze, NP as PCP - General (Internal Medicine) Elvis Coil,  MD as Consulting Physician (Nephrology)  Indicate any recent Medical Services you may have received from other than [facility] providers in the past year (date may be approximate).     Assessment:   This is a routine wellness examination for Javier [name].  Hearing/Vision screen Hearing Screening - Comments:: C/o hearing loss-no hearing aids Vision Screening - Comments:: Refuses Eye exam  Dietary issues and exercise activities discussed: Current Exercise Habits: The patient does not participate in regular exercise at present, Exercise limited by: None identified   Goals Addressed             This Visit's Progress    just to keep living   On track      Depression Screen PHQ 2/9 Scores 01/28/2021 02/20/2020 12/14/2017 06/17/2016 04/07/2016 03/18/2016 12/17/2015  PHQ - 2 Score 0 0 0 0 0 0 0    Fall Risk Fall Risk  01/28/2021 12/11/2020 11/06/2019 12/14/2017 06/17/2016  Falls in the past year? 1 0 1 No No  Number falls in past yr: 0 0 1 - -  Injury with Fall? 0 0 1 - -  Risk for fall due to : History of fall(s) - - - -  Follow up Falls prevention discussed - - - -    FALL RISK PREVENTION PERTAINING TO THE HOME:  Any stairs in or around the home? No  Home free of loose throw rugs in walkways, pet beds, electrical cords, etc? Yes  Adequate lighting in your home to reduce risk of falls? Yes   ASSISTIVE DEVICES UTILIZED TO PREVENT FALLS:  Life alert? No  Use of a cane, walker or w/c? No  Grab bars in the bathroom? Yes  Shower chair or bench in shower? No  Elevated toilet seat or a handicapped toilet? No   TIMED UP AND GO:  Was the test performed? No . Phone visit   Cognitive Function:Normal cognitive status assessed by this Nurse Health Advisor. No abnormalities found.   MMSE - Mini Mental State Exam 04/07/2016  Orientation to time 5  Orientation to Place 5  Registration 3  Attention/ Calculation 5  Recall 2  Language- name 2 objects 2  Language- repeat 1  Language- follow 3  step command 3  Language- read & follow direction 1  Write a sentence 1  Copy design 0  Total score 28        Immunizations Immunization History  Administered Date(s) Administered   Fluad Quad(high Dose 65+) 02/20/2020, 12/18/2020   Influenza Split 01/05/2011, 12/28/2011   Influenza Whole 02/23/2006, 01/26/2008, 02/28/2009   Influenza, High Dose Seasonal PF 02/06/2013, 12/29/2016, 04/05/2018   Influenza,inj,Quad PF,6+ Mos 02/19/2014, 12/25/2014, 12/17/2015   Moderna SARS-COV2 Booster Vaccination 02/20/2020   Moderna Sars-Covid-2 Vaccination 05/17/2019, 06/15/2019   Pneumococcal Conjugate-13 05/01/2013   Pneumococcal Polysaccharide-23 01/26/2008   Tdap 11/22/2013   Zoster, Live 08/26/2009, 09/04/2009    TDAP status: Up to date  Flu Vaccine status: Up to date  Pneumococcal vaccine status: Up to date  Covid-19 vaccine status: Information provided on how to obtain vaccines. Booster due  Qualifies for Shingles Vaccine? Yes   Zostavax completed Yes   Shingrix Completed?: No.    Education has been provided regarding the importance of this vaccine. Patient has been advised to call insurance company to determine out of pocket expense if they have not yet received this vaccine. Advised may also receive vaccine at local pharmacy or Health Dept. Verbalized acceptance and understanding.  Screening Tests Health Maintenance  Topic Date Due   Zoster Vaccines- Shingrix (1 of 2) Never done   COVID-19 Vaccine (4 - Booster for Moderna series) 06/19/2020   URINE MICROALBUMIN  02/19/2021   HEMOGLOBIN A1C  06/13/2021   FOOT EXAM  09/10/2021   TETANUS/TDAP  11/23/2023   INFLUENZA VACCINE  Completed   HPV VACCINES  Aged Out   OPHTHALMOLOGY EXAM  Discontinued    Health Maintenance  Health Maintenance Due  Topic Date Due   Zoster Vaccines- Shingrix (1 of 2) Never done   COVID-19 Vaccine (4 - Booster for Moderna series) 06/19/2020   URINE MICROALBUMIN  02/19/2021    Colorectal cancer  screening: No longer required.   Lung Cancer Screening: (Low Dose CT Chest recommended if Age 35-80 years, 30 pack-year currently smoking OR have quit w/in 15years.) does not qualify.    Additional Screening:  Hepatitis C Screening: does not qualify  Vision Screening: Recommended annual ophthalmology exams for early detection of glaucoma and other disorders of the eye. Is the patient up to date with their annual eye exam?  No  Who is the provider or what is the name of the office in which the patient attends annual eye exams? none If pt is not established with a provider, would they like to be referred to a provider to establish care? No .   Dental Screening: Recommended annual dental exams for proper oral hygiene  Community Resource Referral / Chronic Care Management: CRR required this visit?  No   CCM required this visit?  No      Plan:     I have personally reviewed and noted the following in the patient's chart:   Medical and social history Use of alcohol, tobacco or illicit drugs  Current medications and supplements including opioid prescriptions. Patient is not currently taking opioid prescriptions. Functional ability and status Nutritional status Physical activity Advanced directives List of other physicians Hospitalizations, surgeries, and ER visits in previous 12 months Vitals Screenings to include cognitive, depression, and falls Referrals and appointments  In addition, I have reviewed and discussed with patient certain preventive protocols, quality metrics,  and best practice recommendations. A written personalized care plan for preventive services as well as general preventive health recommendations were provided to patient.   Due to this being a telephonic visit, the after visit summary with patients personalized plan was offered to patient via mail or my-chart. Patient declined at this time.     Roanna Raider, LPN   [id number]  Nurse Health  Advisor  Nurse Notes: None

## 2021-01-28 NOTE — Patient Instructions (Signed)
Javier [name] , Thank you for taking time to completed your Medicare Wellness Visit. I appreciate your ongoing commitment to your health goals. Please review the following plan we discussed and let me know if I can assist you in the future.   Screening recommendations/referrals: Colonoscopy: No longer required Recommended yearly ophthalmology/optometry visit for glaucoma screening and checkup Recommended yearly dental visit for hygiene and checkup  Vaccinations: Influenza vaccine: Up to date Pneumococcal vaccine: Up to date Tdap vaccine: Up to date-Due-11/23/2023 Shingles vaccine: Discuss with pharmacy   Covid-19: Booster available at the pharmacy  Advanced directives: Declined information today.  Conditions/risks identified: See problem list  Next appointment: Follow up in one year for your annual wellness visit.   Preventive Care 31 Years and Older, Male Preventive care refers to lifestyle choices and visits with your health care provider that can promote health and wellness. What does preventive care include? A yearly physical exam. This is also called an annual well check. Dental exams once or twice a year. Routine eye exams. Ask your health care provider how often you should have your eyes checked. Personal lifestyle choices, including: Daily care of your teeth and gums. Regular physical activity. Eating a healthy diet. Avoiding tobacco and drug use. Limiting alcohol use. Practicing safe sex. Taking low doses of aspirin every day. Taking vitamin and mineral supplements as recommended by your health care provider. What happens during an annual well check? The services and screenings done by your health care provider during your annual well check will depend on your age, overall health, lifestyle risk factors, and family history of disease. Counseling  Your health care provider may ask you questions about your: Alcohol use. Tobacco use. Drug use. Emotional well-being. Home  and relationship well-being. Sexual activity. Eating habits. History of falls. Memory and ability to understand (cognition). Work and work Astronomer. Screening  You may have the following tests or measurements: Height, weight, and BMI. Blood pressure. Lipid and cholesterol levels. These may be checked every 5 years, or more frequently if you are over 12 years old. Skin check. Lung cancer screening. You may have this screening every year starting at age 35 if you have a 30-pack-year history of smoking and currently smoke or have quit within the past 15 years. Fecal occult blood test (FOBT) of the stool. You may have this test every year starting at age 8. Flexible sigmoidoscopy or colonoscopy. You may have a sigmoidoscopy every 5 years or a colonoscopy every 10 years starting at age 57. Prostate cancer screening. Recommendations will vary depending on your family history and other risks. Hepatitis C blood test. Hepatitis B blood test. Sexually transmitted disease (STD) testing. Diabetes screening. This is done by checking your blood sugar (glucose) after you have not eaten for a while (fasting). You may have this done every 1-3 years. Abdominal aortic aneurysm (AAA) screening. You may need this if you are a current or former smoker. Osteoporosis. You may be screened starting at age 57 if you are at high risk. Talk with your health care provider about your test results, treatment options, and if necessary, the need for more tests. Vaccines  Your health care provider may recommend certain vaccines, such as: Influenza vaccine. This is recommended every year. Tetanus, diphtheria, and acellular pertussis (Tdap, Td) vaccine. You may need a Td booster every 10 years. Zoster vaccine. You may need this after age 42. Pneumococcal 13-valent conjugate (PCV13) vaccine. One dose is recommended after age 58. Pneumococcal polysaccharide (PPSV23) vaccine. One dose  is recommended after age 53. Talk to  your health care provider about which screenings and vaccines you need and how often you need them. This information is not intended to replace advice given to you by your health care provider. Make sure you discuss any questions you have with your health care provider. Document Released: 05/03/2015 Document Revised: 12/25/2015 Document Reviewed: 02/05/2015 Elsevier Interactive Patient Education  2017 [place] Prevention in the Home Falls can cause injuries. They can happen to people of all ages. There are many things you can do to make your home safe and to help prevent falls. What can I do on the outside of my home? Regularly fix the edges of walkways and driveways and fix any cracks. Remove anything that might make you trip as you walk through a door, such as a raised step or threshold. Trim any bushes or trees on the path to your home. Use bright outdoor lighting. Clear any walking paths of anything that might make someone trip, such as rocks or tools. Regularly check to see if handrails are loose or broken. Make sure that both sides of any steps have handrails. Any raised decks and porches should have guardrails on the edges. Have any leaves, snow, or ice cleared regularly. Use sand or salt on walking paths during winter. Clean up any spills in your garage right away. This includes oil or grease spills. What can I do in the bathroom? Use night lights. Install grab bars by the toilet and in the tub and shower. Do not use towel bars as grab bars. Use non-skid mats or decals in the tub or shower. If you need to sit down in the shower, use a plastic, non-slip stool. Keep the floor dry. Clean up any water that spills on the floor as soon as it happens. Remove soap buildup in the tub or shower regularly. Attach bath mats securely with double-sided non-slip rug tape. Do not have throw rugs and other things on the floor that can make you trip. What can I do in the bedroom? Use  night lights. Make sure that you have a light by your bed that is easy to reach. Do not use any sheets or blankets that are too big for your bed. They should not hang down onto the floor. Have a firm chair that has side arms. You can use this for support while you get dressed. Do not have throw rugs and other things on the floor that can make you trip. What can I do in the kitchen? Clean up any spills right away. Avoid walking on wet floors. Keep items that you use a lot in easy-to-reach places. If you need to reach something above you, use a strong step stool that has a grab bar. Keep electrical cords out of the way. Do not use floor polish or wax that makes floors slippery. If you must use wax, use non-skid floor wax. Do not have throw rugs and other things on the floor that can make you trip. What can I do with my stairs? Do not leave any items on the stairs. Make sure that there are handrails on both sides of the stairs and use them. Fix handrails that are broken or loose. Make sure that handrails are as long as the stairways. Check any carpeting to make sure that it is firmly attached to the stairs. Fix any carpet that is loose or worn. Avoid having throw rugs at the top or bottom of the stairs.  If you do have throw rugs, attach them to the floor with carpet tape. Make sure that you have a light switch at the top of the stairs and the bottom of the stairs. If you do not have them, ask someone to add them for you. What else can I do to help prevent falls? Wear shoes that: Do not have high heels. Have rubber bottoms. Are comfortable and fit you well. Are closed at the toe. Do not wear sandals. If you use a stepladder: Make sure that it is fully opened. Do not climb a closed stepladder. Make sure that both sides of the stepladder are locked into place. Ask someone to hold it for you, if possible. Clearly mark and make sure that you can see: Any grab bars or handrails. First and last  steps. Where the edge of each step is. Use tools that help you move around (mobility aids) if they are needed. These include: Canes. Walkers. Scooters. Crutches. Turn on the lights when you go into a dark area. Replace any light bulbs as soon as they burn out. Set up your furniture so you have a clear path. Avoid moving your furniture around. If any of your floors are uneven, fix them. If there are any pets around you, be aware of where they are. Review your medicines with your doctor. Some medicines can make you feel dizzy. This can increase your chance of falling. Ask your doctor what other things that you can do to help prevent falls. This information is not intended to replace advice given to you by your health care provider. Make sure you discuss any questions you have with your health care provider. Document Released: 01/31/2009 Document Revised: 09/12/2015 Document Reviewed: 05/11/2014 Elsevier Interactive Patient Education  2017 [employer].

## 2021-01-29 NOTE — Telephone Encounter (Signed)
Patient advised of provider's comments that he should not be taking this medication. He insists that Xanax is the only thing that works for him and he will continue to buy "off the streets"

## 2021-02-17 ENCOUNTER — Other Ambulatory Visit: Payer: Self-pay | Admitting: Family

## 2021-02-19 ENCOUNTER — Other Ambulatory Visit: Payer: Self-pay | Admitting: Family

## 2021-04-02 ENCOUNTER — Ambulatory Visit: Payer: Medicare (Managed Care) | Admitting: Family

## 2021-04-02 DIAGNOSIS — I5032 Chronic diastolic (congestive) heart failure: Secondary | ICD-10-CM

## 2021-04-02 DIAGNOSIS — E118 Type 2 diabetes mellitus with unspecified complications: Secondary | ICD-10-CM

## 2021-04-02 DIAGNOSIS — M1A9XX0 Chronic gout, unspecified, without tophus (tophi): Secondary | ICD-10-CM

## 2021-04-02 DIAGNOSIS — G4733 Obstructive sleep apnea (adult) (pediatric): Secondary | ICD-10-CM

## 2021-04-02 DIAGNOSIS — N183 Chronic kidney disease, stage 3 unspecified: Secondary | ICD-10-CM

## 2021-04-02 DIAGNOSIS — E782 Mixed hyperlipidemia: Secondary | ICD-10-CM | POA: Diagnosis not present

## 2021-04-02 LAB — BASIC METABOLIC PANEL
BUN: 34 mg/dL — ABNORMAL HIGH (ref 6–23)
CO2: 31 mEq/L (ref 19–32)
Calcium: 9.6 mg/dL (ref 8.4–10.5)
Chloride: 104 mEq/L (ref 96–112)
Creatinine, Ser: 1.63 mg/dL — ABNORMAL HIGH (ref 0.40–1.50)
GFR: 37.18 mL/min — ABNORMAL LOW (ref >60.00)
Glucose, Bld: 85 mg/dL (ref 70–99)
Potassium: 4.1 mEq/L (ref 3.5–5.1)
Sodium: 141 mEq/L (ref 135–145)

## 2021-04-02 NOTE — Progress Notes (Signed)
Subjective:   By signing my name below, I, Shehryar Baig, attest that this documentation has been prepared under the direction and in the presence of Sandford Craze NP. 04/02/2021    Patient ID: Javier [name], male    DOB: 1929-06-28, 87 y.o.   MRN: [id number]  Chief Complaint  Patient presents with   Diabetes    Here For 3 Month Follow Up     Diabetes  Patient is in today for a office visit.   Diabetes- He is managing his diabetes well at this time. He reports since recovering from Covid-19 he lost the ability to smell and taste. He also notes losing most mobility in her 3rd and 4th digits of his left hand. He also notes losing weight since then and thinks it has helped control his diabetes.   Lab Results  Component Value Date   HGBA1C 5.7 12/11/2020   Lasix- He continues taking lasix daily PO. He has no swelling in her lower legs at this time. His weight is stable at this time.  Cholesterol- He continues taking 40 mg lovastatin daily PO.  Lab Results  Component Value Date   CHOL 125 09/10/2020   HDL 53.90 09/10/2020   LDLCALC 58 09/10/2020   LDLDIRECT 53.0 12/25/2014   TRIG 63.0 09/10/2020   CHOLHDL 2 09/10/2020   Gout- He continues taking 100 mg allopurinol daily PO. Allergies- He does not take Claritin daily PO anymore.  Sleep apnea- He does not wear a CPAP machine at night to manage his sleep apnea. He is not interested in wearing one to manage it. He wakes up feeling rested every morning.  Immunizations- He is UTD on flu vaccines. He was informed of the bivalent Covid-19 vaccine and is interested in receiving it at his pharmacy.    Health Maintenance Due  Topic Date Due   Zoster Vaccines- Shingrix (1 of 2) Never done   COVID-19 Vaccine (3 - Booster for Moderna series) 04/16/2020   URINE MICROALBUMIN  02/19/2021    Past Medical History:  Diagnosis Date   Atrial fibrillation (HCC)    Bronchitis    CAD (coronary artery disease)    Chronic renal  insufficiency    Congestive heart failure (HCC)    Diabetes mellitus type II    GERD (gastroesophageal reflux disease)    Gout    Hyperlipidemia    Hypertension    Leg cramps    Osteoarthritis    Shingles     Past Surgical History:  Procedure Laterality Date   CARDIOVERSION  11/02/2005   s/p   CORONARY ARTERY BYPASS GRAFT  05/18/2005   EYE SURGERY     CATARACT SX 06/2015 both eyes Dr.Beavis per pt   EYE SURGERY Bilateral 12/09/2016   revision of cataract surgery from 2017.   MITRAL VALVE REPAIR  04/2005   s/p mitral valve repair    Family History  Problem Relation Age of Onset   Diabetes Other        siblings   Melanoma Brother        died at 61    Social History   Socioeconomic History   Marital status: Legally Separated    Spouse name: Not on file   Number of children: Not on file   Years of education: Not on file   Highest education level: Not on file  Occupational History   Not on file  Tobacco Use   Smoking status: Former   Smokeless tobacco: Never   Tobacco  comments:    quit 40 years ago-40 pack year history  Vaping Use   Vaping Use: Never used  Substance and Sexual Activity   Alcohol use: Yes    Comment: 1 beer daily   Drug use: Yes    Types: Benzodiazepines    Comment: takes 1 xanax every night   Sexual activity: Not on file  Other Topics Concern   Not on file  Social History Narrative   Last updated: 02/13/2010   Married but separated from wife   Alcohol use-yes   Former Smoker quit 40 yrs ago (40 pack yr history)     Works part time at [employer] near [employer]   Social Determinants of Corporate investment banker Strain: Low Risk    Difficulty of Paying Living Expenses: Not very hard  Food Insecurity: No Food Insecurity   Worried About Programme researcher, broadcasting/film/video in the Last Year: Never true   Barista in the Last Year: Never true  Transportation Needs: No Transportation Needs   Lack of Transportation (Medical): No   Lack  of Transportation (Non-Medical): No  Physical Activity: Inactive   Days of Exercise per Week: 0 days   Minutes of Exercise per Session: 0 min  Stress: No Stress Concern Present   Feeling of Stress : Not at all  Social Connections: Socially Isolated   Frequency of Communication with Friends and Family: More than three times a week   Frequency of Social Gatherings with Friends and Family: More than three times a week   Attends Religious Services: Never   Database administrator or Organizations: No   Attends Engineer, structural: Never   Marital Status: Separated  Catering manager Violence: Not At Risk   Fear of Current or Ex-Partner: No   Emotionally Abused: No   Physically Abused: No   Sexually Abused: No    Outpatient Medications Prior to Visit  Medication Sig Dispense Refill   allopurinol (ZYLOPRIM) 100 MG tablet TAKE ONE TABLET BY MOUTH IN THE MORNING 90 tablet 1   ASPIRIN LOW DOSE 81 MG EC tablet TAKE ONE TABLET BY MOUTH IN THE MORNING 90 tablet 1   betamethasone valerate (VALISONE) 0.1 % cream APPLY TO AFFECTED AREA(S) TOPICALLY DAILY AS NEEDED FOR ITCHING OR REDNESS 45 g 1   carvedilol (COREG) 3.125 MG tablet TAKE ONE TABLET BY MOUTH IN THE MORNING AND IN THE EVENING 180 tablet 1   colchicine 0.6 MG tablet TAKE TWO TABLETS BY MOUTH DAILY (AT THE START OF GOUT SYMPTOMS) and THEN TAKE ONE TABLET BY MOUTH ONE HOUR LATER AS NEEDED 15 tablet 1   folic acid (FOLVITE) 1 MG tablet TAKE ONE TABLET BY MOUTH IN THE MORNING 90 tablet 1   furosemide (LASIX) 20 MG tablet TAKE ONE TABLET BY MOUTH IN THE MORNING 90 tablet 1   loratadine (CLARITIN) 10 MG tablet TAKE ONE TABLET BY MOUTH IN THE MORNING 90 tablet 1   lovastatin (MEVACOR) 40 MG tablet TAKE ONE TABLET BY MOUTH IN THE MORNING 90 tablet 1   Multiple Vitamins-Minerals (MULTIVITAMIN WITH MINERALS) tablet Take 1 tablet by mouth daily.     omega-3 fish oil (MAXEPA) 1000 MG CAPS capsule TAKE ONE CAPSULE BY MOUTH IN THE MORNING AND  IN THE EVENING 180 capsule 1   potassium chloride (KLOR-CON) 10 MEQ tablet TAKE ONE TABLET BY MOUTH IN THE MORNING 90 tablet 1   No facility-administered medications prior to visit.  No Known Allergies  ROS     Objective:    Physical Exam Constitutional:      General: He is not in acute distress.    Appearance: Normal appearance. He is not ill-appearing.  HENT:     Head: Normocephalic and atraumatic.     Right Ear: External ear normal.     Left Ear: External ear normal.  Eyes:     Extraocular Movements: Extraocular movements intact.     Pupils: Pupils are equal, round, and reactive to light.  Cardiovascular:     Rate and Rhythm: Normal rate and regular rhythm.     Heart sounds: Normal heart sounds. No murmur heard.   No gallop.  Pulmonary:     Effort: Pulmonary effort is normal. No respiratory distress.     Breath sounds: Normal breath sounds. No wheezing or rales.  Musculoskeletal:     Right lower leg: 1+ Edema present.     Left lower leg: 1+ Edema present.  Skin:    General: Skin is warm and dry.  Neurological:     Mental Status: He is alert and oriented to person, place, and time.  Psychiatric:        Behavior: Behavior normal.        Judgment: Judgment normal.    BP (!) 126/52 (BP Location: Right Arm, Patient Position: Sitting, Cuff Size: Small)    Pulse 60    Temp 98.4 F (36.9 C) (Oral)    Resp 16    Ht 5\' 7"  (1.702 m)    Wt 156 lb 3.2 oz (70.9 kg)    SpO2 100%    BMI 24.46 kg/m  Wt Readings from Last 3 Encounters:  04/02/21 156 lb 3.2 oz (70.9 kg)  01/28/21 159 lb (72.1 kg)  01/01/21 159 lb (72.1 kg)       Assessment & Plan:   Problem List Items Addressed This Visit       Unprioritized   SLEEP APNEA, OBSTRUCTIVE    He declined cpap previously.  States he wakes up feeling rested.  He has lost a considerable amount of weight.       HYPERLIPIDEMIA    Lab Results  Component Value Date   CHOL 125 09/10/2020   HDL 53.90 09/10/2020   LDLCALC 58  09/10/2020   LDLDIRECT 53.0 12/25/2014   TRIG 63.0 09/10/2020   CHOLHDL 2 09/10/2020  Stable, continue lovastatin.        Gout    Stable with daily allopurinol 100mg . Continue same.       Diabetes mellitus type 2, controlled, with complications [facility])    Lab Results  Component Value Date   HGBA1C 5.7 12/11/2020   HGBA1C 5.3 09/10/2020   HGBA1C 5.5 02/20/2020   Lab Results  Component Value Date   MICROALBUR 0.3 02/20/2020   LDLCALC 58 09/10/2020   CREATININE 1.78 (H) 12/11/2020  Stable with diabetic diet.       Chronic kidney disease    Check bmet       Relevant Orders   Basic metabolic panel   Chronic diastolic CHF (congestive heart failure) (HCC)    Wt Readings from Last 3 Encounters:  04/02/21 156 lb 3.2 oz (70.9 kg)  01/28/21 159 lb (72.1 kg)  01/01/21 159 lb (72.1 kg)  Clinically stable on lasix 20mg  once daily.         No orders of the defined types were placed in this encounter.   I, 03/30/21 NP, personally preformed the  services described in this documentation.  All medical record entries made by the scribe were at my direction and in my presence.  I have reviewed the chart and discharge instructions (if applicable) and agree that the record reflects my personal performance and is accurate and complete. 04/02/2021   I,Shehryar Baig,acting as a Neurosurgeon for Lemont Fillers, NP.,have documented all relevant documentation on the behalf of Lemont Fillers, NP,as directed by  Lemont Fillers, NP while in the presence of Lemont Fillers, NP.   Lemont Fillers, NP

## 2021-04-02 NOTE — Assessment & Plan Note (Signed)
He declined cpap previously.  States he wakes up feeling rested.  He has lost a considerable amount of weight.

## 2021-04-02 NOTE — Assessment & Plan Note (Signed)
Check bmet 

## 2021-04-02 NOTE — Assessment & Plan Note (Addendum)
Lab Results  Component Value Date   HGBA1C 5.7 12/11/2020   HGBA1C 5.3 09/10/2020   HGBA1C 5.5 02/20/2020   Lab Results  Component Value Date   MICROALBUR 0.3 02/20/2020   LDLCALC 58 09/10/2020   CREATININE 1.78 (H) 12/11/2020   Stable with diabetic diet.

## 2021-04-02 NOTE — Assessment & Plan Note (Signed)
Lab Results  Component Value Date   CHOL 125 09/10/2020   HDL 53.90 09/10/2020   LDLCALC 58 09/10/2020   LDLDIRECT 53.0 12/25/2014   TRIG 63.0 09/10/2020   CHOLHDL 2 09/10/2020   Stable, continue lovastatin.

## 2021-04-02 NOTE — Patient Instructions (Addendum)
Please get your covid booster downstairs at the pharmacy.  Complete lab work prior to leaving.

## 2021-04-02 NOTE — Assessment & Plan Note (Signed)
Wt Readings from Last 3 Encounters:  04/02/21 156 lb 3.2 oz (70.9 kg)  01/28/21 159 lb (72.1 kg)  01/01/21 159 lb (72.1 kg)   Clinically stable on lasix 20mg  once daily.

## 2021-04-02 NOTE — Assessment & Plan Note (Signed)
Stable with daily allopurinol 100mg . Continue same.

## 2021-04-04 ENCOUNTER — Encounter: Payer: Self-pay | Admitting: Family

## 2021-04-04 NOTE — Progress Notes (Signed)
Mailed out to patient 

## 2021-04-16 ENCOUNTER — Other Ambulatory Visit: Payer: Self-pay | Admitting: Family

## 2021-05-13 ENCOUNTER — Other Ambulatory Visit: Payer: Self-pay | Admitting: Family

## 2021-05-16 ENCOUNTER — Other Ambulatory Visit: Payer: Self-pay | Admitting: Family

## 2021-05-16 NOTE — Telephone Encounter (Signed)
Received request for amlodipine.  My notes show that this med was discontinued. Has he still been taking?  How has his bp been running?

## 2021-06-12 ENCOUNTER — Other Ambulatory Visit: Payer: Self-pay | Admitting: Family

## 2021-06-17 ENCOUNTER — Other Ambulatory Visit: Payer: Self-pay | Admitting: Family

## 2021-07-01 ENCOUNTER — Ambulatory Visit (INDEPENDENT_AMBULATORY_CARE_PROVIDER_SITE_OTHER): Payer: Medicare (Managed Care) | Admitting: Family

## 2021-07-01 ENCOUNTER — Telehealth: Payer: Self-pay | Admitting: Family

## 2021-07-01 VITALS — BP 112/57 | HR 70 | Temp 97.7°F | Resp 16 | Wt 154.0 lb

## 2021-07-01 DIAGNOSIS — N183 Chronic kidney disease, stage 3 unspecified: Secondary | ICD-10-CM

## 2021-07-01 DIAGNOSIS — E118 Type 2 diabetes mellitus with unspecified complications: Secondary | ICD-10-CM

## 2021-07-01 DIAGNOSIS — I10 Essential (primary) hypertension: Secondary | ICD-10-CM | POA: Diagnosis not present

## 2021-07-01 DIAGNOSIS — H6690 Otitis media, unspecified, unspecified ear: Secondary | ICD-10-CM

## 2021-07-01 DIAGNOSIS — G5601 Carpal tunnel syndrome, right upper limb: Secondary | ICD-10-CM

## 2021-07-01 DIAGNOSIS — H60501 Unspecified acute noninfective otitis externa, right ear: Secondary | ICD-10-CM

## 2021-07-01 LAB — COMPREHENSIVE METABOLIC PANEL
ALT: 7 U/L (ref 0–53)
AST: 14 U/L (ref 0–37)
Albumin: 4 g/dL (ref 3.5–5.2)
Alkaline Phosphatase: 71 U/L (ref 39–117)
BUN: 30 mg/dL — ABNORMAL HIGH (ref 6–23)
CO2: 28 mEq/L (ref 19–32)
Calcium: 9.4 mg/dL (ref 8.4–10.5)
Chloride: 106 mEq/L (ref 96–112)
Creatinine, Ser: 1.51 mg/dL — ABNORMAL HIGH (ref 0.40–1.50)
GFR: 40.68 mL/min — ABNORMAL LOW (ref >60.00)
Glucose, Bld: 90 mg/dL (ref 70–99)
Potassium: 4.2 mEq/L (ref 3.5–5.1)
Sodium: 142 mEq/L (ref 135–145)
Total Bilirubin: 0.8 mg/dL (ref 0.2–1.2)
Total Protein: 6.8 g/dL (ref 6.0–8.3)

## 2021-07-01 LAB — HEMOGLOBIN A1C: Hgb A1c MFr Bld: 5.5 % (ref 4.6–6.5)

## 2021-07-01 LAB — MICROALBUMIN / CREATININE URINE RATIO
Creatinine,U: 40 mg/dL
Microalb Creat Ratio: 1.8 mg/g (ref 0.0–30.0)
Microalb, Ur: <0.7 mg/dL (ref 0.0–1.9)

## 2021-07-01 MED ORDER — AMOXICILLIN 500 MG PO CAPS: 500.0000 mg | ORAL_CAPSULE | Freq: Three times a day (TID) | ORAL | 0 refills | Status: AC

## 2021-07-01 MED ORDER — CIPRO HC 0.2-1 % OT SUSP: 3.0000 [drp] | Freq: Two times a day (BID) | OTIC | 0 refills | Status: AC

## 2021-07-01 NOTE — Telephone Encounter (Signed)
Can you please call [facility] and ask them what medication is packaged in his PM dosing package?  ?

## 2021-07-01 NOTE — Assessment & Plan Note (Addendum)
BP Readings from Last 3 Encounters:  ?07/01/21 (!) 112/57  ?04/02/21 (!) 126/52  ?01/01/21 118/60  ? ?BP looks good. Will check with his packaging pharmacy to see what the PM pill is in his pill pack.  ?

## 2021-07-01 NOTE — Assessment & Plan Note (Signed)
See phone note 3/14 re: nephrology consult. Check renal function.  ?

## 2021-07-01 NOTE — Patient Instructions (Signed)
Please get your Radio producer. ?Start amoxicillin for ear infection (pills) as well as ear drops (3 drops right ear twice daily). Call if drainage worsens or if it does not improve.  ?

## 2021-07-01 NOTE — Telephone Encounter (Signed)
Is he still seeing nephrology? If not, would he like me to get him set up with someone closer to home for his kidneys?  ?

## 2021-07-01 NOTE — Assessment & Plan Note (Signed)
rx with ciloxin HC drops. ?

## 2021-07-01 NOTE — Assessment & Plan Note (Signed)
rx with amoxicillin.   ?

## 2021-07-01 NOTE — Progress Notes (Signed)
? ?Subjective:  ? ?By signing my name below, I, Carylon Perches, attest that this documentation has been prepared under the direction and in the presence of Debbrah Alar NP, 07/01/2021 ? ? Patient ID: Javier [name], male    DOB: 06-18-1929, 88 y.o.   MRN: [id number] ? ?Chief Complaint  ?Patient presents with  ? Diabetes  ?  Here for follow up  ? Hypertension  ?  Here for follow up  ? ? ?HPI ?Patient is in today for an office visit. Patient is with a male roommate who helps to care for him.  ? ?Coldness/Chills - Patient complains of always being cold.  ? ?Numbness - He  complains of numbness in his right fingers on his right hand. Often drops things from his right hand.  ? ?Ear Discharge - He complains of right ear discharge. His symptoms have relapsed occasionally as of 3 years ago. States that he ruptured his ear drum when he fell 3 years ago.   ? ?Blood Pressure - He has his meds pre-packaged.  Not taking the PM pill, thinks it is a bp med.  ?BP Readings from Last 3 Encounters:  ?07/01/21 (!) 112/57  ?04/02/21 (!) 126/52  ?01/01/21 118/60  ? ? ?Health Maintenance Due  ?Topic Date Due  ? Zoster Vaccines- Shingrix (1 of 2) Never done  ? COVID-19 Vaccine (3 - Booster for Moderna series) 04/16/2020  ? URINE MICROALBUMIN  02/19/2021  ? HEMOGLOBIN A1C  06/13/2021  ? ? ?Past Medical History:  ?Diagnosis Date  ? Atrial fibrillation ([place])   ? Bronchitis   ? CAD (coronary artery disease)   ? Chronic renal insufficiency   ? Congestive heart failure ([place])   ? Diabetes mellitus type II   ? GERD (gastroesophageal reflux disease)   ? Gout   ? Hyperlipidemia   ? Hypertension   ? Leg cramps   ? Osteoarthritis   ? Shingles   ? ? ?Past Surgical History:  ?Procedure Laterality Date  ? CARDIOVERSION  11/02/2005  ? s/p  ? CORONARY ARTERY BYPASS GRAFT  05/18/2005  ? EYE SURGERY    ? CATARACT SX 06/2015 both eyes Dr.Beavis per pt  ? EYE SURGERY Bilateral 12/09/2016  ? revision of cataract surgery from 2017.  ? MITRAL VALVE REPAIR   04/2005  ? s/p mitral valve repair  ? ? ?Family History  ?Problem Relation Age of Onset  ? Diabetes Other   ?     siblings  ? Melanoma Brother   ?     died at 44  ? ? ?Social History  ? ?Socioeconomic History  ? Marital status: Legally Separated  ?  Spouse name: Not on file  ? Number of children: Not on file  ? Years of education: Not on file  ? Highest education level: Not on file  ?Occupational History  ? Not on file  ?Tobacco Use  ? Smoking status: Former  ? Smokeless tobacco: Never  ? Tobacco comments:  ?  quit 40 years ago-40 pack year history  ?Vaping Use  ? Vaping Use: Never used  ?Substance and Sexual Activity  ? Alcohol use: Yes  ?  Comment: 1 beer daily  ? Drug use: Yes  ?  Types: Benzodiazepines  ?  Comment: takes 1 xanax every night  ? Sexual activity: Not on file  ?Other Topics Concern  ? Not on file  ?Social History Narrative  ? Last updated: 02/13/2010  ? Married but separated from wife  ? Alcohol  use-yes  ? Former Smoker quit 40 yrs ago (40 pack yr history)    ? Works part time at Insurance account manager near [employer]  ? ?Social Determinants of Health  ? ?Financial Resource Strain: Low Risk   ? Difficulty of Paying Living Expenses: Not very hard  ?Food Insecurity: No Food Insecurity  ? Worried About Charity fundraiser in the Last Year: Never true  ? Ran Out of Food in the Last Year: Never true  ?Transportation Needs: No Transportation Needs  ? Lack of Transportation (Medical): No  ? Lack of Transportation (Non-Medical): No  ?Physical Activity: Inactive  ? Days of Exercise per Week: 0 days  ? Minutes of Exercise per Session: 0 min  ?Stress: No Stress Concern Present  ? Feeling of Stress : Not at all  ?Social Connections: Socially Isolated  ? Frequency of Communication with Friends and Family: More than three times a week  ? Frequency of Social Gatherings with Friends and Family: More than three times a week  ? Attends Religious Services: Never  ? Active Member of Clubs or Organizations: No  ?  Attends Archivist Meetings: Never  ? Marital Status: Separated  ?Intimate Partner Violence: Not At Risk  ? Fear of Current or Ex-Partner: No  ? Emotionally Abused: No  ? Physically Abused: No  ? Sexually Abused: No  ? ? ?Outpatient Medications Prior to Visit  ?Medication Sig Dispense Refill  ? allopurinol (ZYLOPRIM) 100 MG tablet TAKE ONE TABLET BY MOUTH EVERY MORNING 90 tablet 1  ? ASPIRIN LOW DOSE 81 MG EC tablet TAKE ONE TABLET BY MOUTH EVERY MORNING 90 tablet 2  ? carvedilol (COREG) 3.125 MG tablet TAKE ONE TABLET BY MOUTH TWICE DAILY 180 tablet 1  ? folic acid (FOLVITE) 1 MG tablet TAKE ONE TABLET BY MOUTH EVERY MORNING 90 tablet 1  ? furosemide (LASIX) 20 MG tablet TAKE ONE TABLET BY MOUTH EVERY MORNING 90 tablet 1  ? loratadine (CLARITIN) 10 MG tablet TAKE ONE TABLET BY MOUTH EVERY MORNING 90 tablet 1  ? lovastatin (MEVACOR) 40 MG tablet TAKE ONE TABLET BY MOUTH EVERY MORNING 90 tablet 1  ? Multiple Vitamins-Minerals (MULTIVITAMIN WITH MINERALS) tablet Take 1 tablet by mouth daily.    ? omega-3 fish oil (MAXEPA) 1000 MG CAPS capsule TAKE ONE CAPSULE BY MOUTH EVERY MORNING AND IN THE EVENING 180 capsule 1  ? potassium chloride (KLOR-CON) 10 MEQ tablet TAKE ONE TABLET BY MOUTH EVERY MORNING 90 tablet 1  ? betamethasone valerate (VALISONE) 0.1 % cream APPLY TO AFFECTED AREA(S) TOPICALLY DAILY AS NEEDED FOR ITCHING OR REDNESS 45 g 1  ? colchicine 0.6 MG tablet TAKE TWO TABLETS BY MOUTH EVERY DAY AT THE START OF GOUT symptoms and TAKE ONE TABLET BY MOUTH ONE HOUR LATER AS NEEDED 15 tablet 1  ? ?No facility-administered medications prior to visit.  ? ? ?No Known Allergies ? ?Review of Systems  ?Constitutional:  Positive for chills.  ?HENT:  Positive for ear discharge.   ?Neurological:  Positive for weakness (Right fingers of right hand).  ? ?   ?Objective:  ?  ?Physical Exam ?Constitutional:   ?   General: He is not in acute distress. ?   Appearance: Normal appearance. He is not ill-appearing.  ?HENT:  ?    Head: Normocephalic and atraumatic.  ?   Right Ear: External ear normal. Drainage present.  ?   Left Ear: External ear normal.  ?   Ears:  ?  Comments: (+) Dull TM ?(+) Purulence behind eardrum ?Eyes:  ?   Extraocular Movements: Extraocular movements intact.  ?   Pupils: Pupils are equal, round, and reactive to light.  ?Cardiovascular:  ?   Rate and Rhythm: Normal rate and regular rhythm.  ?   Heart sounds: Normal heart sounds. No murmur heard. ?  No gallop.  ?Pulmonary:  ?   Effort: Pulmonary effort is normal. No respiratory distress.  ?   Breath sounds: Normal breath sounds. No wheezing or rales.  ?Musculoskeletal:  ?   Right hand: Decreased range of motion (Thenar wasting of right hand).  ?   Right lower leg: 1+ Edema present.  ?   Left lower leg: 1+ Edema present.  ?   Comments: Decreased sensation to light touch of right 1st second, third fingers.   ?Skin: ?   General: Skin is warm and dry.  ?Neurological:  ?   Mental Status: He is alert and oriented to person, place, and time.  ?Psychiatric:     ?   Mood and Affect: Mood normal.     ?   Behavior: Behavior normal.     ?   Judgment: Judgment normal.  ? ? ?BP (!) 112/57 (BP Location: Right Arm, Patient Position: Sitting, Cuff Size: Small)   Pulse 70   Temp 97.7 ?F (36.5 ?C) (Oral)   Resp 16   Wt 154 lb (69.9 kg)   SpO2 99%   BMI 24.12 kg/m?  ?Wt Readings from Last 3 Encounters:  ?07/01/21 154 lb (69.9 kg)  ?04/02/21 156 lb 3.2 oz (70.9 kg)  ?01/28/21 159 lb (72.1 kg)  ? ? ?   ?Assessment & Plan:  ? ?Problem List Items Addressed This Visit   ? ?  ? Unprioritized  ? Subacute otitis media  ?  rx with amoxicillin.   ?  ?  ? Relevant Medications  ? amoxicillin (AMOXIL) 500 MG capsule  ? Essential hypertension  ?  BP Readings from Last 3 Encounters:  ?07/01/21 (!) 112/57  ?04/02/21 (!) 126/52  ?01/01/21 118/60  ?BP looks good. Will check with his packaging pharmacy to see what the PM pill is in his pill pack.  ?  ?  ? Relevant Orders  ? Comp Met (CMET)  ?  Diabetes mellitus type 2, controlled, with complications ([place])  ? Relevant Orders  ? Comp Met (CMET)  ? Hemoglobin A1c  ? Urine Microalbumin w/creat. ratio  ? Chronic kidney disease  ?  See phone note 3/14 re: nephrol

## 2021-07-01 NOTE — Assessment & Plan Note (Signed)
Appears severe. Will refer to orthopedics near his him. ?

## 2021-07-02 ENCOUNTER — Encounter: Payer: Self-pay | Admitting: Family

## 2021-07-02 NOTE — Progress Notes (Signed)
Mailed out to patient 

## 2021-07-02 NOTE — Telephone Encounter (Signed)
[facility] he only gets carvedilol 3.125 for his evening pack. He takes other 8 meds in his morning pack.  ?

## 2021-07-03 NOTE — Telephone Encounter (Signed)
Patient advised he can take the medication and advised of what it is.  ?

## 2021-07-03 NOTE — Telephone Encounter (Signed)
Please advise pt OK to take the PM pill. It is a low dose blood pressure medication/  ?

## 2021-07-10 ENCOUNTER — Other Ambulatory Visit: Payer: Self-pay | Admitting: Family

## 2021-07-10 DIAGNOSIS — H60501 Unspecified acute noninfective otitis externa, right ear: Secondary | ICD-10-CM

## 2021-08-18 DEATH — deceased

## 2021-10-01 ENCOUNTER — Ambulatory Visit: Payer: Medicare (Managed Care) | Admitting: Family

## 2022-12-11 ENCOUNTER — Telehealth: Payer: Self-pay | Admitting: Family

## 2022-12-11 NOTE — Telephone Encounter (Signed)
Patient last seen 03.2023- left voicemail to schedule annual physical
